# Patient Record
Sex: Male | Born: 1950 | Race: Black or African American | Hispanic: No | Marital: Married | Smoking: Never smoker
Health system: Southern US, Community
[De-identification: ages and names within clinical notes are randomized; demographics above are authoritative.]

## PROBLEM LIST (undated history)

## (undated) DIAGNOSIS — L738 Other specified follicular disorders: Secondary | ICD-10-CM

## (undated) DIAGNOSIS — D649 Anemia, unspecified: Secondary | ICD-10-CM

## (undated) DIAGNOSIS — R945 Abnormal results of liver function studies: Secondary | ICD-10-CM

## (undated) DIAGNOSIS — J309 Allergic rhinitis, unspecified: Secondary | ICD-10-CM

## (undated) DIAGNOSIS — D869 Sarcoidosis, unspecified: Secondary | ICD-10-CM

## (undated) DIAGNOSIS — I10 Essential (primary) hypertension: Secondary | ICD-10-CM

## (undated) DIAGNOSIS — N401 Enlarged prostate with lower urinary tract symptoms: Secondary | ICD-10-CM

## (undated) DIAGNOSIS — E010 Iodine-deficiency related diffuse (endemic) goiter: Secondary | ICD-10-CM

## (undated) DIAGNOSIS — M5010 Cervical disc disorder with radiculopathy, unspecified cervical region: Secondary | ICD-10-CM

## (undated) DIAGNOSIS — M1990 Unspecified osteoarthritis, unspecified site: Secondary | ICD-10-CM

## (undated) DIAGNOSIS — N400 Enlarged prostate without lower urinary tract symptoms: Secondary | ICD-10-CM

## (undated) DIAGNOSIS — N529 Male erectile dysfunction, unspecified: Secondary | ICD-10-CM

## (undated) DIAGNOSIS — R351 Nocturia: Secondary | ICD-10-CM

## (undated) DIAGNOSIS — N138 Other obstructive and reflux uropathy: Secondary | ICD-10-CM

## (undated) DIAGNOSIS — M5430 Sciatica, unspecified side: Secondary | ICD-10-CM

## (undated) DIAGNOSIS — J189 Pneumonia, unspecified organism: Secondary | ICD-10-CM

## (undated) HISTORY — DX: Anemia, unspecified: D64.9

## (undated) HISTORY — DX: Pneumonia, unspecified organism: J18.9

## (undated) HISTORY — DX: Other specified follicular disorders: L73.8

## (undated) HISTORY — DX: Allergic rhinitis, unspecified: J30.9

## (undated) HISTORY — DX: Sarcoidosis, unspecified: D86.9

## (undated) HISTORY — DX: Cervical disc disorder with radiculopathy, unspecified cervical region: M50.10

## (undated) HISTORY — DX: Benign prostatic hyperplasia with lower urinary tract symptoms: N40.1

## (undated) HISTORY — PX: FOOT SURGERY: SHX648

## (undated) HISTORY — DX: Sciatica, unspecified side: M54.30

## (undated) HISTORY — DX: Unspecified osteoarthritis, unspecified site: M19.90

## (undated) HISTORY — DX: Abnormal results of liver function studies: R94.5

## (undated) HISTORY — DX: Benign prostatic hyperplasia without lower urinary tract symptoms: N40.0

## (undated) HISTORY — DX: Nocturia: R35.1

## (undated) HISTORY — DX: Male erectile dysfunction, unspecified: N52.9

## (undated) HISTORY — DX: Other obstructive and reflux uropathy: N13.8

## (undated) HISTORY — DX: Essential (primary) hypertension: I10

## (undated) HISTORY — DX: Iodine-deficiency related diffuse (endemic) goiter: E01.0

## (undated) HISTORY — PX: KNEE ARTHROSCOPY: SHX127

---

## 1998-10-10 ENCOUNTER — Encounter: Payer: Self-pay | Admitting: Pulmonary Disease

## 1998-10-10 ENCOUNTER — Ambulatory Visit (HOSPITAL_COMMUNITY): Admission: RE | Admit: 1998-10-10 | Discharge: 1998-10-10 | Payer: Self-pay | Admitting: Pulmonary Disease

## 1999-05-05 ENCOUNTER — Encounter: Payer: Self-pay | Admitting: *Deleted

## 1999-05-05 ENCOUNTER — Emergency Department (HOSPITAL_COMMUNITY): Admission: EM | Admit: 1999-05-05 | Discharge: 1999-05-05 | Payer: Self-pay | Admitting: *Deleted

## 1999-08-13 ENCOUNTER — Encounter: Payer: Self-pay | Admitting: Internal Medicine

## 2000-05-19 ENCOUNTER — Ambulatory Visit: Admission: RE | Admit: 2000-05-19 | Discharge: 2000-05-19 | Payer: Self-pay | Admitting: Family Medicine

## 2000-12-31 ENCOUNTER — Emergency Department (HOSPITAL_COMMUNITY): Admission: EM | Admit: 2000-12-31 | Discharge: 2000-12-31 | Payer: Self-pay | Admitting: Emergency Medicine

## 2000-12-31 ENCOUNTER — Encounter: Payer: Self-pay | Admitting: Emergency Medicine

## 2001-01-01 ENCOUNTER — Encounter: Payer: Self-pay | Admitting: Internal Medicine

## 2001-01-05 ENCOUNTER — Encounter: Payer: Self-pay | Admitting: Internal Medicine

## 2001-01-07 ENCOUNTER — Encounter: Payer: Self-pay | Admitting: Urology

## 2001-01-11 ENCOUNTER — Ambulatory Visit (HOSPITAL_COMMUNITY): Admission: RE | Admit: 2001-01-11 | Discharge: 2001-01-11 | Payer: Self-pay | Admitting: Urology

## 2001-01-21 ENCOUNTER — Encounter: Payer: Self-pay | Admitting: Pulmonary Disease

## 2001-01-21 ENCOUNTER — Ambulatory Visit (HOSPITAL_COMMUNITY): Admission: RE | Admit: 2001-01-21 | Discharge: 2001-01-21 | Payer: Self-pay | Admitting: Pulmonary Disease

## 2001-02-01 ENCOUNTER — Encounter (INDEPENDENT_AMBULATORY_CARE_PROVIDER_SITE_OTHER): Payer: Self-pay | Admitting: Specialist

## 2001-02-01 ENCOUNTER — Ambulatory Visit (HOSPITAL_BASED_OUTPATIENT_CLINIC_OR_DEPARTMENT_OTHER): Admission: RE | Admit: 2001-02-01 | Discharge: 2001-02-01 | Payer: Self-pay | Admitting: Surgery

## 2001-04-05 ENCOUNTER — Ambulatory Visit: Admission: RE | Admit: 2001-04-05 | Discharge: 2001-04-05 | Payer: Self-pay | Admitting: Pulmonary Disease

## 2002-12-27 ENCOUNTER — Ambulatory Visit (HOSPITAL_BASED_OUTPATIENT_CLINIC_OR_DEPARTMENT_OTHER): Admission: RE | Admit: 2002-12-27 | Discharge: 2002-12-27 | Payer: Self-pay | Admitting: Orthopedic Surgery

## 2003-01-20 ENCOUNTER — Encounter: Payer: Self-pay | Admitting: Internal Medicine

## 2003-02-22 ENCOUNTER — Ambulatory Visit (HOSPITAL_COMMUNITY): Admission: RE | Admit: 2003-02-22 | Discharge: 2003-02-22 | Payer: Self-pay | Admitting: *Deleted

## 2003-02-22 ENCOUNTER — Encounter: Payer: Self-pay | Admitting: Internal Medicine

## 2003-12-28 ENCOUNTER — Ambulatory Visit (HOSPITAL_COMMUNITY): Admission: RE | Admit: 2003-12-28 | Discharge: 2003-12-28 | Payer: Self-pay | Admitting: Orthopedic Surgery

## 2006-10-01 ENCOUNTER — Emergency Department (HOSPITAL_COMMUNITY): Admission: EM | Admit: 2006-10-01 | Discharge: 2006-10-01 | Payer: Self-pay | Admitting: Emergency Medicine

## 2006-11-11 ENCOUNTER — Ambulatory Visit (HOSPITAL_BASED_OUTPATIENT_CLINIC_OR_DEPARTMENT_OTHER): Admission: RE | Admit: 2006-11-11 | Discharge: 2006-11-11 | Payer: Self-pay | Admitting: Orthopedic Surgery

## 2007-01-01 ENCOUNTER — Encounter: Payer: Self-pay | Admitting: Internal Medicine

## 2007-01-05 ENCOUNTER — Encounter: Admission: RE | Admit: 2007-01-05 | Discharge: 2007-01-05 | Payer: Self-pay | Admitting: Family Medicine

## 2007-01-12 ENCOUNTER — Encounter: Admission: RE | Admit: 2007-01-12 | Discharge: 2007-01-12 | Payer: Self-pay | Admitting: Family Medicine

## 2008-01-17 ENCOUNTER — Encounter: Payer: Self-pay | Admitting: Internal Medicine

## 2008-07-19 ENCOUNTER — Encounter: Payer: Self-pay | Admitting: Internal Medicine

## 2008-08-02 ENCOUNTER — Encounter: Admission: RE | Admit: 2008-08-02 | Discharge: 2008-08-02 | Payer: Self-pay | Admitting: Family Medicine

## 2008-08-31 ENCOUNTER — Encounter: Payer: Self-pay | Admitting: Internal Medicine

## 2010-01-02 ENCOUNTER — Ambulatory Visit: Payer: Self-pay | Admitting: Internal Medicine

## 2010-01-02 DIAGNOSIS — D869 Sarcoidosis, unspecified: Secondary | ICD-10-CM

## 2010-01-02 DIAGNOSIS — M1990 Unspecified osteoarthritis, unspecified site: Secondary | ICD-10-CM

## 2010-01-02 DIAGNOSIS — I10 Essential (primary) hypertension: Secondary | ICD-10-CM | POA: Insufficient documentation

## 2010-01-02 DIAGNOSIS — N529 Male erectile dysfunction, unspecified: Secondary | ICD-10-CM

## 2010-01-02 HISTORY — DX: Male erectile dysfunction, unspecified: N52.9

## 2010-01-03 ENCOUNTER — Ambulatory Visit: Payer: Self-pay | Admitting: Internal Medicine

## 2010-01-09 LAB — CONVERTED CEMR LAB
Basophils Absolute: 0.1 10*3/uL (ref 0.0–0.1)
Basophils Relative: 0.8 % (ref 0.0–3.0)
CO2: 30 meq/L (ref 19–32)
Calcium: 9.3 mg/dL (ref 8.4–10.5)
Chloride: 108 meq/L (ref 96–112)
Eosinophils Absolute: 0.3 10*3/uL (ref 0.0–0.7)
Glucose, Bld: 119 mg/dL — ABNORMAL HIGH (ref 70–99)
HCT: 43.2 % (ref 39.0–52.0)
HDL: 63.2 mg/dL (ref >39.00)
Hemoglobin: 14 g/dL (ref 13.0–17.0)
Lymphs Abs: 1.6 10*3/uL (ref 0.7–4.0)
MCHC: 32.3 g/dL (ref 30.0–36.0)
MCV: 87 fL (ref 78.0–100.0)
Monocytes Absolute: 0.6 10*3/uL (ref 0.1–1.0)
Neutro Abs: 3.9 10*3/uL (ref 1.4–7.7)
PSA: 0.29 ng/mL (ref 0.10–4.00)
RBC: 4.97 M/uL (ref 4.22–5.81)
RDW: 12.3 % (ref 11.5–14.6)
Sodium: 143 meq/L (ref 135–145)
Total CHOL/HDL Ratio: 3

## 2010-01-17 ENCOUNTER — Telehealth (INDEPENDENT_AMBULATORY_CARE_PROVIDER_SITE_OTHER): Payer: Self-pay | Admitting: *Deleted

## 2010-01-23 ENCOUNTER — Ambulatory Visit: Payer: Self-pay | Admitting: Internal Medicine

## 2010-01-25 LAB — CONVERTED CEMR LAB
BUN: 27 mg/dL — ABNORMAL HIGH (ref 6–23)
Calcium: 8.9 mg/dL (ref 8.4–10.5)
GFR calc non Af Amer: 66.92 mL/min (ref >60)
Glucose, Bld: 90 mg/dL (ref 70–99)
Sodium: 141 meq/L (ref 135–145)

## 2010-01-31 ENCOUNTER — Ambulatory Visit: Payer: Self-pay | Admitting: Internal Medicine

## 2010-02-19 ENCOUNTER — Ambulatory Visit: Payer: Self-pay | Admitting: Internal Medicine

## 2010-02-25 LAB — CONVERTED CEMR LAB
Calcium: 8.9 mg/dL (ref 8.4–10.5)
Creatinine, Ser: 1.3 mg/dL (ref 0.4–1.5)
Sodium: 141 meq/L (ref 135–145)

## 2010-03-05 ENCOUNTER — Telehealth (INDEPENDENT_AMBULATORY_CARE_PROVIDER_SITE_OTHER): Payer: Self-pay | Admitting: *Deleted

## 2010-03-05 DIAGNOSIS — J309 Allergic rhinitis, unspecified: Secondary | ICD-10-CM

## 2010-03-05 HISTORY — DX: Allergic rhinitis, unspecified: J30.9

## 2010-03-07 ENCOUNTER — Ambulatory Visit: Payer: Self-pay | Admitting: Internal Medicine

## 2010-03-07 LAB — CONVERTED CEMR LAB
Bilirubin Urine: NEGATIVE
Glucose, Urine, Semiquant: NEGATIVE
Ketones, urine, test strip: NEGATIVE
Specific Gravity, Urine: 1.02
pH: 5

## 2010-03-08 LAB — CONVERTED CEMR LAB
Chlamydia, Swab/Urine, PCR: NEGATIVE
GC Probe Amp, Urine: NEGATIVE

## 2010-03-14 ENCOUNTER — Encounter (INDEPENDENT_AMBULATORY_CARE_PROVIDER_SITE_OTHER): Payer: Self-pay | Admitting: *Deleted

## 2010-08-30 ENCOUNTER — Telehealth (INDEPENDENT_AMBULATORY_CARE_PROVIDER_SITE_OTHER): Payer: Self-pay | Admitting: *Deleted

## 2010-08-30 ENCOUNTER — Encounter (INDEPENDENT_AMBULATORY_CARE_PROVIDER_SITE_OTHER): Payer: Self-pay | Admitting: *Deleted

## 2010-09-06 ENCOUNTER — Ambulatory Visit: Payer: Self-pay | Admitting: Internal Medicine

## 2010-09-06 DIAGNOSIS — R209 Unspecified disturbances of skin sensation: Secondary | ICD-10-CM

## 2010-09-23 ENCOUNTER — Encounter: Payer: Self-pay | Admitting: Internal Medicine

## 2010-10-02 ENCOUNTER — Telehealth: Payer: Self-pay | Admitting: Internal Medicine

## 2010-10-03 ENCOUNTER — Ambulatory Visit: Payer: Self-pay | Admitting: Internal Medicine

## 2010-10-13 ENCOUNTER — Encounter
Admission: RE | Admit: 2010-10-13 | Discharge: 2010-10-13 | Payer: Self-pay | Source: Home / Self Care | Attending: Physical Medicine and Rehabilitation | Admitting: Physical Medicine and Rehabilitation

## 2010-11-24 LAB — CONVERTED CEMR LAB
AST: 36 units/L
BUN: 18 mg/dL
BUN: 27 mg/dL
CO2, serum: 26 mmol/L
CO2, serum: 30 mmol/L
Calcium, Ion: 9.4 mg/dL
Chloride, Serum: 104 mmol/L
Creatinine, Ser: 1.3 mg/dL
Creatinine, Ser: 1.6 mg/dL
Glucose, 2 hr postprandial: 97 mg/dL
HDL: 64 mg/dL
Potassium, serum: 4 mmol/L
Sodium, serum: 139 mmol/L
Total Protein: 7 g/dL
Triglyceride fasting, serum: 123 mg/dL

## 2010-11-26 NOTE — Letter (Signed)
Summary: [facility] @ [facility] [place]   Imported By: Lanelle Bal 01/14/2010 13:11:35  _____________________________________________________________________  External Attachment:    Type:   Image     Comment:   External Document

## 2010-11-26 NOTE — Miscellaneous (Signed)
Summary: colonoscopy  2004 normal, next 10 years   Clinical Lists Changes  Observations: Added new observation of COLONRECACT: Repeat colonoscopy in 10 years.   (02/22/2003 13:40) Added new observation of COLONOSCOPY: Results: Normal.  (02/22/2003 13:40)      Colonoscopy  Procedure date:  02/22/2003  Findings:      Results: Normal.   Colonoscopy  Procedure date:  02/22/2003  Comments:      Repeat colonoscopy in 10 years.     Colonoscopy  Procedure date:  02/22/2003  Findings:      Results: Normal.   Colonoscopy  Procedure date:  02/22/2003  Comments:      Repeat colonoscopy in 10 years.

## 2010-11-26 NOTE — Letter (Signed)
[facility]   Imported By: Lanelle Bal 03/21/2010 11:03:18  _____________________________________________________________________  External Attachment:    Type:   Image     Comment:   External Document

## 2010-11-26 NOTE — Letter (Signed)
Summary: [facility]  [facility]   Imported By: Lanelle Bal 01/14/2010 13:13:12  _____________________________________________________________________  External Attachment:    Type:   Image     Comment:   External Document

## 2010-11-26 NOTE — Progress Notes (Signed)
Summary: loratadine  Phone Note Call from Patient Call back at [phone]   Caller: Hulan Fray Summary of Call: --pt has allergies forgot to mention at new pt office visit 01/02/10.  Rx not on med list   and request for Loratadine has [facility].     Initial call taken by: Kandice Hams,  Mar 05, 2010 3:01 PM  New Problems: ALLERGIC RHINITIS CAUSE UNSPECIFIED (ICD-477.9)   New Problems: ALLERGIC RHINITIS CAUSE UNSPECIFIED (ICD-477.9) New/Updated Medications: LORATADINE 10 MG TABS (LORATADINE) 1 by mouth once daily

## 2010-11-26 NOTE — Procedures (Signed)
Summary: Colonoscopy----normal, 10 years   Colonoscopy/[facility]   Imported By: Lanelle Bal 01/14/2010 13:14:11  _____________________________________________________________________  External Attachment:    Type:   Image     Comment:   External Document

## 2010-11-26 NOTE — Assessment & Plan Note (Signed)
Summary: BP CHECK/CDJ  Nurse Visit   Vital Signs:  Patient profile:   60 year old male Height:      71.5 inches Weight:      250 pounds BMI:     34.51 Pulse rate:   70 / minute BP sitting:   130 / 70  Vitals Entered By: Shary Decamp (February 19, 2010 2:37 PM)  Impression & Recommendations:  Problem # 1:  HYPERTENSION (ICD-401.9) BP today stable, BMP sent His updated medication list for this problem includes:    Amlodipine Besy-benazepril Hcl 5-20 Mg Caps (Amlodipine besy-benazepril hcl) .Marland Kitchen... 1 by mouth once daily  Orders: Specimen Handling (16109) TLB-BMP (Basic Metabolic Panel-BMET) (80048-METABOL)  BP today: 130/70 Prior BP: 118/80 (01/31/2010)  Labs Reviewed: K+: 4.2 (01/23/2010) Creat: : 1.4 (01/23/2010)   Chol: 197 (01/03/2010)   HDL: 63.20 (01/03/2010)   LDL: 110 (01/03/2010)   TG: 117.0 (01/03/2010)  Complete Medication List: 1)  Cialis 10 Mg Tabs (Tadalafil) .Marland Kitchen.. 1 or 2 tabs   every other day as needed 2)  Amlodipine Besy-benazepril Hcl 5-20 Mg Caps (Amlodipine besy-benazepril hcl) .Marland Kitchen.. 1 by mouth once daily  CC: bp check   Allergies: No Known Drug Allergies  Orders Added: 1)  Specimen Handling [99000] 2)  TLB-BMP (Basic Metabolic Panel-BMET) [80048-METABOL] 3)  Est. Patient Level I [60454]

## 2010-11-26 NOTE — Progress Notes (Signed)
  Phone Note Call from Patient   Caller: Spouse Summary of Call: left msg on VM  , pt having signs of fatigued, extremely tired.  --Left msg for pt wife have pt give office a call Pt called back wanted to know a good vitamin to take recommend Centrum Silver complete, also if still having signs of fatigued wll need office visit .Kandice Hams  January 17, 2010 3:27 PM  Initial call taken by: Kandice Hams,  January 17, 2010 3:27 PM

## 2010-11-26 NOTE — Assessment & Plan Note (Signed)
Summary: nur bp check/alr  Nurse Visit   Vital Signs:  Patient profile:   60 year old male Weight:      250 pounds Pulse rate:   58 / minute BP sitting:   150 / 90  (left arm)  Vitals Entered By: Doristine Devoid (January 23, 2010 3:46 PM)  History of Present Illness: here for BP check,  he started Maxide few weeks ago, good medication compliance, he is however feeling quite fatigued and tired  and medication  is  making him "urinate a lot"  he try Cialis, needs refills   Review of Systems       denies neck pain, headache, chest pains   Impression & Recommendations:  Problem # 1:  HYPERTENSION (ICD-401.9) intolerant to maxzide due to fatigue (pt not having hypotension) change to lotrel nurse visit (no charge) ------ next week OV w/ me 3 weeks  The following medications were removed from the medication list:    Triamterene-hctz 37.5-25 Mg Caps (Triamterene-hctz) ..... One by mouth in the morning His updated medication list for this problem includes:    Amlodipine Besy-benazepril Hcl 5-20 Mg Caps (Amlodipine besy-benazepril hcl) .Marland Kitchen... 1 by mouth once daily  Orders: Venipuncture (29562) TLB-BMP (Basic Metabolic Panel-BMET) (80048-METABOL)  BP today: 150/90 Prior BP: 160/100 (01/02/2010)  Labs Reviewed: K+: 4.4 (01/03/2010) Creat: : 1.2 (01/03/2010)   Chol: 197 (01/03/2010)   HDL: 63.20 (01/03/2010)   LDL: 110 (01/03/2010)   TG: 117.0 (01/03/2010)  Problem # 2:  ERECTILE DYSFUNCTION, ORGANIC (ICD-607.84) needs  another Rx  His updated medication list for this problem includes:    Cialis 10 Mg Tabs (Tadalafil) .Marland Kitchen... 1 or 2 tabs   every other day as needed  Complete Medication List: 1)  Cialis 10 Mg Tabs (Tadalafil) .Marland Kitchen.. 1 or 2 tabs   every other day as needed 2)  Amlodipine Besy-benazepril Hcl 5-20 Mg Caps (Amlodipine besy-benazepril hcl) .Marland Kitchen.. 1 by mouth once daily   Physical Exam  General:  alert, well-developed, and well-nourished.   Lungs:  normal respiratory  effort, no intercostal retractions, no accessory muscle use, and slightly decreased  breath sounds.   Heart:  normal rate, regular rhythm, no murmur, and no gallop.   Extremities:  no edema  CC: bp check - bp med causing drowsiness and questions about cialis    Allergies: No Known Drug Allergies  Orders Added: 1)  Venipuncture [36415] 2)  TLB-BMP (Basic Metabolic Panel-BMET) [80048-METABOL] 3)  Est. Patient Level III [13086]  Patient Instructions: 1)  change to the new BP med 2)  call if side effects or if you feel is too strong  3)  see my nurse next week for BP check  4)  Please schedule a follow-up appointment in 3  weeks.   Prescriptions: CIALIS 10 MG TABS (TADALAFIL) 1 or 2 tabs   every other day as needed  #10 x 3   Entered and Authorized by:   Elita Quick E. Tj Kitchings MD   Signed by:   Nolon Rod. Francely Craw MD on 01/23/2010   Method used:   Print then Give to Patient   RxID:   [id number] CIALIS 10 MG TABS (TADALAFIL) 1 or 2 tabs   every other day as needed  #3 x 0   Entered and Authorized by:   Nolon Rod. Seraphim Affinito MD   Signed by:   Nolon Rod. Shataria Crist MD on 01/23/2010   Method used:   Print then Give to Patient   RxID:   [id number] AMLODIPINE  BESY-BENAZEPRIL HCL 5-20 MG CAPS (AMLODIPINE BESY-BENAZEPRIL HCL) 1 by mouth once daily  #30 x 3   Entered and Authorized by:   Elita Quick E. Samanthamarie Ezzell MD   Signed by:   Nolon Rod. Mehr Depaoli MD on 01/23/2010   Method used:   Print then Give to Patient   RxID:   [id number]

## 2010-11-26 NOTE — Assessment & Plan Note (Signed)
Summary: BP CHECK---NCS  Nurse Visit   Vital Signs:  Patient profile:   60 year old male Height:      71.5 inches Weight:      257 pounds Temp:     97.6 degrees F oral Pulse rate:   76 / minute BP sitting:   122 / 86  (left arm) BP standing:   140 / 90  Vitals Entered By: Jeremy Johann CMA (October 04, 2010 9:14 AM)  Impression & Recommendations:  Problem # 1:  HYPERTENSION (ICD-401.9)  seems  better------------------------> ask  patient to check ambulatory BPs and let us know  Needed labs, apparently not done -------------------------> Needs to come back for the following tests at his convenience BP check and BMP---dx  hypertension 4)  B12 and folic acid--- dx paresthesia 5)  HIV-VSRL --dx V01.6 His updated medication list for this problem includes:    Amlodipine Besylate 5 Mg Tabs (Amlodipine besylate) ..... One tablet daily    Losartan Potassium 100 Mg Tabs (Losartan potassium) ..... One daily  BP today: 122/86 Prior BP: 162/82 (09/06/2010)  Labs Reviewed: K+: 4.5 (02/19/2010) Creat: : 1.3 (02/19/2010)   Chol: 197 (01/03/2010)   HDL: 63.20 (01/03/2010)   LDL: 110 (01/03/2010)   TG: 117.0 (01/03/2010)  Orders: No Charge Patient Arrived (NCPA0) (NCPA0)   Pt is aware, he will call back and make an appt for his BP and labs. Army Fossa CMA  October 04, 2010 2:07 PM  Problem # 2:  PARESTHESIA (ICD-782.0)  nerve conduction study suggestive of C7 radiculopathy which is consistent with the patient's symptoms Plan: Orthopedic referral, likely needs MR   Orders: Orthopedic Referral (Ortho)  Complete Medication List: 1)  Cialis 10 Mg Tabs (Tadalafil) .Marland Kitchen.. 1 or 2 tabs   every other day as needed 2)  Loratadine 10 Mg Tabs (Loratadine) .Marland Kitchen.. 1 by mouth once daily 3)  Amlodipine Besylate 5 Mg Tabs (Amlodipine besylate) .... One tablet daily 4)  Losartan Potassium 100 Mg Tabs (Losartan potassium) .... One daily  CC: BP check   Current Medications (verified): 1)   Cialis 10 Mg Tabs (Tadalafil) .Marland Kitchen.. 1 or 2 Tabs   Every Other Day As Needed 2)  Loratadine 10 Mg Tabs (Loratadine) .Marland Kitchen.. 1 By Mouth Once Daily 3)  Amlodipine Besylate 5 Mg Tabs (Amlodipine Besylate) .... One Tablet Daily 4)  Losartan Potassium 100 Mg Tabs (Losartan Potassium) .... One Daily  Allergies (verified): No Known Drug Allergies  Orders Added: 1)  Orthopedic Referral [Ortho] 2)  No Charge Patient Arrived (NCPA0) [NCPA0]

## 2010-11-26 NOTE — Assessment & Plan Note (Signed)
Summary: BP check//lch  Nurse Visit   Vital Signs:  Patient profile:   60 year old male Height:      71.5 inches Weight:      250 pounds BMI:     34.51 Pulse rate:   82 / minute BP sitting:   118 / 80  Vitals Entered By: Shary Decamp (January 31, 2010 11:22 AM) CC: bp check Comments Patient instructed to folllow up with Dr. Drue Novel in 3 weeks Shary Decamp  January 31, 2010 11:22 AM    Impression & Recommendations:  Problem # 1:  HYPERTENSION (ICD-401.9) better, bmp in 3 weeks  His updated medication list for this problem includes:    Amlodipine Besy-benazepril Hcl 5-20 Mg Caps (Amlodipine besy-benazepril hcl) .Marland Kitchen... 1 by mouth once daily  Orders: No Charge Patient Arrived (NCPA0) (NCPA0)  BP today: 118/80 Prior BP: 150/90 (01/23/2010)  Labs Reviewed: K+: 4.2 (01/23/2010) Creat: : 1.4 (01/23/2010)   Chol: 197 (01/03/2010)   HDL: 63.20 (01/03/2010)   LDL: 110 (01/03/2010)   TG: 117.0 (01/03/2010)  Complete Medication List: 1)  Cialis 10 Mg Tabs (Tadalafil) .Marland Kitchen.. 1 or 2 tabs   every other day as needed 2)  Amlodipine Besy-benazepril Hcl 5-20 Mg Caps (Amlodipine besy-benazepril hcl) .Marland Kitchen.. 1 by mouth once daily   Allergies: No Known Drug Allergies  Orders Added: 1)  No Charge Patient Arrived (NCPA0) [NCPA0]

## 2010-11-26 NOTE — Letter (Signed)
[facility]   Imported By: Lanelle Bal 03/21/2010 11:04:36  _____________________________________________________________________  External Attachment:    Type:   Image     Comment:   External Document

## 2010-11-26 NOTE — Assessment & Plan Note (Signed)
Summary: bp is high and labs//ph   Vital Signs:  Patient profile:   60 year old male Weight:      243.13 pounds Pulse rate:   82 / minute Pulse rhythm:   regular BP sitting:   162 / 82  (left arm) Cuff size:   regular  Vitals Entered By: Army Fossa CMA (September 06, 2010 11:09 AM) CC: BP has been elevated- not fasting Comments declines flu shot [facility] [address]    History of Present Illness: here with 2 issues He checked his blood pressure yesterday and it was 162/107. Has not checked his BP other times  4-5 month ago develop a tingling/numbness in the right arm. Symptoms start  at the tricipital area and goes down to the second and third finger. This type of feeling was on- off and now is more steady . no neccesarily worse w/ neck motion  Review of systems Denies chest pain No headache, slurred speech, motor deficits admits to some discomfort in the base of the right side of the neck. No actual pain no lower extremity edema No cough diet is ad  libidum  Current Medications (verified): 1)  Cialis 10 Mg Tabs (Tadalafil) .Marland Kitchen.. 1 or 2 Tabs   Every Other Day As Needed 2)  Amlodipine Besy-Benazepril Hcl 5-20 Mg Caps (Amlodipine Besy-Benazepril Hcl) .Marland Kitchen.. 1 By Mouth Once Daily 3)  Loratadine 10 Mg Tabs (Loratadine) .Marland Kitchen.. 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 knee scope rt x 2, left x 1  Social History: Reviewed history from 01/02/2010 and no changes required. Married 2 children tobacco--no ETOH--no Works w/ the [employer] [place] diet--not healthy most of the time  exercise-- active , no regular exercise   Physical Exam  General:  alert and well-developed.   Neck:  neck is full range of motion, some muscle spasm of the right  trapezoid Lungs:  normal respiratory effort, no intercostal retractions, no accessory muscle use, and slightly decreased  breath sounds.   Heart:  normal rate, regular rhythm, no murmur, and no gallop.   Extremities:  no edema Neurologic:  alert & oriented X3, strength normal in all extremities, sensation intact to pinprick, gait normal, and DTRs symmetrical and normal.     Impression & Recommendations:  Problem # 1:  HYPERTENSION (ICD-401.9) needs better control change from Amlodipine Besy-benazepril Hcl 5-20 Mg Caps  to amlodipine and losartan see instructions  His updated medication list for this problem includes:   Amlodipine Besy-benazepril Hcl 5-20 Mg Caps  (Amlodipine besy-benazepril hcl) .Marland Kitchen... 1 by mouth once daily  BP today: 162/82 Prior BP: 140/92 (03/07/2010)  Labs Reviewed: K+: 4.5 (02/19/2010) Creat: : 1.3 (02/19/2010)   Chol: 197 (01/03/2010)   HDL: 63.20 (01/03/2010)   LDL: 110 (01/03/2010)   TG: 117.0 (01/03/2010)  Problem # 2:  PARESTHESIA (ICD-782.0) Assessment: New  upper extremity paresthesia with some neck discomfort. Suspect radiculopathy. recent RPR and HIV negative Plan: Labs and nerve conduction study see Instructions  Orders: Neurology Referral (Neuro)  Problem # 3:  SEXUALLY TRANSMITTED DISEASE, EXPOSURE TO (ICD-V01.6) needs (and requests)  labs to be repeated  Complete Medication List: 1)  Cialis 10 Mg Tabs (Tadalafil) .Marland Kitchen.. 1 or 2 tabs   every other day as needed 2)  Loratadine 10 Mg Tabs (Loratadine) .Marland KitchenMarland KitchenMarland Kitchen  1 by mouth once daily 3)  Amlodipine Besylate 5 Mg Tabs (Amlodipine besylate) .... One tablet daily 4)  Losartan Potassium 100 Mg Tabs (Losartan potassium) .... One daily  Patient Instructions: 1)  take  blood pressure medication as prescribed 2)  came  back in 3 weeks for a nurse visit -- 3)  BP check and BMP---dx  hypertension 4)  B12 and folic acid--- dx paresthesia 5)  HIV-VSRL --dx V01.6 6)  Please schedule a follow-up appointment  in 4 months .  Prescriptions: LOSARTAN POTASSIUM 100 MG TABS (LOSARTAN POTASSIUM) One daily  #30 x 3   Entered and Authorized by:   Elita Quick E. Tymika Grilli MD   Signed by:   Nolon Rod. Mattalynn Crandle MD on 09/06/2010   Method used:   [facility]  [address] [id number]       [address]       [place], [place]  [address]       Ph: [phone] or [phone]       Fax: [phone]   RxID:   [id number] AMLODIPINE BESYLATE 5 MG TABS (AMLODIPINE BESYLATE) one tablet daily  #30 x 3   Entered and Authorized by:   Nolon Rod. Tura Roller MD   Signed by:   Nolon Rod. Jaliel Deavers MD on 09/06/2010   Method used:   [facility]        [facility]  [address] [id number]       [address]       [place], [place]  [address]       Ph: [phone] or [phone]       Fax: [phone]   RxID:   [id number]    Orders Added: 1)  Est. Patient Level IV [83151] 2)  Neurology Referral [Neuro]

## 2010-11-26 NOTE — Letter (Signed)
Summary: [facility] @ [facility] [place]   Imported By: Lanelle Bal 01/14/2010 13:10:49  _____________________________________________________________________  External Attachment:    Type:   Image     Comment:   External Document

## 2010-11-26 NOTE — Assessment & Plan Note (Signed)
Summary: ncpx- jr   Vital Signs:  Patient profile:   60 year old male Height:      71.5 inches Weight:      250 pounds BMI:     34.51 Pulse rate:   94 / minute BP sitting:   160 / 100  Vitals Entered By: Shary Decamp (January 02, 2010 1:17 PM) CC: new pt cpx Comments  - pt has been treated for urinary incontinence & HTN in the past -- pt does not know the name of the meds  - per pt colonoscopy UTD Shary Decamp  January 02, 2010 1:20 PM    History of Present Illness: NEW PT CPX   pt has been treated for urinary  problems in the past :urgency, frecuency symptoms going on x years , OTC prostate meds help some  HTN in the past, admits he has a hard time taking meds   Current Medications (verified): 1)  None  Allergies (verified): No Known Drug Allergies  Past History:  Past Medical History: Hypertension Osteoarthritis 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 CV:  Denies chest pain or discomfort and swelling of feet. Resp:  Denies cough, shortness of breath, sputum productive, and wheezing. GI:  Denies bloody stools, diarrhea, nausea, and vomiting. GU:  Denies dysuria and hematuria. Psych:  Denies anxiety and depression.  Physical Exam  General:  alert and well-developed.   Neck:  no masses, no thyromegaly, and normal carotid upstroke.   Lungs:  normal respiratory effort, no intercostal retractions, no accessory muscle use, and slightly decreased  breath sounds.   Heart:  normal rate, regular rhythm, no murmur, and no  gallop.   Abdomen:  soft, non-tender, no distention, no masses, no guarding, and no rigidity.   Rectal:  No external abnormalities noted. Normal sphincter tone. No rectal masses or tenderness. Hemoccult negative Prostate:  Prostate gland firm and smooth, no enlargement, nodularity, tenderness, mass, asymmetry or induration. Psych:  Oriented X3, memory intact for recent and remote, normally interactive, good eye contact, not anxious appearing, and not depressed appearing.     Impression & Recommendations:  Problem # 1:  ROUTINE GENERAL MEDICAL EXAM@HEALTH  CARE FACL (ICD-V70.0) Td today per pt:  colonoscopy approximately 4 years ago at the hospital, no polys labs EKG sinus rhythm, no acute get medical records counseled: diet-exercise  has some BPH symptoms, DRE normal, PSA pending.  Consider Flomax  Problem # 2:  HYPERTENSION (ICD-401.9) patient has a history of hypertension, BP is elevated today after labs,  rec.  to start Maxzide risks of uncontrolled hypertension discussed  BP today: 160/100  His updated medication list for this problem includes:    Triamterene-hctz 37.5-25 Mg Caps (Triamterene-hctz) ..... One by mouth in the morning  Orders: EKG w/ Interpretation (93000)  Problem # 3:  Hx of SARCOIDOSIS (ICD-135) currently asymptomatic, breath sounds are slightly decreased bilaterally, will get old records  Problem # 4:  ERECTILE DYSFUNCTION, ORGANIC (ICD-607.84) before he left, she  complained of ED in the past Viagra cause a runny nose but Cialis was better tolerated I don'ts  see a contraindication to try Cialis, samples and a 3 tablet prescription provided His updated medication list for this problem includes:    Cialis 10 Mg Tabs (Tadalafil) .Marland Kitchen... 1 or 2 tabs   every other day as needed  Medications Added to Medication List This Visit: 1)  Triamterene-hctz 37.5-25 Mg Caps (Triamterene-hctz) .... One by mouth in the morning 2)  Cialis 10 Mg Tabs (Tadalafil) .Marland Kitchen.. 1 or 2  tabs   every other day as needed  Other Orders: Tdap => 18yrs IM ([address]) Admin 1st Vaccine (82956)  Patient Instructions: 1)  came back fasting: 2)  FLP,  BMP, CBC, AST, ALT, TSH, PSA ---- dx  V70 3)  start Maxzide after the  blood test 4)  see my nurse in 3 weeks for a BP check and BMP DX hypertension 5)  Please schedule a follow-up appointment in 3 months .  Prescriptions: CIALIS 10 MG TABS (TADALAFIL) 1 or 2 tabs   every other day as needed  #3 x 0   Entered and Authorized by:   Nolon Rod. Paz MD   Signed by:   Nolon Rod. Paz MD on 01/02/2010   Method used:   Print then Give to Patient   RxID:   [id number] TRIAMTERENE-HCTZ 37.5-25 MG CAPS (TRIAMTERENE-HCTZ) one by mouth in the morning  #30 x 3   Entered and Authorized by:   Nolon Rod. Paz MD   Signed by:   Nolon Rod. Paz MD on 01/02/2010   Method used:   Print then Give to Patient   RxID:   [id number]    Immunizations Administered:  Tetanus Vaccine:    Vaccine Type: Tdap    Site: left deltoid    Mfr: GlaxoSmithKline    Dose: 0.5 ml    Route: IM    Given by: Shary Decamp    Exp. Date: 12/22/2011    Lot #: [id number]

## 2010-11-26 NOTE — Miscellaneous (Signed)
Summary: lab results from previous MD  Clinical Lists Changes  Observations: Added new observation of ALBUMIN: 3.9 g/dL (14/78/2956 21:30) Added new observation of CALCIUM ION.: 9.7 mg/dL (86/57/8469 62:95) Added new observation of BG 2H PP: 97 mg/dL (28/41/3244 01:02) Added new observation of CREATININE: 1.30 mg/dL (72/53/6644 03:47) Added new observation of BUN: 18 mg/dL (42/59/5638 75:64) Added new observation of CO2 TOTAL: 30 mmol/L (07/19/2008 13:45) Added new observation of CHLORIDE: 104 mmol/L (07/19/2008 13:45) Added new observation of POTASSIUM: 4.0 mmol/L (07/19/2008 13:45) Added new observation of SODIUM: 140 mmol/L (07/19/2008 13:45)      Chemistry Labs Test Date: 07/19/2008                      Value Units        H/L   Reference  Sodium:             140   mmol/L             (137-145) Potassium:          4.0   mmol/L             (3.6-5.0) Chloride:           104   mmol/L             (101-111) CO2:                30    mmol/L             (22-31) BUN:                18    mg/dL              (3-32) Creatinine:         1.30  mg/dL              (9.5-1.8) Glucose-2hrPP:      97    mg/dL              (84-166) Calcium (ionized):  9.7   mg/dL         H    (2-4) Albumin:            3.9   g/dL               (3-5)  Lab name:           [place] [facility]

## 2010-11-26 NOTE — Miscellaneous (Signed)
Summary: LABS FROM PREVIOUS MD  Clinical Lists Changes  Observations: Added new observation of SGPT (ALT): 50 units/L (01/17/2008 13:47) Added new observation of SGOT (AST): 36 units/L (01/17/2008 13:47) Added new observation of TRIGLYCERIDE: 123 mg/dL (62/95/2841 32:44) Added new observation of HDL: 64 mg/dL (10/29/7251 66:44) Added new observation of LDL: 119 mg/dL (03/47/4259 56:38) Added new observation of CHOLESTEROL: 208 mg/dL (75/64/3329 51:88) Added new observation of PROTEIN, TOT: 7.0 g/dL (41/66/0630 16:01) Added new observation of ALBUMIN: 3.8 g/dL (09/32/3557 32:20) Added new observation of BILI TOTAL: 1.2 mg/dL (25/42/7062 37:62) Added new observation of ALK PHOS: 65 units/L (01/17/2008 13:47) Added new observation of CALCIUM ION.: 9.4 mg/dL (83/15/1761 60:73) Added new observation of BG RANDOM: 105 mg/dL (71/03/2693 85:46) Added new observation of CREATININE: 1.60 mg/dL (27/12/5007 38:18) Added new observation of BUN: 27 mg/dL (29/93/7169 67:89) Added new observation of CO2 TOTAL: 26 mmol/L (01/17/2008 13:47) Added new observation of CHLORIDE: 105 mmol/L (01/17/2008 13:47) Added new observation of POTASSIUM: 4.0 mmol/L (01/17/2008 13:47) Added new observation of SODIUM: 139 mmol/L (01/17/2008 13:47)      Chemistry Labs Test Date: 01/17/2008                      Value Units        H/L   Reference  Sodium:             139   mmol/L             (137-145) Potassium:          4.0   mmol/L             (3.6-5.0) Chloride:           105   mmol/L             (101-111) CO2:                26    mmol/L             (22-31) BUN:                27    mg/dL         H    (3-81) Creatinine:         1.60  mg/dL         H    (0.1-7.5) Glucose-random:     105   mg/dL              (10-258) Calcium (ionized):  9.4   mg/dL         H    (2-4) Alkaline P'tase:    65    U/L                (10-120) T. Bili:            1.2   mg/dL              (5.2-7.7) Albumin:            3.8   g/dL                (3-5) Total Protein:      7.0   g/dL               (4-7)  Lab name:           [facility]    Lipid Panel Test Date: 01/17/2008  Value        Units        H/L   Reference  Cholesterol:          208          mg/dL              (409-811) LDL Cholesterol:      119          mg/dL              (91-478) HDL Cholesterol:      64           mg/dL              (29-56) Triglyceride:         123          mg/dL              (21-308) SGOT (AST)            36                              SGPT (ALT)            50                               Lab name:             [facility]

## 2010-11-26 NOTE — Progress Notes (Signed)
Summary: labs/letter sent  ---- Converted from flag ---- ---- 08/30/2010 9:10 AM, Nolon Rod. Paz MD wrote: yes please   ---- 08/28/2010 4:06 PM, Army Fossa CMA wrote: Pt is due for HIV, RPR- Phone # disconnected- do you want me to send him a letter stating he is due for labs? ------------------------------  Mailed pt a letter. Army Fossa CMA  August 30, 2010 9:33 AM

## 2010-11-26 NOTE — Letter (Signed)
Summary: Primary Care Appointment Letter  [facility]  [address] [place], [place] [address]   Phone: [phone]  Fax: [phone]    08/30/2010 MRN: [id number]  Shemuel Strack [address] [place], [place]  [address]  Dear Mr. Wysocki,   Your Primary Care Physician [place] E. Paz MD has indicated that:    _______it is time to schedule an appointment.    _______you missed your appointment on______ and need to call and          reschedule.    __X_____you need to have lab work done.    _______you need to schedule an appointment discuss lab or test results.    _______you need to call to reschedule your appointment that is                       scheduled on _________.     Please call our office as soon as possible. Our phone number is [phone]________. Please press option 1. Our office is open 8a-12noon and 1p-5p, Monday through Friday.     Thank you,    [facility]

## 2010-11-26 NOTE — Miscellaneous (Signed)
  Clinical Lists Changes  Problems: Added new problem of UROLITHIASIS, HX OF (ICD-V13.01) Observations: Added new observation of PAST MED HX: Hypertension Osteoarthritis h/o Sarcoidosis dx in the 90s to early 2000s (Dr Sung Amabile) Current Problems:  UROLITHIASIS, HX OF (ICD-V13.01) ? 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

## 2010-11-26 NOTE — Progress Notes (Signed)
Summary: Results/High BP  Phone Note Call from Patient   Summary of Call: Patient spouse left message on triage that they need neuro results and that his BP is still high. Please call the patient at [phone]   Patient was a no show for bp check on friday, left message on voicemail to call back to office.  Initial call taken by: Lucious Groves CMA,  October 02, 2010 3:41 PM  Follow-up for Phone Call        Patient notified. Follow-up by: Lucious Groves CMA,  October 03, 2010 9:14 AM

## 2010-11-26 NOTE — Letter (Signed)
Summary: Problem List/[facility]  Problem List/[facility]   Imported By: Lanelle Bal 01/14/2010 13:12:27  _____________________________________________________________________  External Attachment:    Type:   Image     Comment:   External Document

## 2010-11-26 NOTE — Assessment & Plan Note (Signed)
Summary: confidential/pt would not discuss/alr   Vital Signs:  Patient profile:   60 year old male Height:      71.5 inches Weight:      250 pounds BMI:     34.51 Pulse rate:   72 / minute BP sitting:   140 / 92  Vitals Entered By: Shary Decamp (Mar 07, 2010 10:29 AM) CC: ? exposure to STD (trich),  asymptomatic   History of Present Illness: sexually active 2 days ago w/o condom the lady called him later and told patient that she was just dx w/  trichomona  Current Medications (verified): 1)  Cialis 10 Mg Tabs (Tadalafil) .Marland Kitchen.. 1 or 2 Tabs   Every Other Day As Needed 2)  Amlodipine Besy-Benazepril Hcl 5-20 Mg Caps (Amlodipine Besy-Benazepril Hcl) .Marland Kitchen.. 1 By Mouth Once Daily 3)  Loratadine 10 Mg Tabs (Loratadine) .Marland Kitchen.. 1 By Mouth Once Daily  Allergies (verified): No Known Drug Allergies  Review of Systems       no penile d/c  no dysuria  no genital rash no hematuria   Physical Exam  General:  alert and well-developed.   Abdomen:  no inguinal hernia.   Genitalia:  uncircumcised, no hydrocele, no varicocele, no scrotal masses, no testicular masses or atrophy, no cutaneous lesions, and no urethral discharge.     Impression & Recommendations:  Problem # 1:  ? 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 avoid contact w/ his regular partner x 7 days  call if any symptoms surface   Complete Medication List: 1)  Cialis 10 Mg Tabs (Tadalafil) .Marland Kitchen.. 1 or 2 tabs   every other day as needed 2)  Amlodipine Besy-benazepril Hcl 5-20 Mg Caps (Amlodipine besy-benazepril hcl) .Marland Kitchen.. 1 by mouth once daily 3)  Loratadine 10 Mg Tabs (Loratadine) .Marland Kitchen.. 1 by mouth once daily 4)  Metronidazole 500 Mg Tabs (Metronidazole) .... 4 tabs together 1 time  Other Orders: Specimen Handling (42353) T-Chlamydia  Probe, urine [phone]) T-GC Probe, urine [phone]) UA Dipstick w/o  Micro (manual) (81002) Venipuncture (26712) T-HIV Antibody  (Reflex) ([id number]) T-RPR (Syphilis) ([id number]) Prescriptions: METRONIDAZOLE 500 MG TABS (METRONIDAZOLE) 4 tabs together 1 time  #4 x 0   Entered and Authorized by:   Elita Quick E. Roshun Klingensmith MD   Signed by:   Nolon Rod. Jelina Paulsen MD on 03/07/2010   Method used:   Print then Give to Patient   RxID:   [id number]   Laboratory Results   Urine Tests    Routine Urinalysis   Glucose: negative   (Normal Range: Negative) Bilirubin: negative   (Normal Range: Negative) Ketone: negative   (Normal Range: Negative) Spec. Gravity: 1.020   (Normal Range: 1.003-1.035) Blood: negative   (Normal Range: Negative) pH: 5.0   (Normal Range: 5.0-8.0) Protein: negative   (Normal Range: Negative) Urobilinogen: 0.2   (Normal Range: 0-1) Nitrite: negative   (Normal Range: Negative) Leukocyte Esterace: negative   (Normal Range: Negative)

## 2011-01-28 ENCOUNTER — Other Ambulatory Visit: Payer: Self-pay | Admitting: Internal Medicine

## 2011-03-11 ENCOUNTER — Other Ambulatory Visit: Payer: Self-pay | Admitting: Internal Medicine

## 2011-03-14 NOTE — Op Note (Signed)
   NAME:  Levi Jones, Levi Jones                         ACCOUNT NO.:  [id number]   MEDICAL RECORD NO.:  [id number]                   PATIENT TYPE:  AMB   LOCATION:  [facility]                                  FACILITY:  [facility]   PHYSICIAN:  Leonides Grills, M.D.                  DATE OF BIRTH:  [birth year]   DATE OF PROCEDURE:  12/27/2002  DATE OF DISCHARGE:                                 OPERATIVE REPORT   PREOPERATIVE DIAGNOSIS:  Retained hardware, right foot, deep.   POSTOPERATIVE DIAGNOSIS:  Retained hardware, right foot, deep.   OPERATION:  Hardware removal, deep,_____ foot.   ANESTHESIA:  General endotracheal.   SURGEON:  Leonides Grills, M.D.   ASSISTANT:  Jeananne Rama.   ESTIMATED BLOOD LOSS:  Minimal.   TOURNIQUET:  None.   COMPLICATIONS:  None.   DISPOSITION:  Stable to the postoperative recovery room.   INDICATIONS FOR PROCEDURE:  This is a 60 year old gentleman who has had a  previous proximal osteotomy and bunionectomy in the past.  His screw started  backing out and becoming prominent and quite bothersome in his shoe.  He has  consented to the above procedure.  All risks, which include infection, nerve  or vessel injury, swelling and possible drainage from the wound, were all  explained and questions encouraged and answered.   DESCRIPTION OF PROCEDURE:  The patient was brought to the operating room and  placed in the supine position.  After adequate general endotracheal  anesthesia was administered, as well as Ancef ______.  The ____ lower  extremity was then prepped and draped in a sterile manner over a proximally-  placed thigh tourniquet which was not used.  A longitudinal incision was  made over the head of the screw, and a nick and spread technique was then  made down to the screw with retraction on either side.  The screw was then  removed.  The screw was intact throughout.  No evidence of failure.  The  wound was copiously irrigated with copious saline.  The skin was  closed with  #4-0 nylon.  A sterile dressing was applied.  The patient went stable to the postoperative recovery room.                                                Leonides Grills, M.D.    PB/MEDQ  D:  12/27/2002  T:  12/27/2002  Job:  [id number]

## 2011-03-14 NOTE — Op Note (Signed)
[facility]  Patient:    JULIAN, MEDINA                      MRN: [id number] Proc. Date: 01/08/01 Adm. Date:  [id number] Disc. Date: [id number] Attending:  Laqueta Jean                           Operative Report  This is a second dictation.  Original dictation from 01/08/01.  PREOPERATIVE DIAGNOSIS:  Left ureteral stone.  POSTOPERATIVE DIAGNOSIS:  Left ureteral stone.  OPERATION:  Cystourethroscopy, left retrograde pyelogram, ureteroscopy and basket extraction of stone.  PREPARATION:  After appropriate preanesthesia, the patient was brought to the operating room, placed on the operating table in the dorsal supine position where general mask anesthesia was introduced.  He was then replaced in the dorsal lithotomy position where the pubis was prepped with Betadine solution and draped in the usual fashion.  DESCRIPTION OF PROCEDURE:  Cystourethroscopy was accomplished, and left retrograde pyelogram was performed, which yielded a left ureteral stone. Ureteroscopy was performed, and basket extraction of stone was identified of a 6 mm left ureterovesical junction stone.  The patient did not have a double-J catheter placed.  He was awakened after given a B&O suppository and IV Toradol and taken to the recovery room in good condition. DD:  01/28/01 TD:  01/29/01 Job: [id number]

## 2011-03-14 NOTE — Op Note (Signed)
Levi Jones, Levi Jones               ACCOUNT NO.:  [id number]   MEDICAL RECORD NO.:  [id number]          PATIENT TYPE:  AMB   LOCATION:  [facility]                         FACILITY:  [facility]   PHYSICIAN:  Ollen Gross, M.D.    DATE OF BIRTH:  [birth year]   DATE OF PROCEDURE:  11/11/2006  DATE OF DISCHARGE:                               OPERATIVE REPORT   PREOPERATIVE DIAGNOSIS:  Right knee medial meniscal tear and chondral  defect, medial.   POSTOPERATIVE DIAGNOSIS:  Right knee medial meniscal tear and chondral  defect, medial.   PROCEDURE:  Right knee arthroscopy with meniscal debridement and  chondroplasty.   SURGEON:  Alusio. No assistant.   ANESTHESIA:  General.   ESTIMATED BLOOD LOSS:  Minimal.   DRAINS:  None.   COMPLICATIONS:  None.   CONDITION:  Stable to recovery.   BRIEF CLINICAL NOTE:  [name] is a 60 year old male with significant  right knee pain and mechanical symptoms. 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 He has had exam and history consistent with a  medial meniscal tear confirmed by MRI. He presents for arthroscopy and  debridement.   PROCEDURE IN DETAIL:  After successful administration of general  anesthesia, tourniquet was placed high on the right thigh and right  lower extremity prepped and draped in the usual sterile fashion.  Standard superomedial and inferolateral incisions were made with inflow  cannula passed superomedial and camera passed inferolateral.  Arthroscopic visualization proceeded. Suprapatellar area shows no loose  bodies and very mild synovitis. Under surface of the patella shows some  grade 2 chondromalacia but no full thickness defects. The trochlea,  however, is grade 4 with exposed bone throughout most of the trochlea.  Medial and lateral gutters were visualized. There were no loose bodies.  Flexion and valgus force was applied to the knee and the medial  compartment centered. The medial tibial plateau looks fine. There is a  small area of chondral defect on the medial femoral condyle. There was  also a tear in the posterior horn of the medial meniscus. We used a  spinal needle to localize inferomedial portals. Small incision was made,  and dilator was placed. The meniscus was debrided back to a stable base  with baskets and 4.2-mm shaver and sealed off with the ArthroCare  device. The medial femoral condyle has about a 1 x 1-cm area where  cartilage is coming off of the bone. This was debrided back to a stable  bony base and stable cartilaginous edges. Some of this appeared chronic  in nature, but there was also acute area where the cartilage was  delaminating. The intercondylar notch was then visualized. ACL appears  normal. Lateral compartment centered, and it looked normal. Arthroscopic  equipment was then removed from the inferior portals which were closed  with interrupted 4-0 nylon.  Twenty mL of 0.25% of Marcaine with epinephrine were injected through  the inflow cannula, and that was removed and that portal closed with  nylon. Bulky sterile dressing was applied, and he was  awakened and  transported to recovery in stable condition.      Ollen Gross, M.D.  Electronically Signed     FA/MEDQ  D:  11/11/2006  T:  11/11/2006  Job:  [id number]

## 2011-03-14 NOTE — Op Note (Signed)
[facility]  Patient:    Levi Jones, Levi Jones                      MRN: [id number] Proc. Date: 02/01/01 Adm. Date:  [id number] Attending:  Shelly Rubenstein CC:         Oley Balm Sung Amabile, M.D. [facility] (with pathology report)   Operative Report  PREOPERATIVE DIAGNOSIS:  Lymphadenopathy, possible sarcoidosis.  POSTOPERATIVE DIAGNOSIS:  Lymphadenopathy, possible sarcoidosis.  PROCEDURES PERFORMED:  Excisional biopsy of right axillary lymph node.  SURGEON:  Abigail Miyamoto, M.D.  ANESTHESIA:  1% lidocaine and monitored anesthesia care.  ESTIMATED BLOOD LOSS:  Minimal.  INDICATIONS:  [name] is a 60 year old gentleman who has possible sarcoidosis. Because of lymphadenopathy, decision has been made to proceed with a lymph node biopsy to help determine the etiology of his lymphadenopathy.  The patient was found on examination to have enlarged lymph nodes in the cervical, left supraclavicular and bilateral axillary areas. He has chosen for me to remove lymph nodes in his axilla in order to achieve diagnosis.  DESCRIPTION OF PROCEDURE:  The patient was brought to the operating room and identified as [name]. He was placed supine on the operating room table and anesthesia was induced. His right axilla was then prepped and draped in the usual sterile fashion.  The skin of the axilla was then anesthetized with 1% lidocaine. A small incision was then created. The incision was carried down through the axillary fascia with the electrocautery. Several enlarged lymph nodes were then identified and dissected free with the electrocautery and completely removed from the field and sent to pathology for identification. Hemostasis appeared to be achieved in the wound. The wound was then irrigated with normal saline. It was further anesthetized with 1% lidocaine. It was then closed in layers with interrupted 3-0 Vicryl sutures and then a running 4-0  Monocryl suture. Steri-Strips, gauze and tape were then applied. The patient tolerated the procedure well. All sponge, needle, lap and instrument counts were correct at the end of the procedure. The patient was then taken in stable condition from the operating room to the recovery room. DD:  02/01/01 TD:  02/01/01 Job: [id number]

## 2011-03-14 NOTE — Op Note (Signed)
NAME:  Levi Jones, VOONG                         ACCOUNT NO.:  [id number]   MEDICAL RECORD NO.:  [id number]                   PATIENT TYPE:  AMB   LOCATION:  DAY                                  FACILITY:  [facility]   PHYSICIAN:  Marlowe Kays, M.D.               DATE OF BIRTH:  January 08, 1951   DATE OF PROCEDURE:  12/28/2003  DATE OF DISCHARGE:                                 OPERATIVE REPORT   PREOPERATIVE DIAGNOSES:  Torn medial meniscus, right knee.   POSTOPERATIVE DIAGNOSES:  1. Torn medial meniscus.  2. 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   OPERATION:  Right knee arthroscopy with 1) partial medial meniscectomy, 2)  debridement of the medial femoral condyle.   SURGEON:  Marlowe Kays, M.D.   ASSISTANT:  Nurse.   ANESTHESIA:  General.   PATHOLOGY AND JUSTIFICATION FOR PROCEDURE:  He has had pain in the inner  aspect of his right knee with an MRI demonstrating some mild degenerative  changes and a suspected inferior surface tear of the posterior medial  meniscus. See operative description below for additional details and  pathology.   DESCRIPTION OF PROCEDURE:  Satisfactory general anesthesia, pneumatic  tourniquet, thigh stabilizer, knee was esmarched out nonsterilely, prepped  with duraprep, draped in a sterile field. Superior medial saline inflow.  First through an anterior medial portal, the lateral compartment of the knee  joint was evaluated and had a little synovitis present but otherwise the  joint looked unremarkable.  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  I then reversed portals, ACL was intact.  This partial detachment of articular cartilage and fibular reactive fibrotic  synovium in the medial joint.  The synovium was resected with a 3.5 shaver  and I  debrided down the medial femoral condyle with a combination of small  baskets and a 3.5 shaver. The chondromalacia was not full thickness but was  extensive. In the posterior third of the medial meniscus, he basically had  an occult bucket handle tear and I removed what was essentially the bucket  handle fragment although I not slip down in the joint with a combination of  baskets and 3.5 shaver.  Final result was a small rim posteriorly beginning  just prior to the posterior curve which was stable.  I then looked up and  reaffirmed the patellar pathology.  The knee joint was then irrigated until  clear and all fluid possible removed. The two anterior portals were closed  with 4-0 nylon.  20 mL of 0.5% Marcaine with adrenaline and 4 mg of morphine  were then instilled through the inflap rasp  which was removed and this portal closed with 4-0 nylon as well. Betadine  Adaptic dry sterile  dressing were applied. The tourniquet was released. He  tolerated the procedure well and was taken to the recovery room in  satisfactory condition with no known complications.                                               Marlowe Kays, M.D.    JA/MEDQ  D:  12/28/2003  T:  12/28/2003  Job:  [id number]

## 2011-04-22 ENCOUNTER — Other Ambulatory Visit: Payer: Self-pay | Admitting: Internal Medicine

## 2011-04-24 ENCOUNTER — Encounter: Payer: Self-pay | Admitting: Internal Medicine

## 2011-04-24 NOTE — Telephone Encounter (Signed)
Not at work, home number doesn't ring like a phone call--so I sent a letter requesting that patient call and schedule an appt with Dr Drue Novel

## 2011-04-29 ENCOUNTER — Encounter: Payer: Self-pay | Admitting: Internal Medicine

## 2011-04-29 ENCOUNTER — Ambulatory Visit (INDEPENDENT_AMBULATORY_CARE_PROVIDER_SITE_OTHER): Payer: 59 | Admitting: Internal Medicine

## 2011-04-29 ENCOUNTER — Other Ambulatory Visit: Payer: Self-pay | Admitting: Internal Medicine

## 2011-04-29 VITALS — BP 136/84 | HR 83 | Wt 249.2 lb

## 2011-04-29 DIAGNOSIS — N529 Male erectile dysfunction, unspecified: Secondary | ICD-10-CM

## 2011-04-29 DIAGNOSIS — I10 Essential (primary) hypertension: Secondary | ICD-10-CM

## 2011-04-29 DIAGNOSIS — R209 Unspecified disturbances of skin sensation: Secondary | ICD-10-CM

## 2011-04-29 MED ORDER — LOSARTAN POTASSIUM 100 MG PO TABS: 100.0000 mg | ORAL_TABLET | Freq: Every day | ORAL | Status: DC

## 2011-04-29 MED ORDER — AMLODIPINE BESYLATE 5 MG PO TABS: 5.0000 mg | ORAL_TABLET | Freq: Every day | ORAL | Status: DC

## 2011-04-29 NOTE — Telephone Encounter (Signed)
Patient had followup appt on 7/3 and has CPX scheduled for 07/30/2011

## 2011-04-29 NOTE — Assessment & Plan Note (Signed)
cialis 5 mg samples provided, to take 2 tablets daily as needed

## 2011-04-29 NOTE — Assessment & Plan Note (Signed)
BP well-controlled, labs, refilled medications for 6 months. Physical, will return to the office in 3 months

## 2011-04-29 NOTE — Patient Instructions (Signed)
cialis 5 mg tabs: 2 tablets a day as needed

## 2011-04-29 NOTE — Progress Notes (Signed)
  Subjective:    Patient ID: MANPREET STREY, male    DOB: 07/07/1951, 60 y.o.   MRN: [id number]  HPI  Routine office visit Good medication compliance, ambulatory blood pressures are checked sporadically and they are usually within normal  Past Medical History  Diagnosis Date  . Hypertension   . Osteoarthritis   . Sarcoidosis     h/o, DX in the 90's to early 2000s (Dr.Simonds)    Past Surgical History  Procedure Date  . Knee arthroscopy     Right   . Knee arthroscopy     Left     Review of Systems Denies any cough or lower extremities edema. Mild fatigue. He was seen in November with upper extremity paresthesias, symptoms self resolved.     Objective:   Physical Exam  Constitutional: He is oriented to person, place, and time. He appears well-developed and well-nourished. No distress.  Cardiovascular: Normal rate, regular rhythm and normal heart sounds.   No murmur heard. Pulmonary/Chest: Effort normal and breath sounds normal. No respiratory distress. He has no wheezes. He has no rales.  Musculoskeletal: He exhibits no edema.  Neurological: He is alert and oriented to person, place, and time.          Assessment & Plan:

## 2011-04-29 NOTE — Assessment & Plan Note (Signed)
Symptoms self resolved

## 2011-04-30 LAB — CBC WITH DIFFERENTIAL/PLATELET
Basophils Absolute: 0.1 10*3/uL (ref 0.0–0.1)
Basophils Relative: 1 % (ref 0–1)
Eosinophils Absolute: 0.3 10*3/uL (ref 0.0–0.7)
Eosinophils Relative: 4 % (ref 0–5)
Lymphs Abs: 2.1 10*3/uL (ref 0.7–4.0)
MCH: 28.3 pg (ref 26.0–34.0)
MCHC: 32.6 g/dL (ref 30.0–36.0)
MCV: 86.6 fL (ref 78.0–100.0)
Platelets: 159 10*3/uL (ref 150–400)
RDW: 12.8 % (ref 11.5–15.5)

## 2011-04-30 LAB — BASIC METABOLIC PANEL
CO2: 25 mEq/L (ref 19–32)
Calcium: 8.8 mg/dL (ref 8.4–10.5)
Creat: 1.48 mg/dL — ABNORMAL HIGH (ref 0.50–1.35)

## 2011-05-30 ENCOUNTER — Other Ambulatory Visit: Payer: Self-pay | Admitting: Internal Medicine

## 2011-07-29 ENCOUNTER — Encounter: Payer: Self-pay | Admitting: Internal Medicine

## 2011-07-30 ENCOUNTER — Ambulatory Visit: Payer: 59 | Admitting: Internal Medicine

## 2011-07-30 ENCOUNTER — Encounter: Payer: Self-pay | Admitting: Internal Medicine

## 2011-07-30 ENCOUNTER — Ambulatory Visit (INDEPENDENT_AMBULATORY_CARE_PROVIDER_SITE_OTHER): Payer: 59 | Admitting: Internal Medicine

## 2011-07-30 DIAGNOSIS — Z0000 Encounter for general adult medical examination without abnormal findings: Secondary | ICD-10-CM

## 2011-07-30 DIAGNOSIS — I10 Essential (primary) hypertension: Secondary | ICD-10-CM

## 2011-07-30 LAB — PSA: PSA: 0.25 ng/mL (ref 0.10–4.00)

## 2011-07-30 LAB — COMPREHENSIVE METABOLIC PANEL
AST: 36 U/L (ref 0–37)
Albumin: 4 g/dL (ref 3.5–5.2)
Alkaline Phosphatase: 57 U/L (ref 39–117)
BUN: 17 mg/dL (ref 6–23)
Potassium: 4.6 mEq/L (ref 3.5–5.1)

## 2011-07-30 LAB — LIPID PANEL
HDL: 56.3 mg/dL (ref >39.00)
Total CHOL/HDL Ratio: 4
Triglycerides: 151 mg/dL — ABNORMAL HIGH (ref 0.0–149.0)

## 2011-07-30 MED ORDER — AMLODIPINE BESYLATE 10 MG PO TABS: 10.0000 mg | ORAL_TABLET | Freq: Every day | ORAL | Status: DC

## 2011-07-30 NOTE — Assessment & Plan Note (Signed)
BP slightly elevated today, patient reports that ambulatory blood pressures are always slightly high. Plan: Increase Norvasc from 5 to 10 mg, a new prescription was issued. Office visit in 3 months

## 2011-07-30 NOTE — Progress Notes (Signed)
  Subjective:    Patient ID: Levi Jones, male    DOB: 1950/12/15, 60 y.o.   MRN: [id number]  HPI Complete physical exam, feels well Good medication compliance with BP meds, ambulatory blood pressure usually "a little high" ,no apparent side effects.   Past Medical History  Diagnosis Date  . Hypertension   . Osteoarthritis   . Sarcoidosis     h/o, DX in the 90's to early 2000s (Dr.Simonds)   Past Surgical History  Procedure Date  . Knee arthroscopy     Right   . Knee arthroscopy     Left    Family History  Problem Relation Age of Onset  . Lung cancer Father     smoker  . Coronary artery disease Mother     stents at age 75  . Hypertension Mother   . Heart failure Mother   . Prostate cancer Neg Hx   . Colon cancer Neg Hx    Social History: Married 2 children tobacco--no ETOH--no Works w/ the [employer] - [place] exercise-- active , no regular exercise    Review of Systems No chest pain or shortness of breath No nausea, vomiting, diarrhea blood in the stools. No dysuria, gross hematuria or difficulty urinating No anxiety or depression     Objective:   Physical Exam  Constitutional: He is oriented to person, place, and time. He appears well-developed and well-nourished.  HENT:  Head: Normocephalic and atraumatic.  Neck: No thyromegaly present.  Cardiovascular: Normal rate, regular rhythm and normal heart sounds.   No murmur heard. Pulmonary/Chest: Effort normal and breath sounds normal. No respiratory distress. He has no wheezes. He has no rales.  Abdominal: Soft. Bowel sounds are normal. He exhibits no distension. There is no tenderness. There is no rebound and no guarding.  Genitourinary: Rectum normal and prostate normal.  Musculoskeletal: He exhibits no edema.  Neurological: He is alert and oriented to person, place, and time.  Psychiatric: He has a normal mood and affect. His behavior is normal. Judgment and thought content normal.          Assessment  & Plan:  Visit was performed when the computers were down, documentation entered after the visit

## 2011-07-30 NOTE — Assessment & Plan Note (Addendum)
Td 2011, will get a flu shot at work colonoscopy 01-2003, next 10 years  labs counseled: diet-exercise

## 2011-08-01 ENCOUNTER — Telehealth: Payer: Self-pay | Admitting: *Deleted

## 2011-08-01 NOTE — Telephone Encounter (Signed)
Message     Tell the patient we are increasing the amlodipine from 5 mg to 10 mg, I already sent a prescription. Needs to come back in 3 months   Patient informed.

## 2011-08-14 ENCOUNTER — Other Ambulatory Visit: Payer: Self-pay | Admitting: Internal Medicine

## 2011-08-14 NOTE — Telephone Encounter (Signed)
Done

## 2011-11-26 ENCOUNTER — Telehealth: Payer: Self-pay | Admitting: Internal Medicine

## 2011-11-26 NOTE — Telephone Encounter (Signed)
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 Patient stated that he was here on 07/2011 for a physical and we can use that information for the form. He needs it to be faxed before his license's  expiration date 12/08/11. Best number to reach him is at [phone].

## 2011-12-09 ENCOUNTER — Ambulatory Visit (INDEPENDENT_AMBULATORY_CARE_PROVIDER_SITE_OTHER): Payer: 59 | Admitting: Internal Medicine

## 2011-12-09 VITALS — BP 130/84 | HR 78 | Temp 98.2°F | Wt 245.0 lb

## 2011-12-09 DIAGNOSIS — I10 Essential (primary) hypertension: Secondary | ICD-10-CM

## 2011-12-09 DIAGNOSIS — Z0000 Encounter for general adult medical examination without abnormal findings: Secondary | ICD-10-CM

## 2011-12-09 LAB — POCT URINALYSIS DIPSTICK
Blood, UA: NEGATIVE
Protein, UA: NEGATIVE
Spec Grav, UA: 1.025
Urobilinogen, UA: 0.2
pH, UA: 6

## 2011-12-09 NOTE — Assessment & Plan Note (Signed)
Well-controlled, no change 

## 2011-12-09 NOTE — Progress Notes (Signed)
  Subjective:    Patient ID: Levi Jones, male    DOB: 11/03/1950, 61 y.o.   MRN: [id number]  HPI Routine office visit Needs a DOT form completed.  Past Medical History  Diagnosis Date  . Hypertension   . Osteoarthritis   . Sarcoidosis     h/o, DX in the 90's to early 2000s (Dr.Simonds)   Past Surgical History  Procedure Date  . Knee arthroscopy     Right   . Knee arthroscopy     Left      Review of Systems Since I last saw him, he feels well. Amlodipine was increased from 5 to 10 mg, good compliance. Ambulatory BPs checked very rarely. Blood pressure today within normal. He remains active, he is a Corporate treasurer. Reports no apparent side effects such as lower extremity edema.     Objective:   Physical Exam  Constitutional: He is oriented to person, place, and time. He appears well-developed and well-nourished.  HENT:  Head: Normocephalic and atraumatic.  Cardiovascular: Normal rate, regular rhythm and normal heart sounds.   No murmur heard. Pulmonary/Chest: Effort normal and breath sounds normal. No respiratory distress. He has no wheezes. He has no rales.  Musculoskeletal: He exhibits no edema.  Neurological: He is alert and oriented to person, place, and time.       Assessment & Plan:  DOT form, extensive paperwork completed. Vision and hearing normal.

## 2011-12-10 ENCOUNTER — Encounter: Payer: Self-pay | Admitting: Internal Medicine

## 2011-12-23 ENCOUNTER — Other Ambulatory Visit: Payer: Self-pay | Admitting: Internal Medicine

## 2011-12-23 NOTE — Telephone Encounter (Signed)
Refill done.  

## 2012-01-05 ENCOUNTER — Telehealth: Payer: Self-pay | Admitting: Internal Medicine

## 2012-01-05 DIAGNOSIS — M79673 Pain in unspecified foot: Secondary | ICD-10-CM

## 2012-01-05 NOTE — Telephone Encounter (Signed)
Patient would like Dr. Drue Novel to refer him to a surgeon to remove the pins in his foot from a previous surgery. Patient's wife states that the patient is complaining that the pins are hurting.

## 2012-01-05 NOTE — Telephone Encounter (Signed)
Arrange a ortho referral, needs to see the same doctor that did the syrgery Dx -- foot pain

## 2012-01-06 ENCOUNTER — Ambulatory Visit (INDEPENDENT_AMBULATORY_CARE_PROVIDER_SITE_OTHER): Payer: 59 | Admitting: Internal Medicine

## 2012-01-06 ENCOUNTER — Encounter: Payer: Self-pay | Admitting: Internal Medicine

## 2012-01-06 ENCOUNTER — Ambulatory Visit (HOSPITAL_BASED_OUTPATIENT_CLINIC_OR_DEPARTMENT_OTHER)
Admission: RE | Admit: 2012-01-06 | Discharge: 2012-01-06 | Disposition: A | Discharge status: Home / Self Care | Payer: 59 | Source: Ambulatory Visit | Attending: Internal Medicine | Admitting: Internal Medicine

## 2012-01-06 ENCOUNTER — Encounter: Payer: Self-pay | Admitting: *Deleted

## 2012-01-06 VITALS — BP 110/82 | HR 74 | Temp 98.5°F | Ht 72.0 in | Wt 243.0 lb

## 2012-01-06 DIAGNOSIS — M79673 Pain in unspecified foot: Secondary | ICD-10-CM

## 2012-01-06 DIAGNOSIS — M79609 Pain in unspecified limb: Secondary | ICD-10-CM

## 2012-01-06 DIAGNOSIS — B070 Plantar wart: Secondary | ICD-10-CM

## 2012-01-06 NOTE — Telephone Encounter (Signed)
Discuss with patient  

## 2012-01-06 NOTE — Progress Notes (Signed)
  Subjective:    Patient ID: Levi Jones, male    DOB: [birth year], 61 y.o.   MRN: [id number]  HPI Acute visit Mild pain at the base of L great toe , plantar aspect, sx started a week ago, went from mild to extreme pain, only present when stepping w/that foot. Had a buninectomy years ago, thinks pain may be related  a "screw" from the surgery   Past Medical History  Diagnosis Date  . Hypertension   . Osteoarthritis   . Sarcoidosis     h/o, DX in the 90's to early 2000s (Dr.Simonds)   Past Surgical History  Procedure Date  . Knee arthroscopy     Right   . Knee arthroscopy     Left       Review of Systems No redness or swelling at the L foot, no injury    Objective:   Physical Exam A, Ox3 R foot normal L foot normal, specifically no podagra type of changes. At the plantar aspect of the base of the great toe (metatrsal head) he has a palpable slt raised wart        Assessment & Plan:  Plantar wart: Suspect pain is mostly from a plantar wart, pt thinks it may be a screw. I applied cryotherapy  To the area , may need to be freezed more than once, pt aware At the same time, he already has an appointment w/ortho, encouraged to keep it . Addendum: Pt likes to wait and see if therapy work thus will do a XR and keep appointment if the pain is severe. Additionally request a letter stating his predicament as he needs to cancel (due to pain) a previously booked fly. Multiple questions answered regards this condition,today , I spent more than 25  min with the patient, >50% of the time counseling, and coordinating his care with a letter

## 2012-01-07 ENCOUNTER — Telehealth: Payer: Self-pay | Admitting: Internal Medicine

## 2012-01-07 NOTE — Telephone Encounter (Signed)
Please advise 

## 2012-01-07 NOTE — Telephone Encounter (Signed)
LMOVM for pt to return call 

## 2012-01-07 NOTE — Telephone Encounter (Signed)
Left msg with pt to let him know he can call to schedule an appt early next weeke.

## 2012-01-07 NOTE — Telephone Encounter (Signed)
Patient states he is suppose to go out of town today & would like his X-ray results  Please call when these are available Patient ph# [phone]

## 2012-01-07 NOTE — Telephone Encounter (Signed)
Patient states he is still concerned that his foot has a wart on it & would like to know if & when he can come back in to get it frozen again. He states it is not any better & he is still in severe pain. Since Paz mention to him yesterday that there is a possibility of it being a wart he wants [place] to move forward & treat it as such   Phone# til [phone] otherwise call at home Thanks

## 2012-01-07 NOTE — Telephone Encounter (Signed)
See results

## 2012-01-07 NOTE — Telephone Encounter (Signed)
No point in doing it again so soon, please schedule something for early next week

## 2012-01-09 DIAGNOSIS — Z0279 Encounter for issue of other medical certificate: Secondary | ICD-10-CM

## 2012-01-12 ENCOUNTER — Ambulatory Visit: Payer: 59 | Admitting: Internal Medicine

## 2012-01-13 ENCOUNTER — Ambulatory Visit (INDEPENDENT_AMBULATORY_CARE_PROVIDER_SITE_OTHER): Payer: 59 | Admitting: Internal Medicine

## 2012-01-13 ENCOUNTER — Encounter: Payer: Self-pay | Admitting: Internal Medicine

## 2012-01-13 VITALS — BP 144/86 | HR 66 | Temp 97.8°F | Wt 245.0 lb

## 2012-01-13 DIAGNOSIS — B070 Plantar wart: Secondary | ICD-10-CM

## 2012-01-13 NOTE — Progress Notes (Signed)
  Subjective:    Patient ID: Levi Jones, male    DOB: 09/11/1951, 61 y.o.   MRN: [id number]  HPI Her for cryotherapy, pain improved a little temporarily after last treatment   Review of Systems     Objective:   Physical Exam  Unchanged       Assessment & Plan:  Cryotherapy applied with a qtip to the L plantar wart. If no better in few days he will be calling for a derm referral

## 2012-01-15 ENCOUNTER — Telehealth: Payer: Self-pay | Admitting: Internal Medicine

## 2012-01-15 DIAGNOSIS — B070 Plantar wart: Secondary | ICD-10-CM

## 2012-01-15 NOTE — Telephone Encounter (Signed)
Refer to derm, Dx plantar  wart

## 2012-01-15 NOTE — Telephone Encounter (Signed)
Done

## 2012-01-15 NOTE — Telephone Encounter (Signed)
Please advise 

## 2012-01-15 NOTE — Telephone Encounter (Signed)
Patient called and stated his foot is no better and would like a referral to a specialist. Please call as soon as possible at [phone]

## 2012-01-15 NOTE — Telephone Encounter (Signed)
Addended by: Edwena Felty T on: 01/15/2012 01:46 PM   Modules accepted: Orders

## 2012-02-05 ENCOUNTER — Other Ambulatory Visit: Payer: Self-pay | Admitting: Internal Medicine

## 2012-02-06 NOTE — Telephone Encounter (Signed)
Refill done.  

## 2012-03-02 ENCOUNTER — Other Ambulatory Visit: Payer: Self-pay | Admitting: Internal Medicine

## 2012-03-02 NOTE — Telephone Encounter (Signed)
Refill done.  

## 2012-08-09 ENCOUNTER — Ambulatory Visit (INDEPENDENT_AMBULATORY_CARE_PROVIDER_SITE_OTHER): Payer: 59 | Admitting: Internal Medicine

## 2012-08-09 VITALS — BP 136/84 | HR 68 | Temp 98.0°F | Ht 71.0 in | Wt 242.0 lb

## 2012-08-09 DIAGNOSIS — M1990 Unspecified osteoarthritis, unspecified site: Secondary | ICD-10-CM

## 2012-08-09 DIAGNOSIS — N529 Male erectile dysfunction, unspecified: Secondary | ICD-10-CM

## 2012-08-09 DIAGNOSIS — I10 Essential (primary) hypertension: Secondary | ICD-10-CM

## 2012-08-09 DIAGNOSIS — Z0000 Encounter for general adult medical examination without abnormal findings: Secondary | ICD-10-CM

## 2012-08-09 LAB — COMPREHENSIVE METABOLIC PANEL
Albumin: 3.8 g/dL (ref 3.5–5.2)
BUN: 21 mg/dL (ref 6–23)
CO2: 27 mEq/L (ref 19–32)
Calcium: 9.3 mg/dL (ref 8.4–10.5)
Chloride: 107 mEq/L (ref 96–112)
Creatinine, Ser: 1.2 mg/dL (ref 0.4–1.5)
GFR: 78.5 mL/min (ref >60.00)
Potassium: 4.9 mEq/L (ref 3.5–5.1)

## 2012-08-09 LAB — CBC WITH DIFFERENTIAL/PLATELET
Basophils Absolute: 0 10*3/uL (ref 0.0–0.1)
Basophils Relative: 0.6 % (ref 0.0–3.0)
Eosinophils Absolute: 0.2 10*3/uL (ref 0.0–0.7)
Hemoglobin: 13.5 g/dL (ref 13.0–17.0)
Lymphocytes Relative: 31.5 % (ref 12.0–46.0)
MCHC: 31.7 g/dL (ref 30.0–36.0)
Monocytes Relative: 9.5 % (ref 3.0–12.0)
Neutro Abs: 3.5 10*3/uL (ref 1.4–7.7)
Neutrophils Relative %: 54.7 % (ref 43.0–77.0)
RBC: 4.84 Mil/uL (ref 4.22–5.81)
RDW: 12.8 % (ref 11.5–14.6)

## 2012-08-09 LAB — TSH: TSH: 0.99 u[IU]/mL (ref 0.35–5.50)

## 2012-08-09 LAB — LIPID PANEL
HDL: 51.6 mg/dL (ref >39.00)
Triglycerides: 99 mg/dL (ref 0.0–149.0)
VLDL: 19.8 mg/dL (ref 0.0–40.0)

## 2012-08-09 NOTE — Assessment & Plan Note (Signed)
Td  2011 Declined flu shot explained benefits zostavax discussed , explained benefits, will call if interested  Colonoscopy 2004, normal, next in 10 years, Dr.Santogade Labs (had a light breakfast) Continue w/ healthier lifestyle

## 2012-08-09 NOTE — Assessment & Plan Note (Signed)
Well-controlled, no change. Return to the office in one year

## 2012-08-09 NOTE — Assessment & Plan Note (Signed)
cialis cost is an issue

## 2012-08-09 NOTE — Progress Notes (Signed)
  Subjective:    Patient ID: Levi Jones, male    DOB: 06-19-1951, 61 y.o.   MRN: [id number]  HPI CPX  Past Medical History: Hypertension Osteoarthritis h/o Sarcoidosis dx in the 90s to early 2000s (Dr Sung Amabile)  Past Surgical History: Left foot pain remotely, at [facility], podiatry. knee scope rt x 2, left x 1  Family History: F - lung Ca +smoker CAD-- M stents at age 29 HTN-- M CHF --M DM - no prostate Ca - no colon ca--no  Social History: Married, 2 children tobacco--no ETOH--no Works w/ the [employer] - [employer]-- very healthy most of the time  exercise-- active , occasional walk Has lost ~ 8 pounds   Review of Systems No chest pain or shortness of breath No nausea, vomiting, diarrhea or blood in the stools. No dysuria, gross hematuria or difficulty urinating. Denies anxiety or depression. Occasionally has left foot pain. See assessment and plan.     Objective:   Physical Exam General -- alert, well-developed  Neck --no thyromegaly , normal carotid pulse Lungs -- normal respiratory effort, no intercostal retractions, no accessory muscle use, and normal breath sounds.   Heart-- normal rate, regular rhythm, no murmur, and no gallop.   Abdomen--soft, non-tender, no distention, no masses, no HSM, no guarding, and no rigidity.   Extremities-- no pretibial edema bilaterally Rectal-- No external abnormalities noted. Normal sphincter tone. No rectal masses or tenderness. no stool found Prostate:  Prostate gland firm and smooth, no enlargement, nodularity, tenderness, mass, asymmetry or induration. Neurologic-- alert & oriented X3 and strength normal in all extremities. Psych-- Cognition and judgment appear intact. Alert and cooperative with normal attention span and concentration.  not anxious appearing and not depressed appearing.       Assessment & Plan:

## 2012-08-09 NOTE — Assessment & Plan Note (Addendum)
Had surgery at left foot remotely, has on and off discomfort, recommend to see the orthopedic doctor if needed.

## 2012-08-09 NOTE — Patient Instructions (Addendum)
Continue your healthier lifestyle Think about a flu shot and a shingles shot Check the  blood pressure 2 or 3 times a week, be sure it is between 110/60 and 135/85. If it is consistently higher or lower, let me know Next visit in one year for another complete physical. --- If you continue with pain, please see orthopedic doctor that specializes in foot care such as Dr. Lajoyce Corners

## 2012-08-10 ENCOUNTER — Encounter: Payer: Self-pay | Admitting: Internal Medicine

## 2012-08-11 ENCOUNTER — Encounter: Payer: Self-pay | Admitting: *Deleted

## 2012-09-05 ENCOUNTER — Other Ambulatory Visit: Payer: Self-pay | Admitting: Internal Medicine

## 2012-09-06 NOTE — Telephone Encounter (Signed)
Refill done.  

## 2012-09-28 ENCOUNTER — Ambulatory Visit: Payer: 59 | Admitting: Internal Medicine

## 2012-12-15 ENCOUNTER — Encounter: Payer: Self-pay | Admitting: Internal Medicine

## 2012-12-15 ENCOUNTER — Ambulatory Visit (INDEPENDENT_AMBULATORY_CARE_PROVIDER_SITE_OTHER): Payer: 59 | Admitting: Internal Medicine

## 2012-12-15 VITALS — BP 118/76 | HR 65 | Temp 98.0°F | Wt 242.0 lb

## 2012-12-15 DIAGNOSIS — M1990 Unspecified osteoarthritis, unspecified site: Secondary | ICD-10-CM

## 2012-12-15 MED ORDER — NYSTATIN-TRIAMCINOLONE 100000-0.1 UNIT/GM-% EX OINT: TOPICAL_OINTMENT | Freq: Two times a day (BID) | CUTANEOUS | Status: DC

## 2012-12-15 MED ORDER — IBUPROFEN 800 MG PO TABS: 800.0000 mg | ORAL_TABLET | Freq: Every day | ORAL | Status: DC | PRN

## 2012-12-15 MED ORDER — MELOXICAM 7.5 MG PO TABS: 7.5000 mg | ORAL_TABLET | Freq: Every day | ORAL | Status: DC | PRN

## 2012-12-15 NOTE — Patient Instructions (Addendum)
Instead of Motrin take 1 or 2 tablets of meloxicam a day  as needed for pain. Always take it with food. Watch for stomach side effects (gastritis): nausea, stomach pain, change in the color of stools. Tylenol as needed is ok also

## 2012-12-15 NOTE — Assessment & Plan Note (Addendum)
Knee symptoms consistent with DJD, he probably has a right Baker's cyst. Needs to see ortho, likes to see [place] [facility]. Continue with ice. We had a long discussion about NSAIDs, I recommended meloxicam instead of motrin and gave him a prescription. Eventually the patient decided that he would prefer Motrin 800 mg qd prn. A prescription was issued. GI side effects discussed. knows not to take Motrin and meloxicam the same day.

## 2012-12-15 NOTE — Progress Notes (Signed)
  Subjective:    Patient ID: Levi Jones, male    DOB: 04-14-1951, 62 y.o.   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 The right is worse than the left, swelling worse at  the popliteal area. Also has a rash in the calf, + itch   Past Medical History  Diagnosis Date  . Hypertension   . Osteoarthritis   . Sarcoidosis     h/o, DX in the 90's to early 2000s (Dr.Simonds)    Past Surgical History  Procedure Laterality Date  . Knee arthroscopy      Right x2  . Knee arthroscopy      Left x1  . Foot surgery Left      remotely, at [facility], podiatry.     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 Mild swelling without obvious effusion. Right popliteal area slightly bulge, not tender or warm. Skin-- he has a 4x6 cm patch of hyperpigmentation, dry slightly scaly skin.  Psych-- Cognition and judgment appear intact. Alert and cooperative with normal attention span and concentration.  not anxious appearing and not depressed appearing.       Assessment & Plan:  Rash, right leg. Eczema versus dry skin versus fungal. Will treat with Mycolog.

## 2013-02-24 ENCOUNTER — Other Ambulatory Visit: Payer: Self-pay | Admitting: Internal Medicine

## 2013-02-24 NOTE — Telephone Encounter (Signed)
Refill done.  

## 2013-07-11 ENCOUNTER — Other Ambulatory Visit: Payer: Self-pay | Admitting: Internal Medicine

## 2013-07-11 NOTE — Telephone Encounter (Signed)
rx refilled per protocol. DJR  

## 2013-09-24 ENCOUNTER — Other Ambulatory Visit: Payer: Self-pay | Admitting: Internal Medicine

## 2013-09-26 NOTE — Telephone Encounter (Signed)
Losartan refilled per protocol. OV due 

## 2013-09-28 ENCOUNTER — Telehealth: Payer: Self-pay | Admitting: *Deleted

## 2013-09-28 NOTE — Telephone Encounter (Signed)
Received phone call from patients wife stating that patient has a headache, elevated BP, and just not feeling well. She stated his blood pressure at work was 146/108. Pt was advised to be seen in the ER b/c DR. Paz didn't have any openings today. She agreed. JG//CMA

## 2013-09-29 ENCOUNTER — Encounter: Payer: Self-pay | Admitting: Internal Medicine

## 2013-09-29 ENCOUNTER — Ambulatory Visit (INDEPENDENT_AMBULATORY_CARE_PROVIDER_SITE_OTHER): Payer: 59 | Admitting: Internal Medicine

## 2013-09-29 VITALS — BP 136/79 | HR 70 | Temp 98.5°F | Wt 249.4 lb

## 2013-09-29 DIAGNOSIS — I10 Essential (primary) hypertension: Secondary | ICD-10-CM

## 2013-09-29 NOTE — Progress Notes (Signed)
Pre visit review using our clinic review tool, if applicable. No additional management support is needed unless otherwise documented below in the visit note. 

## 2013-09-29 NOTE — Patient Instructions (Signed)
Minimal Blood Pressure Goal= AVERAGE < 140/90;  Ideal is an AVERAGE < 135/85. This AVERAGE should be calculated from @ least 5-7 BP readings taken @ different times of day on different days of week. You should not respond to isolated BP readings , but rather the AVERAGE for that week .Please bring your  blood pressure cuff to office visits to verify that it is reliable.It  can also be checked against the blood pressure device at the pharmacy. Finger or wrist cuffs are not dependable; an arm cuff is.   Avoid ingestion of  excess salt/sodium.Cook with pepper & other spices . Use the salt substitute "No Salt" OR the Mrs Dash products to season food @ the table. Avoid foods which taste salty or "vinegary" as their sodium content will be high. 

## 2013-09-29 NOTE — Progress Notes (Signed)
   Subjective:    Patient ID: Levi Jones, male    DOB: [birth year], 62 y.o.   MRN: [id number]  HPI   He experienced a left frontal "dull" headache for several hours yesterday. He was at work and had his blood pressure checked; it was 186/108. He took Tylenol with resolution of the headache.  He is on amlodipine and losartan for his blood pressure. He does not have a home blood pressure cuff area. He had been non compliant with anti hypertemsive medication; off 2-3 weeks.He has been on a natural diet supplement    Review of Systems   No lightheadedness or other adverse medication effect described when on the meds.     Significant  epistaxis, chest pain, palpitations, exertional dyspnea, claudication, paroxysmal nocturnal dyspnea, or persistent edema absent.     Objective:   Physical Exam  Appears healthy and well-nourished & in no acute distress   Fundi difficult to visualize but arteriolar narrowing suggested  No carotid bruits are present.No neck pain distention present at 10 - 15 degrees. Thyroid normal to palpation  Heart rhythm and rate are normal with no significant murmurs or gallops. S4 with sl slurring  Chest is clear with no increased work of breathing  There is no evidence of aortic aneurysm or renal artery bruits  Abdomen soft with no organomegaly or masses. No HJR  No clubbing, cyanosis or edema present.  Pedal pulses are intact   No ischemic skin changes are present .    Alert and oriented. Strength, tone, DTRs reflexes normal          Assessment & Plan:  #1 HTN See recommendations

## 2013-10-23 ENCOUNTER — Other Ambulatory Visit: Payer: Self-pay | Admitting: Internal Medicine

## 2013-10-24 NOTE — Telephone Encounter (Signed)
Losartan refilled per protocol. JG//CMA 

## 2013-11-28 ENCOUNTER — Other Ambulatory Visit: Payer: Self-pay | Admitting: Internal Medicine

## 2013-11-28 NOTE — Telephone Encounter (Signed)
Losartan refilled per protocol. JG//CMA 

## 2013-12-15 ENCOUNTER — Other Ambulatory Visit: Payer: Self-pay | Admitting: Internal Medicine

## 2013-12-15 DIAGNOSIS — I10 Essential (primary) hypertension: Secondary | ICD-10-CM

## 2013-12-16 NOTE — Telephone Encounter (Signed)
RF for #30 of norvasc sent to [facility], pt advised that appt is required for additional refills.

## 2014-02-03 ENCOUNTER — Other Ambulatory Visit: Payer: Self-pay | Admitting: Internal Medicine

## 2014-02-27 ENCOUNTER — Telehealth: Payer: Self-pay | Admitting: *Deleted

## 2014-02-27 NOTE — Telephone Encounter (Signed)
Spoke with patients wife who states that pt has been having pain in the right arm for 2 weeks and is beginning to have numbness in his fingers.

## 2014-02-28 ENCOUNTER — Ambulatory Visit (INDEPENDENT_AMBULATORY_CARE_PROVIDER_SITE_OTHER): Payer: 59 | Admitting: Internal Medicine

## 2014-02-28 ENCOUNTER — Encounter: Payer: Self-pay | Admitting: Internal Medicine

## 2014-02-28 VITALS — BP 126/75 | HR 72 | Temp 96.0°F | Wt 248.0 lb

## 2014-02-28 DIAGNOSIS — I10 Essential (primary) hypertension: Secondary | ICD-10-CM

## 2014-02-28 DIAGNOSIS — M5010 Cervical disc disorder with radiculopathy, unspecified cervical region: Secondary | ICD-10-CM

## 2014-02-28 DIAGNOSIS — M5412 Radiculopathy, cervical region: Secondary | ICD-10-CM

## 2014-02-28 HISTORY — DX: Cervical disc disorder with radiculopathy, unspecified cervical region: M50.10

## 2014-02-28 LAB — BASIC METABOLIC PANEL
BUN: 18 mg/dL (ref 6–23)
CALCIUM: 9.4 mg/dL (ref 8.4–10.5)
CO2: 28 mEq/L (ref 19–32)
Chloride: 107 mEq/L (ref 96–112)
Creatinine, Ser: 1.4 mg/dL (ref 0.4–1.5)
GFR: 67.11 mL/min (ref >60.00)
Glucose, Bld: 110 mg/dL — ABNORMAL HIGH (ref 70–99)
Potassium: 4.4 mEq/L (ref 3.5–5.1)
SODIUM: 141 meq/L (ref 135–145)

## 2014-02-28 MED ORDER — NYSTATIN-TRIAMCINOLONE 100000-0.1 UNIT/GM-% EX OINT: TOPICAL_OINTMENT | Freq: Two times a day (BID) | CUTANEOUS | Status: DC

## 2014-02-28 MED ORDER — LORATADINE 10 MG PO TABS: ORAL_TABLET | ORAL | Status: DC

## 2014-02-28 MED ORDER — CYCLOBENZAPRINE HCL 10 MG PO TABS: 10.0000 mg | ORAL_TABLET | Freq: Every evening | ORAL | Status: DC | PRN

## 2014-02-28 NOTE — Patient Instructions (Signed)
Get your blood work before you leave   Next visit is for a physical exam in 3-4 months, fasting  Motrin 800 mg  every 8 hours as needed for pain. Always take it with food because may cause gastritis and ulcers. If you notice nausea, stomach pain, change in the color of stools --->  Stop the medicine and let us know Or Tylenol  500 mg OTC 2 tabs a day every 8 hours as needed for pain  If you need motrin consistently let me know, will have to do something different

## 2014-02-28 NOTE — Progress Notes (Signed)
Subjective:    Patient ID: Levi Jones, male    DOB: [birth year], 63 y.o.   MRN: [id number]  DOS:  02/28/2014 Type of  visit: Acute visit, here with his wife 2 or 3 weeks history of ill-defined discomfort in the right shoulder with radiation to the right arm to the first and second finger. The discomfort at the shoulder area is steady, the radiation to the arm changes depending on posture. Has taken no meds for the pain     ROS No neck pain per se. No recent injury. No bladder or bowel incontinence, no gait disorder. Takes Motrin as needed with no apparent side effects.   Past Medical History  Diagnosis Date  . Hypertension   . Osteoarthritis   . Sarcoidosis     h/o, DX in the 90's to early 2000s (Dr.Simonds)    Past Surgical History  Procedure Laterality Date  . Knee arthroscopy      Right x2  . Knee arthroscopy      Left x1  . Foot surgery Left      remotely, at [facility], podiatry.    History   Social History  . Marital Status: Married    Spouse Name: N/A    Number of Children: 2  . Years of Education: N/A   Occupational History  . Sherrif    . [place] History Main Topics  . Smoking status: Never Smoker   . Smokeless tobacco: Never Used  . Alcohol Use: No  . Drug Use: No  . Sexual Activity: Not on file   Other Topics Concern  . Not on file   Social History Narrative            Medication List       This list is accurate as of: 02/28/14 10:02 PM.  Always use your most recent med list.               amLODipine 10 MG tablet  Commonly known as:  NORVASC  TAKE 1 TABLET (10 MG TOTAL) BY MOUTH DAILY. MUST MAKE APPT FOR ADDITIONAL REFILLS.     cyclobenzaprine 10 MG tablet  Commonly known as:  FLEXERIL  Take 1 tablet (10 mg total) by mouth at bedtime as needed for muscle spasms.     ibuprofen 800 MG tablet  Commonly known as:  ADVIL,MOTRIN  Take 1 tablet (800 mg total) by mouth daily as needed for pain.     loratadine 10 MG tablet  Commonly known as:  CLARITIN  TAKE 1 TABLET BY MOUTH EVERY DAY     losartan 100 MG tablet  Commonly known as:  COZAAR  TAKE 1 TABLET (100 MG TOTAL) BY MOUTH DAILY.     nystatin-triamcinolone ointment  Commonly known as:  MYCOLOG  Apply topically 2 (two) times daily.           Objective:   Physical Exam BP 126/75  Pulse 72  Temp(Src) 96 F (35.6 C)  Wt 248 lb (112.492 kg)  SpO2 96% General -- alert, well-developed, NAD.  Neck -not tender to palpation on the cervical spine, range of motion is normal.  Extremities-- no pretibial edema bilaterally ; shoulders symmetric, range of motion normal, slightly tender at the right a.c. Joint? Neurologic--  alert & oriented X3. Speech normal, gait normal, strength normal in all extremities.  DTRs symmetrically decreased. Psych-- Cognition and judgment appear intact. Cooperative with normal attention span and concentration. No anxious or depressed  appearing.        Assessment & Plan:  Needs a physical, see instructions.

## 2014-02-28 NOTE — Progress Notes (Signed)
Pre visit review using our clinic review tool, if applicable. No additional management support is needed unless otherwise documented below in the visit note. 

## 2014-02-28 NOTE — Assessment & Plan Note (Signed)
Radiculopathy, Suspect pain/discomfort is due to radiculopathyas the symptoms are located at a C7-C6 distribution. Declined to take prednisone We agreed on sending him to orthopedics., he will continue taking Motrin 800 mg as needed, GI precautions discussed. Also to take Tylenol and Flexeril at night as needed. If he is taking Motrin consistently he will let me know.

## 2014-02-28 NOTE — Assessment & Plan Note (Signed)
  Hypertension, Seems to be well-controlled, since he is here, will check a BMP.

## 2014-03-01 ENCOUNTER — Telehealth: Payer: Self-pay | Admitting: Internal Medicine

## 2014-03-01 NOTE — Telephone Encounter (Signed)
Relevant patient education mailed to patient.  

## 2014-03-14 ENCOUNTER — Other Ambulatory Visit: Payer: Self-pay | Admitting: Gastroenterology

## 2014-03-14 LAB — HM COLONOSCOPY: HM COLON: NORMAL

## 2014-03-16 ENCOUNTER — Other Ambulatory Visit: Payer: Self-pay | Admitting: Internal Medicine

## 2014-03-19 ENCOUNTER — Other Ambulatory Visit: Payer: Self-pay | Admitting: Internal Medicine

## 2014-03-27 ENCOUNTER — Encounter: Payer: Self-pay | Admitting: Internal Medicine

## 2014-04-20 ENCOUNTER — Other Ambulatory Visit: Payer: Self-pay | Admitting: Internal Medicine

## 2014-05-01 ENCOUNTER — Other Ambulatory Visit: Payer: Self-pay | Admitting: Internal Medicine

## 2014-05-01 DIAGNOSIS — J302 Other seasonal allergic rhinitis: Secondary | ICD-10-CM

## 2014-05-01 NOTE — Telephone Encounter (Signed)
[facility]

## 2014-06-10 ENCOUNTER — Other Ambulatory Visit: Payer: Self-pay | Admitting: Internal Medicine

## 2014-06-12 ENCOUNTER — Other Ambulatory Visit: Payer: Self-pay | Admitting: Internal Medicine

## 2014-08-10 ENCOUNTER — Encounter: Payer: Self-pay | Admitting: Internal Medicine

## 2014-08-10 ENCOUNTER — Ambulatory Visit (INDEPENDENT_AMBULATORY_CARE_PROVIDER_SITE_OTHER): Payer: 59 | Admitting: Internal Medicine

## 2014-08-10 VITALS — BP 133/86 | HR 61 | Temp 98.0°F | Ht 72.0 in | Wt 248.2 lb

## 2014-08-10 DIAGNOSIS — Z0000 Encounter for general adult medical examination without abnormal findings: Secondary | ICD-10-CM

## 2014-08-10 DIAGNOSIS — E010 Iodine-deficiency related diffuse (endemic) goiter: Secondary | ICD-10-CM

## 2014-08-10 LAB — LIPID PANEL
CHOLESTEROL: 189 mg/dL (ref 0–200)
HDL: 43.1 mg/dL (ref >39.00)
LDL Cholesterol: 122 mg/dL — ABNORMAL HIGH (ref 0–99)
NonHDL: 145.9
TRIGLYCERIDES: 120 mg/dL (ref 0.0–149.0)
Total CHOL/HDL Ratio: 4
VLDL: 24 mg/dL (ref 0.0–40.0)

## 2014-08-10 LAB — BASIC METABOLIC PANEL
BUN: 22 mg/dL (ref 6–23)
CO2: 24 mEq/L (ref 19–32)
Calcium: 9.3 mg/dL (ref 8.4–10.5)
Chloride: 106 mEq/L (ref 96–112)
Creatinine, Ser: 1.4 mg/dL (ref 0.4–1.5)
GFR: 67.57 mL/min (ref >60.00)
Glucose, Bld: 96 mg/dL (ref 70–99)
POTASSIUM: 4.1 meq/L (ref 3.5–5.1)
SODIUM: 138 meq/L (ref 135–145)

## 2014-08-10 LAB — CBC WITH DIFFERENTIAL/PLATELET
BASOS PCT: 0.6 % (ref 0.0–3.0)
Basophils Absolute: 0 10*3/uL (ref 0.0–0.1)
EOS ABS: 0.4 10*3/uL (ref 0.0–0.7)
EOS PCT: 5.4 % — AB (ref 0.0–5.0)
HCT: 42 % (ref 39.0–52.0)
HEMOGLOBIN: 13.4 g/dL (ref 13.0–17.0)
LYMPHS PCT: 27.4 % (ref 12.0–46.0)
Lymphs Abs: 1.8 10*3/uL (ref 0.7–4.0)
MCHC: 31.8 g/dL (ref 30.0–36.0)
MCV: 85.8 fl (ref 78.0–100.0)
MONO ABS: 0.6 10*3/uL (ref 0.1–1.0)
Monocytes Relative: 9.6 % (ref 3.0–12.0)
NEUTROS ABS: 3.8 10*3/uL (ref 1.4–7.7)
Neutrophils Relative %: 57 % (ref 43.0–77.0)
Platelets: 149 10*3/uL — ABNORMAL LOW (ref 150.0–400.0)
RBC: 4.9 Mil/uL (ref 4.22–5.81)
RDW: 12.7 % (ref 11.5–15.5)
WBC: 6.7 10*3/uL (ref 4.0–10.5)

## 2014-08-10 LAB — TSH: TSH: 1.56 u[IU]/mL (ref 0.35–4.50)

## 2014-08-10 LAB — ALT: ALT: 29 U/L (ref 0–53)

## 2014-08-10 LAB — PSA: PSA: 0.27 ng/mL (ref 0.10–4.00)

## 2014-08-10 LAB — AST: AST: 26 U/L (ref 0–37)

## 2014-08-10 NOTE — Assessment & Plan Note (Addendum)
Td  2011 H/o sarcoid-- rec pnm shot and prevnar, states  will think about it  Declined flu shot  zostavax discussed before  Colonoscopy 2004, normal  Dr.Santogade Scope 02-2014, + Polyp, high-grade dysplasia, next in one year, Dr. Michail Sermon Labs   ekg-- nsr, no acute, at baseline Discussed healthy lifestyle Enlarged thyroid? Check ultrasound As long as BP is okay, follow up in one year. See instructions

## 2014-08-10 NOTE — Patient Instructions (Signed)
Get your blood work before you leave   Will schedule a ultrasound of the neck  Check the  blood pressure  Monthly  Be sure your blood pressure is between  145/85 and 110/65.  if it is consistently higher or lower, let me know    Please come back to the office in 1 year for a physical exam. Come back fasting

## 2014-08-10 NOTE — Progress Notes (Signed)
Subjective:    Patient ID: Levi Jones, male    DOB: [birth year], 63 y.o.   MRN: [id number]  DOS:  08/10/2014 Type of visit - description : cpx Interval history:feels well, had neck pain, see previous ov, sx gone   ROS Diet-- regular Exercise-- no routine exercise  No  CP, SOB No palpitations  Denies  nausea, vomiting diarrhea, blood in the stools occ cough in am, dry; no sputum production (-) wheezing, chest congestion   No dysuria, gross hematuria, no difficulty urinating  No anxiety, depression     Past Medical History  Diagnosis Date  . Hypertension   . Osteoarthritis   . Sarcoidosis     h/o, DX in the 90's to early 2000s (Dr.Simonds)    Past Surgical History  Procedure Laterality Date  . Knee arthroscopy      Right x2  . Knee arthroscopy      Left x1  . Foot surgery Left      remotely, at [facility], podiatry.    History   Social History  . Marital Status: Married    Spouse Name: N/A    Number of Children: 2  . Years of Education: N/A   Occupational History  . Sherrif    . [place] History Main Topics  . Smoking status: Never Smoker   . Smokeless tobacco: Never Used  . Alcohol Use: No  . Drug Use: No  . Sexual Activity: Not on file   Other Topics Concern  . Not on file   Social History Narrative    Household-- pt and wife     Family History  Problem Relation Age of Onset  . Lung cancer Father     smoker  . Coronary artery disease Mother     stents at age 36  . Hypertension Mother   . Heart failure Mother   . Prostate cancer Neg Hx   . Colon cancer Neg Hx   . Stroke Neg Hx       Medication List       This list is accurate as of: 08/10/14  2:25 PM.  Always use your most recent med list.               amLODipine 10 MG tablet  Commonly known as:  NORVASC  Take 1 tablet by mouth daily.     loratadine 10 MG tablet  Commonly known as:  CLARITIN  TAKE 1 TABLET BY MOUTH EVERY DAY     losartan 100 MG tablet  Commonly known as:  COZAAR  TAKE 1 TABLET (100 MG TOTAL) BY MOUTH DAILY.     nystatin-triamcinolone ointment  Commonly known as:  MYCOLOG  Apply topically 2 (two) times daily.           Objective:   Physical Exam BP 133/86  Pulse 61  Temp(Src) 98 F (36.7 C) (Oral)  Ht 6' (1.829 m)  Wt 248 lb 4 oz (112.605 kg)  BMI 33.66 kg/m2  SpO2 97% General -- alert, well-developed, NAD.  Neck --symmetric  Thyromegaly? No pain or nodules; normal carotid pulse, no LAD HEENT-- Not pale.  Lungs -- normal respiratory effort, no intercostal retractions, no accessory muscle use, and normal breath sounds.  Heart-- normal rate, regular rhythm, no murmur.  Abdomen-- Not distended, good bowel sounds,soft, non-tender. No rebound or rigidity.  Rectal-- No external abnormalities noted. Normal sphincter tone. No rectal masses or tenderness. Stool brown   Prostate--Prostate gland  firm and smooth, no enlargement, nodularity, tenderness, mass, asymmetry or induration. Extremities-- no pretibial edema bilaterally  Neurologic--  alert & oriented X3. Speech normal, gait appropriate for age, strength symmetric and appropriate for age.  Psych-- Cognition and judgment appear intact. Cooperative with normal attention span and concentration. No anxious or depressed appearing.      Assessment & Plan:

## 2014-08-10 NOTE — Progress Notes (Signed)
Pre visit review using our clinic review tool, if applicable. No additional management support is needed unless otherwise documented below in the visit note. 

## 2014-08-11 ENCOUNTER — Ambulatory Visit (HOSPITAL_BASED_OUTPATIENT_CLINIC_OR_DEPARTMENT_OTHER): Payer: 59

## 2014-09-27 ENCOUNTER — Ambulatory Visit (INDEPENDENT_AMBULATORY_CARE_PROVIDER_SITE_OTHER): Payer: 59 | Admitting: Medical

## 2014-09-27 ENCOUNTER — Encounter: Payer: Self-pay | Admitting: Medical

## 2014-09-27 VITALS — BP 127/75 | HR 71 | Temp 98.7°F | Ht 71.0 in | Wt 245.0 lb

## 2014-09-27 DIAGNOSIS — M5430 Sciatica, unspecified side: Secondary | ICD-10-CM

## 2014-09-27 DIAGNOSIS — M5432 Sciatica, left side: Secondary | ICD-10-CM

## 2014-09-27 DIAGNOSIS — S39011A Strain of muscle, fascia and tendon of abdomen, initial encounter: Secondary | ICD-10-CM

## 2014-09-27 DIAGNOSIS — S76219A Strain of adductor muscle, fascia and tendon of unspecified thigh, initial encounter: Secondary | ICD-10-CM | POA: Insufficient documentation

## 2014-09-27 HISTORY — DX: Sciatica, unspecified side: M54.30

## 2014-09-27 MED ORDER — DICLOFENAC SODIUM 75 MG PO TBEC: 75.0000 mg | DELAYED_RELEASE_TABLET | Freq: Two times a day (BID) | ORAL | Status: DC

## 2014-09-27 MED ORDER — CYCLOBENZAPRINE HCL 5 MG PO TABS: 5.0000 mg | ORAL_TABLET | Freq: Every day | ORAL | Status: DC

## 2014-09-27 NOTE — Patient Instructions (Addendum)
You appear to have sciatica recently and groin strain.  I am prescribing diclofenac nsaid and muscle relaxant.   Please rest and no officiating for at least 10 days.  Follow up in 10 day or as needed.  If your signs or symptoms change let us know.  Sciatica Sciatica is pain, weakness, numbness, or tingling along the path of the sciatic nerve. The nerve starts in the lower back and runs down the back of each leg. The nerve controls the muscles in the lower leg and in the back of the knee, while also providing sensation to the back of the thigh, lower leg, and the sole of your foot. Sciatica is a symptom of another medical condition. For instance, nerve damage or certain conditions, such as a herniated disk or bone spur on the spine, pinch or put pressure on the sciatic nerve. This causes the pain, weakness, or other sensations normally associated with sciatica. Generally, sciatica only affects one side of the body. CAUSES   Herniated or slipped disc.  Degenerative disk disease.  A pain disorder involving the narrow muscle in the buttocks (piriformis syndrome).  Pelvic injury or fracture.  Pregnancy.  Tumor (rare). SYMPTOMS  Symptoms can vary from mild to very severe. The symptoms usually travel from the low back to the buttocks and down the back of the leg. Symptoms can include:  Mild tingling or dull aches in the lower back, leg, or hip.  Numbness in the back of the calf or sole of the foot.  Burning sensations in the lower back, leg, or hip.  Sharp pains in the lower back, leg, or hip.  Leg weakness.  Severe back pain inhibiting movement. These symptoms may get worse with coughing, sneezing, laughing, or prolonged sitting or standing. Also, being overweight may worsen symptoms. DIAGNOSIS  Your caregiver will perform a physical exam to look for common symptoms of sciatica. He or she may ask you to do certain movements or activities that would trigger sciatic nerve pain. Other  tests may be performed to find the cause of the sciatica. These may include:  Blood tests.  X-rays.  Imaging tests, such as an MRI or CT scan. TREATMENT  Treatment is directed at the cause of the sciatic pain. Sometimes, treatment is not necessary and the pain and discomfort goes away on its own. If treatment is needed, your caregiver may suggest:  Over-the-counter medicines to relieve pain.  Prescription medicines, such as anti-inflammatory medicine, muscle relaxants, or narcotics.  Applying heat or ice to the painful area.  Steroid injections to lessen pain, irritation, and inflammation around the nerve.  Reducing activity during periods of pain.  Exercising and stretching to strengthen your abdomen and improve flexibility of your spine. Your caregiver may suggest losing weight if the extra weight makes the back pain worse.  Physical therapy.  Surgery to eliminate what is pressing or pinching the nerve, such as a bone spur or part of a herniated disk. HOME CARE INSTRUCTIONS   Only take over-the-counter or prescription medicines for pain or discomfort as directed by your caregiver.  Apply ice to the affected area for 20 minutes, 3-4 times a day for the first 48-72 hours. Then try heat in the same way.  Exercise, stretch, or perform your usual activities if these do not aggravate your pain.  Attend physical therapy sessions as directed by your caregiver.  Keep all follow-up appointments as directed by your caregiver.  Do not wear high heels or shoes that do not  provide proper support.  Check your mattress to see if it is too soft. A firm mattress may lessen your pain and discomfort. SEEK IMMEDIATE MEDICAL CARE IF:   You lose control of your bowel or bladder (incontinence).  You have increasing weakness in the lower back, pelvis, buttocks, or legs.  You have redness or swelling of your back.  You have a burning sensation when you urinate.  You have pain that gets  worse when you lie down or awakens you at night.  Your pain is worse than you have experienced in the past.  Your pain is lasting longer than 4 weeks.  You are suddenly losing weight without reason. MAKE SURE YOU:  Understand these instructions.  Will watch your condition.  Will get help right away if you are not doing well or get worse. Document Released: 10/07/2001 Document Revised: 04/13/2012 Document Reviewed: 02/22/2012 [facility] Patient Information 2015 [place], [place]. This information is not intended to replace advice given to you by your health care provider. Make sure you discuss any questions you have with your health care provider.  Groin Strain A groin strain (also called a groin pull) is an injury to the muscles or tendon on the upper inner part of the thigh. These muscles are called the adductor muscles or groin muscles. They are responsible for moving the leg across the body. A muscle strain occurs when a muscle is overstretched and some muscle fibers are torn. A groin strain can range from mild to severe depending on how many muscle fibers are affected and whether the muscle fibers are partially or completely torn.  Groin strains usually occur during exercise or participation in sports. The injury often happens when a sudden, violent force is placed on a muscle, stretching the muscle too far. A strain is more likely to occur when your muscles are not warmed up or if you are not properly conditioned. Depending on the severity of the groin strain, recovery time may vary from a few weeks to several weeks. Severe injuries often require 4-6 weeks for recovery. In these cases, complete healing can take 4-5 months.  CAUSES   Stretching the groin muscles too far or too suddenly, often during side-to-side motion with an abrupt change in direction.  Putting repeated stress on the groin muscles over a long period of time.  Performing vigorous activity without properly stretching the  groin muscles beforehand. SYMPTOMS   Pain and tenderness in the groin area. This begins as sharp pain and persists as a dull ache.  Popping or snapping feeling when the injury occurs (for severe strains).  Swelling or bruising.  Muscle spasms.  Weakness in the leg.  Stiffness in the groin area with decreased ability to move the affected muscles. DIAGNOSIS  Your caregiver will perform a physical exam to diagnose a groin strain. You will be asked about your symptoms and how the injury occurred. X-rays are sometimes needed to rule out a broken bone or cartilage problems. Your caregiver may order a CT scan or MRI if a complete muscle tear is suspected. TREATMENT  A groin strain will often heal on its own. Your caregiver may prescribe medicines to help manage pain and swelling (anti-inflammatory medicine). You may be told to use crutches for the first few days to minimize your pain. HOME CARE INSTRUCTIONS   Rest. Do not use the strained muscle if it causes pain.  Put ice on the injured area.  Put ice in a plastic bag.  Place a towel  between your skin and the bag.  Leave the ice on for 15-20 minutes, every 2-3 hours. Do this for the first 2 days after the injury.  Only take over-the-counter or prescription medicines as directed by your caregiver.  Wrap the injured area with an elastic bandage as directed by your caregiver.  Keep the injured leg raised (elevated).  Walk, stretch, and perform range-of-motion exercises to improve blood flow to the injured area. Only perform these activities if you can do so without any pain. To prevent muscle strains:  Warm up before exercise.  Develop proper conditioning and strength in the groin muscles. SEEK IMMEDIATE MEDICAL CARE IF:   You have increased pain or swelling in the affected area.   Your symptoms are not improving or are getting worse. MAKE SURE YOU:   Understand these instructions.  Will watch your condition.  Will get  help right away if you are not doing well or get worse. Document Released: 06/10/2004 Document Revised: 09/29/2012 Document Reviewed: 06/16/2012 [facility] Patient Information 2015 [place], [place]. This information is not intended to replace advice given to you by your health care provider. Make sure you discuss any questions you have with your health care provider.

## 2014-09-27 NOTE — Progress Notes (Signed)
Pre visit review using our clinic review tool, if applicable. No additional management support is needed unless otherwise documented below in the visit note. 

## 2014-09-27 NOTE — Assessment & Plan Note (Signed)
diclofenac nsaid and muscle relaxant. Note if pain were to persist would consider referral to surgeon to evaluate for possible very high inguinal hernia that is early and just developing but none on exam today. Also if symptoms change or worsen would expand work up.

## 2014-09-27 NOTE — Assessment & Plan Note (Signed)
I prescribed diclofenac nsaid and muscle relaxant.   Advised no  officiating for at least 10 days.  If pain persist then consider lumbar xray and physical therapy.

## 2014-09-27 NOTE — Progress Notes (Signed)
Subjective:    Patient ID: Levi Jones, male    DOB: [birth year], 63 y.o.   MRN: [id number]  HPI    Pt in with some mild lt si area pain and some left lower quadrant region pain. Pt states around 24th of November felt the pain officiating. 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  No diarrhea. No mid lumbar pain. No reported abnormal urinary symptoms since onset of these pain areas. 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  Pt states pain in his groin is mostly when he gets up from sitting position.  Past Medical History  Diagnosis Date  . Hypertension   . Osteoarthritis   . Sarcoidosis     h/o, DX in the 90's to early 2000s (Dr.Simonds)    History   Social History  . Marital Status: Married    Spouse Name: N/A    Number of Children: 2  . Years of Education: N/A   Occupational History  . Sherrif    . [place] History Main Topics  . Smoking status: Never Smoker   . Smokeless tobacco: Never Used  . Alcohol Use: No  . Drug Use: No  . Sexual Activity: Not on file   Other Topics Concern  . Not on file   Social History Narrative    Household-- pt and wife    Past Surgical History  Procedure Laterality Date  . Knee arthroscopy      Right x2  . Knee arthroscopy      Left x1  . Foot surgery Left      remotely, at [facility], podiatry.    Family History  Problem Relation Age of Onset  . Lung cancer Father     smoker  . Coronary artery disease Mother     stents at age 21  . Hypertension Mother   . Heart failure Mother   . Prostate cancer Neg Hx   . Colon cancer Neg Hx   . Stroke Neg Hx     No Known Allergies  Current Outpatient Prescriptions on File Prior to Visit  Medication Sig Dispense Refill  . amLODipine (NORVASC) 10 MG tablet Take 1 tablet by mouth daily. 30 tablet 11  . loratadine (CLARITIN) 10  MG tablet TAKE 1 TABLET BY MOUTH EVERY DAY 30 tablet 5  . losartan (COZAAR) 100 MG tablet TAKE 1 TABLET (100 MG TOTAL) BY MOUTH DAILY. 30 tablet 5  . nystatin-triamcinolone ointment (MYCOLOG) Apply topically 2 (two) times daily. 30 g 1   No current facility-administered medications on file prior to visit.    BP 127/75 mmHg  Pulse 71  Temp(Src) 98.7 F (37.1 C) (Oral)  Ht 5\' 11"  (1.803 m)  Wt 245 lb (111.131 kg)  BMI 34.19 kg/m2  SpO2 96%    Review of Systems  Constitutional: Negative for fever, chills and fatigue.  Respiratory: Negative for cough, chest tightness, shortness of breath and wheezing.   Cardiovascular: Negative for chest pain and palpitations.  Gastrointestinal: Negative for nausea, abdominal pain, diarrhea and rectal pain.       Lt groin region pain.  Musculoskeletal: Negative for back pain.       Lt si area pain.  Neurological: Negative for dizziness, tremors, seizures, syncope, facial asymmetry, weakness, light-headedness, numbness and headaches.  Hematological: Negative for adenopathy. Does not bruise/bleed easily.  Psychiatric/Behavioral:  Negative for suicidal ideas, behavioral problems, self-injury and dysphoric mood. The patient is not nervous/anxious.        Objective:   Physical Exam    General Appearance- Not in acute distress.    Chest and Lung Exam Auscultation: Breath sounds:-Normal. Clear even and unlabored. Adventitious sounds:- No Adventitious sounds.  Cardiovascular Auscultation:Rythm - Regular, rate and rythm. Heart Sounds -Normal heart sounds.  Abdomen Inspection:-Inspection Normal.  Palpation/Perucssion: Palpation and Percussion of the abdomen reveal- Non Tender, No Rebound tenderness, No rigidity(Guarding) and No Palpable abdominal masses.  Liver:-Normal.  Spleen:- Normal.  Changing positions supine to sitting points to left groin area.  Back No Mid lumbar spine tenderness to palpation. Pain on straight leg lift. Pain direct  over lt SI area on palpation and when he changes position supine to sitting.  Lower ext neurologic  L5-S1 sensation intact bilaterally. Normal patellar reflexes bilaterally. No foot drop bilaterally. Lt si pain directly.  Gentiial exam- normal testicles were not tender.No hernias in inguinal canal.  He points to groin region as source of pain.         Assessment & Plan:

## 2014-11-09 ENCOUNTER — Other Ambulatory Visit: Payer: Self-pay | Admitting: Medical

## 2014-12-08 ENCOUNTER — Other Ambulatory Visit: Payer: Self-pay | Admitting: Medical

## 2015-02-14 ENCOUNTER — Other Ambulatory Visit: Payer: Self-pay | Admitting: Internal Medicine

## 2015-02-14 NOTE — Telephone Encounter (Signed)
Okay ibuprofen 800 mg one by mouth daily as needed for pain #90 no refills. Please be sure patient knows GI precautions: Take the medication only with food in the stomach, report any GI symptoms.

## 2015-02-14 NOTE — Telephone Encounter (Signed)
Pt requesting refill on Ibuprofen 800 mg, not currently on med list. Please advise.

## 2015-03-06 ENCOUNTER — Other Ambulatory Visit: Payer: Self-pay

## 2015-03-06 ENCOUNTER — Other Ambulatory Visit: Payer: Self-pay | Admitting: Internal Medicine

## 2015-03-28 ENCOUNTER — Encounter: Payer: Self-pay | Admitting: Internal Medicine

## 2015-03-28 ENCOUNTER — Ambulatory Visit (INDEPENDENT_AMBULATORY_CARE_PROVIDER_SITE_OTHER): Payer: 59 | Admitting: Internal Medicine

## 2015-03-28 VITALS — BP 116/74 | HR 71 | Temp 98.1°F | Ht 71.0 in | Wt 253.4 lb

## 2015-03-28 DIAGNOSIS — E049 Nontoxic goiter, unspecified: Secondary | ICD-10-CM | POA: Diagnosis not present

## 2015-03-28 DIAGNOSIS — M5010 Cervical disc disorder with radiculopathy, unspecified cervical region: Secondary | ICD-10-CM | POA: Diagnosis not present

## 2015-03-28 DIAGNOSIS — E010 Iodine-deficiency related diffuse (endemic) goiter: Secondary | ICD-10-CM

## 2015-03-28 MED ORDER — AMLODIPINE BESYLATE 10 MG PO TABS: 10.0000 mg | ORAL_TABLET | Freq: Every day | ORAL | Status: DC

## 2015-03-28 NOTE — Progress Notes (Signed)
Subjective:    Patient ID: Levi Jones, male    DOB: January 01, 1951, 64 y.o.   MRN: [id number]  DOS:  03/28/2015 Type of visit - description : acute, here w/ wife Interval history: Approximately 6 weeks ago he went to the orthopedic doctor with pain around the right shoulder and numbness at the right arm. He got neck x-ray and prescribed prednisone which help with his symptoms. He is here because he initially felt better with prednisone but now the pain is increasing, extending to the trapezoid area, the numbness was sharp and now is dull; symptoms increase with neck motion. He already contacted the orthopedic office, they advise him to be seen and they are planning a MRI in 10 days, he likes to get a sooner MRI   Review of Systems Denies any injury or fall No motor deficits No lower extremities paresthesias or any gait disorder.   Past Medical History  Diagnosis Date  . Hypertension   . Osteoarthritis   . Sarcoidosis     h/o, DX in the 90's to early 2000s (Dr.Simonds)    Past Surgical History  Procedure Laterality Date  . Knee arthroscopy      Right x2  . Knee arthroscopy      Left x1  . Foot surgery Left      remotely, at [facility], podiatry.    History   Social History  . Marital Status: Married    Spouse Name: N/A  . Number of Children: 2  . Years of Education: N/A   Occupational History  . Sherrif    . [place] History Main Topics  . Smoking status: Never Smoker   . Smokeless tobacco: Never Used  . Alcohol Use: No  . Drug Use: No  . Sexual Activity: Not on file   Other Topics Concern  . Not on file   Social History Narrative    Household-- pt and wife        Medication List       This list is accurate as of: 03/28/15  6:34 PM.  Always use your most recent med list.               amLODipine 10 MG tablet  Commonly known as:  NORVASC  Take 1 tablet (10 mg total) by mouth daily.     cyclobenzaprine 5 MG  tablet  Commonly known as:  FLEXERIL  Take 1 tablet (5 mg total) by mouth at bedtime.     ibuprofen 800 MG tablet  Commonly known as:  ADVIL,MOTRIN  Take 1 tablet (800 mg total) by mouth daily as needed for pain. Take with food.     loratadine 10 MG tablet  Commonly known as:  CLARITIN  TAKE 1 TABLET BY MOUTH EVERY DAY     losartan 100 MG tablet  Commonly known as:  COZAAR  Take 1 tablet (100 mg total) by mouth daily.           Objective:   Physical Exam  Musculoskeletal:       Arms:  BP 116/74 mmHg  Pulse 71  Temp(Src) 98.1 F (36.7 C) (Oral)  Ht 5\' 11"  (1.803 m)  Wt 253 lb 6 oz (114.93 kg)  BMI 35.35 kg/m2  SpO2 98%  General:   Well developed, well nourished . NAD.  HEENT:  Normocephalic . Face symmetric, atraumatic Mild thyromegaly noted again Msk:  Neck is full range of motion, no TTP at the  cervical spine however it is a slightly tender at the paraspinal area. See graphic Neurologic:  alert & oriented X3.  Speech normal, gait appropriate for age and unassisted DTRs symmetric, motor symmetric Shoulder range of motion normal bilaterally. Psych--  Cognition and judgment appear intact.  Cooperative with normal attention span and concentration.  Behavior appropriate. No anxious or depressed appearing.       Assessment & Plan:

## 2015-03-28 NOTE — Assessment & Plan Note (Addendum)
Patient continued to complain about pain at the right trapezoid and shoulder area and right upper extremity paresthesias, he has already been seen by orthopedic surgery, he was hoping I can get a sooner MRI for him. Advised patient,  continue working with orthopedic surgery, MRI within the next 10 days is completely appropriate, I favor  Tylenol for pain, okay motrin from time to time.

## 2015-03-28 NOTE — Patient Instructions (Signed)
For pain  Tylenol  500 mg OTC 2 tabs a day every 8 hours as needed for pain  Also ok IBUPROFEN (Advil or Motrin) 200 mg 2 tablets every 6 hours as needed for pain.  Always take it with food because may cause gastritis and ulcers.  If you notice nausea, stomach pain, change in the color of stools --->  Stop the medicine and let us know

## 2015-03-28 NOTE — Assessment & Plan Note (Signed)
Mild thyromegaly noted on exam few months ago, thyroid ultrasound was nor pursued by the patient. Plan: Schedule a thyroid ultrasound

## 2015-03-28 NOTE — Progress Notes (Signed)
Pre visit review using our clinic review tool, if applicable. No additional management support is needed unless otherwise documented below in the visit note. 

## 2015-04-09 LAB — HM COLONOSCOPY

## 2015-05-18 ENCOUNTER — Other Ambulatory Visit: Payer: Self-pay | Admitting: Internal Medicine

## 2015-08-16 ENCOUNTER — Encounter: Payer: 59 | Admitting: Internal Medicine

## 2015-09-19 ENCOUNTER — Telehealth: Payer: Self-pay | Admitting: Behavioral Health

## 2015-09-19 NOTE — Telephone Encounter (Signed)
Unable to reach patient at time of Pre-Visit Call.  Left message for patient to return call when available.    

## 2015-09-24 ENCOUNTER — Encounter: Payer: Self-pay | Admitting: Internal Medicine

## 2015-09-24 ENCOUNTER — Ambulatory Visit (INDEPENDENT_AMBULATORY_CARE_PROVIDER_SITE_OTHER): Payer: 59 | Admitting: Internal Medicine

## 2015-09-24 VITALS — BP 125/70 | HR 65 | Temp 98.0°F | Wt 241.0 lb

## 2015-09-24 DIAGNOSIS — Z0000 Encounter for general adult medical examination without abnormal findings: Secondary | ICD-10-CM

## 2015-09-24 DIAGNOSIS — Z1159 Encounter for screening for other viral diseases: Secondary | ICD-10-CM

## 2015-09-24 DIAGNOSIS — Z09 Encounter for follow-up examination after completed treatment for conditions other than malignant neoplasm: Secondary | ICD-10-CM

## 2015-09-24 LAB — COMPREHENSIVE METABOLIC PANEL
ALK PHOS: 44 U/L (ref 39–117)
ALT: 25 U/L (ref 0–53)
AST: 21 U/L (ref 0–37)
Albumin: 3.9 g/dL (ref 3.5–5.2)
BILIRUBIN TOTAL: 1 mg/dL (ref 0.2–1.2)
BUN: 22 mg/dL (ref 6–23)
CO2: 27 mEq/L (ref 19–32)
Calcium: 9.2 mg/dL (ref 8.4–10.5)
Chloride: 106 mEq/L (ref 96–112)
Creatinine, Ser: 1.34 mg/dL (ref 0.40–1.50)
GFR: 69.07 mL/min (ref >60.00)
Glucose, Bld: 94 mg/dL (ref 70–99)
Potassium: 4.3 mEq/L (ref 3.5–5.1)
Sodium: 140 mEq/L (ref 135–145)
TOTAL PROTEIN: 7.2 g/dL (ref 6.0–8.3)

## 2015-09-24 LAB — LIPID PANEL
Cholesterol: 182 mg/dL (ref 0–200)
HDL: 52.3 mg/dL (ref >39.00)
LDL Cholesterol: 112 mg/dL — ABNORMAL HIGH (ref 0–99)
NONHDL: 129.27
Total CHOL/HDL Ratio: 3
Triglycerides: 87 mg/dL (ref 0.0–149.0)
VLDL: 17.4 mg/dL (ref 0.0–40.0)

## 2015-09-24 LAB — TSH: TSH: 1.47 u[IU]/mL (ref 0.35–4.50)

## 2015-09-24 MED ORDER — HYDROCORTISONE 2.5 % EX CREA: TOPICAL_CREAM | Freq: Two times a day (BID) | CUTANEOUS | Status: DC

## 2015-09-24 NOTE — Assessment & Plan Note (Addendum)
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 Michail Sermon DRE 2015 (-), normal PSA -- reassess in 2017 Labs  FLP CMP HepC  TSH Discussed healthy lifestyle

## 2015-09-24 NOTE — Assessment & Plan Note (Signed)
HTN- seems well controlled Thyromegaly? -- has declined a [place] Eczema-- rec moisturizers and rx hydrocortisone  RTC 1 year

## 2015-09-24 NOTE — Progress Notes (Signed)
Pre visit review using our clinic review tool, if applicable. No additional management support is needed unless otherwise documented below in the visit note. 

## 2015-09-24 NOTE — Patient Instructions (Signed)
Get your blood work before you leave   Use a moisturizer daily and the cream as needed   Check the  blood pressure 2 or 3 times a month   Be sure your blood pressure is between 110/65 and  145/85.  if it is consistently higher or lower, let me know   Next visit  for a  Physical exam in 1 year     Please schedule an appointment at the front desk

## 2015-09-24 NOTE — Progress Notes (Signed)
Subjective:    Patient ID: Levi Jones, male    DOB: Feb 19, 1951, 64 y.o.   MRN: [id number]  DOS:  09/24/2015 Type of visit - description : cPX Interval history:  no major concerns   Review of Systems  Constitutional: No fever. No chills. No unexplained wt changes. No unusual sweats  HEENT: No dental problems, no ear discharge, no facial swelling, no voice changes. No eye discharge, no eye  redness , no  intolerance to light   Respiratory: No wheezing , no  difficulty breathing. No cough , no mucus production  Cardiovascular: No CP, no leg swelling , no  Palpitations  GI: no nausea, no vomiting, no diarrhea , no  abdominal pain.  No blood in the stools. No dysphagia, no odynophagia    Endocrine: No polyphagia, no polyuria , no polydipsia  GU: No dysuria, gross hematuria, difficulty urinating. Some nocturia  Musculoskeletal: No joint swellings or unusual aches or pains  Skin: has a spot at the scalp, has eczema at the legs on and off. Request a prescription.  Allergic, immunologic: No environmental allergies , no  food allergies  Neurological: No dizziness no  syncope. No headaches. No diplopia, no slurred, no slurred speech, no motor deficits, no facial  Numbness  Hematological: No enlarged lymph nodes, no easy bruising , no unusual bleedings  Psychiatry: No suicidal ideas, no hallucinations, no beavior problems, no confusion.  No unusual/severe anxiety, no depression  Past Medical History  Diagnosis Date  . Hypertension   . Osteoarthritis   . Sarcoidosis ([place])     h/o, DX in the 90's to early 2000s (Dr.Simonds)    Past Surgical History  Procedure Laterality Date  . Knee arthroscopy      Right x2  . Knee arthroscopy      Left x1  . Foot surgery Left      remotely, at [facility], podiatry.    Social History   Social History  . Marital Status: Married    Spouse Name: N/A  . Number of Children: 2  . Years of Education: N/A   Occupational History    . Sherrif    . [place] History Main Topics  . Smoking status: Never Smoker   . Smokeless tobacco: Never Used  . Alcohol Use: No  . Drug Use: No  . Sexual Activity: Not on file   Other Topics Concern  . Not on file   Social History Narrative    Household-- pt and wife   2 adult children   Family History  Problem Relation Age of Onset  . Lung cancer Father     smoker  . Coronary artery disease Mother     stents at age 18  . Hypertension Mother   . Heart failure Mother   . Prostate cancer Neg Hx   . Colon cancer Neg Hx   . Stroke Neg Hx    '     Medication List       This list is accurate as of: 09/24/15  7:16 PM.  Always use your most recent med list.               amLODipine 10 MG tablet  Commonly known as:  NORVASC  Take 1 tablet (10 mg total) by mouth daily.     hydrocortisone 2.5 % cream  Apply topically 2 (two) times daily.     loratadine 10 MG tablet  Commonly known as:  CLARITIN  TAKE 1 TABLET BY MOUTH EVERY DAY     losartan 100 MG tablet  Commonly known as:  COZAAR  Take 1 tablet (100 mg total) by mouth daily.           Objective:   Physical Exam  Skin:      BP 125/70 mmHg  Pulse 65  Temp(Src) 98 F (36.7 C)  Wt 241 lb (109.317 kg)  SpO2 93% General:   Well developed, well nourished . NAD.  Neck:  Mild  Thyromegaly, no nodular or TTP  HEENT:  Normocephalic . Face symmetric, atraumatic Lungs:  CTA B Normal respiratory effort, no intercostal retractions, no accessory muscle use. Heart: RRR,  no murmur.  No pretibial edema bilaterally  Abdomen:  Not distended, soft, non-tender. No rebound or rigidity. No mass,organomegaly Skin:  Neuro: alert & oriented X3.  Speech normal, gait appropriate for age and unassisted Strength symmetric and appropriate for age.  Psych: Cognition and judgment appear intact.  Cooperative with normal attention span and concentration.  Behavior appropriate. No  anxious or depressed appearing.    Assessment & Plan:   Assessment > HTN DJD Sarcoidosis, dx 1990s, Dr. Alva Garnet Thyromegaly? Eczema (L.E.)  PLAN: HTN- seems well controlled Thyromegaly? -- has declined a [place] Eczema-- rec moisturizers and rx hydrocortisone  RTC 1 year

## 2015-09-25 LAB — HEPATITIS C ANTIBODY: HCV Ab: NEGATIVE

## 2015-09-26 ENCOUNTER — Telehealth: Payer: Self-pay | Admitting: Internal Medicine

## 2015-09-26 NOTE — Telephone Encounter (Signed)
Labs not reviewed by Dr. Larose Kells as of yet. Will either call or send letter to Pt once reviewed.

## 2015-09-26 NOTE — Telephone Encounter (Signed)
Pt called for lab results. Advised they are not reviewed and he will receive a call after they are reviewed.

## 2015-10-06 ENCOUNTER — Other Ambulatory Visit: Payer: Self-pay | Admitting: Internal Medicine

## 2015-10-26 ENCOUNTER — Telehealth: Payer: Self-pay | Admitting: Internal Medicine

## 2015-10-26 NOTE — Telephone Encounter (Signed)
LMOM informing Pt and his wife that Pt will need to be seen before anything can be prescribed, informed them since we are closed on Monday due to holiday weekend, I recommend him to go to urgent care or the ED and not to wait until Tuesday when we return to the office. Informed Pt and/or wife to call office if questions.

## 2015-10-26 NOTE — Telephone Encounter (Signed)
Caller name: [name]   Relationship to patient: Spouse   Can be reached: [phone] (spouse)        [phone] (Spouse)  Pharmacy: [id number] - [place], [address]  Reason for call: Pt's wife called in stating that her husband has been vomiting and has a dry cough. Pt would like to know if PCP could call something into the pharmacy for him. Advised that pt would need to be seen. Pt still insist on message being sent for confirmation from PCP    Please assist further.

## 2015-11-07 ENCOUNTER — Telehealth: Payer: Self-pay | Admitting: Internal Medicine

## 2015-11-07 MED ORDER — IBUPROFEN 800 MG PO TABS: 800.0000 mg | ORAL_TABLET | Freq: Every day | ORAL | Status: DC | PRN

## 2015-11-07 NOTE — Telephone Encounter (Signed)
Rx sent 

## 2015-11-07 NOTE — Telephone Encounter (Signed)
Ok 90, no RF 

## 2015-11-07 NOTE — Telephone Encounter (Signed)
Pt is requesting refill on Ibuprofen 800 mg. No longer on med list.  Last OV: 09/24/2015 Last Fill: 05/21/2015 #90 and 0RF  Please advise.

## 2015-11-07 NOTE — Telephone Encounter (Signed)
Pharmacy: [id number] - [place], [place] [address]  Reason for call: Pt called for refill on ibuprofen 800mg . He uses prn for his knee pain when he plays basketball. He is out.

## 2015-12-03 ENCOUNTER — Other Ambulatory Visit: Payer: Self-pay | Admitting: Internal Medicine

## 2015-12-14 ENCOUNTER — Encounter: Payer: Self-pay | Admitting: Internal Medicine

## 2015-12-14 ENCOUNTER — Ambulatory Visit (INDEPENDENT_AMBULATORY_CARE_PROVIDER_SITE_OTHER): Payer: 59 | Admitting: Internal Medicine

## 2015-12-14 ENCOUNTER — Ambulatory Visit (HOSPITAL_BASED_OUTPATIENT_CLINIC_OR_DEPARTMENT_OTHER)
Admission: RE | Admit: 2015-12-14 | Discharge: 2015-12-14 | Disposition: A | Discharge status: Home / Self Care | Payer: 59 | Source: Ambulatory Visit | Attending: Internal Medicine | Admitting: Internal Medicine

## 2015-12-14 VITALS — BP 130/76 | HR 73 | Temp 98.0°F | Ht 71.0 in | Wt 239.5 lb

## 2015-12-14 DIAGNOSIS — M47894 Other spondylosis, thoracic region: Secondary | ICD-10-CM | POA: Diagnosis not present

## 2015-12-14 DIAGNOSIS — M546 Pain in thoracic spine: Secondary | ICD-10-CM | POA: Insufficient documentation

## 2015-12-14 DIAGNOSIS — Z09 Encounter for follow-up examination after completed treatment for conditions other than malignant neoplasm: Secondary | ICD-10-CM

## 2015-12-14 MED ORDER — PREDNISONE 10 MG PO TABS: ORAL_TABLET | ORAL | Status: DC

## 2015-12-14 NOTE — Patient Instructions (Signed)
  STOP BY THE FIRST FLOOR:  get the XR      Take prednisone as prescribed for few days  For pain, take Tylenol and ibuprofen as needed. See below  Tylenol  500 mg OTC 2 tabs a day every 8 hours as needed for pain  IBUPROFEN (Advil or Motrin) 200 mg 2 tablets every 6 hours as needed for pain.  Always take it with food because may cause gastritis and ulcers.  If you notice nausea, stomach pain, change in the color of stools --->  Stop the medicine and let us know    Call if not gradually improving in the next 2 weeks

## 2015-12-14 NOTE — Progress Notes (Signed)
Pre visit review using our clinic review tool, if applicable. No additional management support is needed unless otherwise documented below in the visit note. 

## 2015-12-14 NOTE — Progress Notes (Signed)
Subjective:    Patient ID: Levi Jones, male    DOB: [birth year], 65 y.o.   MRN: [id number]  DOS:  12/14/2015 Type of visit - description : Acute visit Interval history: 10 days history of pain at the left mid back, no radiation, denies any injury or fall. Does not take any medication for it. The pain change according to his position, occasionally decreases when he belches .   Review of Systems No  fever chills Had a URI 10 days ago but he is better, he did some coughing at the time but not severe. Denies any rash, abdominal pain. No upper or lower extremity paresthesias.  Past Medical History  Diagnosis Date  . Hypertension   . Osteoarthritis   . Sarcoidosis ([place])     h/o, DX in the 90's to early 2000s (Dr.Simonds)    Past Surgical History  Procedure Laterality Date  . Knee arthroscopy      Right x2  . Knee arthroscopy      Left x1  . Foot surgery Left      remotely, at [facility], podiatry.    Social History   Social History  . Marital Status: Married    Spouse Name: N/A  . Number of Children: 2  . Years of Education: N/A   Occupational History  . Sherrif    . [place] History Main Topics  . Smoking status: Never Smoker   . Smokeless tobacco: Never Used  . Alcohol Use: No  . Drug Use: No  . Sexual Activity: Not on file   Other Topics Concern  . Not on file   Social History Narrative    Household-- pt and wife   2 adult children        Medication List       This list is accurate as of: 12/14/15 11:59 PM.  Always use your most recent med list.               amLODipine 10 MG tablet  Commonly known as:  NORVASC  Take 1 tablet (10 mg total) by mouth daily.     hydrocortisone 2.5 % cream  Apply topically 2 (two) times daily.     ibuprofen 800 MG tablet  Commonly known as:  ADVIL,MOTRIN  Take 1 tablet (800 mg total) by mouth daily as needed for moderate pain. Always take with food.     loratadine 10 MG  tablet  Commonly known as:  CLARITIN  TAKE 1 TABLET BY MOUTH EVERY DAY     losartan 100 MG tablet  Commonly known as:  COZAAR  Take 1 tablet (100 mg total) by mouth daily.     predniSONE 10 MG tablet  Commonly known as:  DELTASONE  4 tablets x 2 days, 3 tabs x 2 days, 2 tabs x 2 days, 1 tab x 2 days           Objective:   Physical Exam  Musculoskeletal:       Arms:  BP 130/76 mmHg  Pulse 73  Temp(Src) 98 F (36.7 C) (Oral)  Ht 5\' 11"  (1.803 m)  Wt 239 lb 8 oz (108.636 kg)  BMI 33.42 kg/m2  SpO2 98% General:   Well developed, well nourished . NAD.  HEENT:  Normocephalic . Face symmetric, atraumatic Lungs:  CTA B Normal respiratory effort, no intercostal retractions, no accessory muscle use. Heart: RRR,  no murmur.  No pretibial edema bilaterally  Skin: Not  pale. Not jaundice. No rash MSK: No TTP at the thoracic spine. Neurologic:  alert & oriented X3.  Speech normal, gait appropriate for age and unassisted DTRs symmetric Psych--  Cognition and judgment appear intact.  Cooperative with normal attention span and concentration.  Behavior appropriate. No anxious or depressed appearing.      Assessment & Plan:   Assessment > HTN DJD Sarcoidosis, dx 1990s, Dr. Alva Garnet Thyromegaly? Eczema (L.E.)  PLAN: Thoracic back pain: Normal  exam, no red flag symptoms, likely MSK related  Recommend x-ray, prednisone, Tylenol or Motrin. Call if no gradually improving

## 2015-12-16 NOTE — Assessment & Plan Note (Signed)
Thoracic back pain: Normal  exam, no red flag symptoms, likely MSK related  Recommend x-ray, prednisone, Tylenol or Motrin. Call if no gradually improving

## 2015-12-25 LAB — BASIC METABOLIC PANEL: Creatinine: 1.5 mg/dL — AB (ref <=1.3)

## 2015-12-25 LAB — PSA: PSA: 0.27

## 2016-01-30 ENCOUNTER — Other Ambulatory Visit: Payer: Self-pay | Admitting: Internal Medicine

## 2016-02-04 ENCOUNTER — Encounter: Payer: Self-pay | Admitting: Internal Medicine

## 2016-06-24 ENCOUNTER — Ambulatory Visit (INDEPENDENT_AMBULATORY_CARE_PROVIDER_SITE_OTHER): Payer: 59 | Admitting: Internal Medicine

## 2016-06-24 ENCOUNTER — Ambulatory Visit (HOSPITAL_BASED_OUTPATIENT_CLINIC_OR_DEPARTMENT_OTHER)
Admission: RE | Admit: 2016-06-24 | Discharge: 2016-06-24 | Disposition: A | Discharge status: Home / Self Care | Payer: 59 | Source: Ambulatory Visit | Attending: Internal Medicine | Admitting: Internal Medicine

## 2016-06-24 ENCOUNTER — Telehealth: Payer: Self-pay | Admitting: Internal Medicine

## 2016-06-24 ENCOUNTER — Encounter: Payer: Self-pay | Admitting: Internal Medicine

## 2016-06-24 VITALS — BP 118/68 | HR 101 | Temp 98.5°F | Resp 14 | Ht 71.0 in | Wt 250.0 lb

## 2016-06-24 DIAGNOSIS — R509 Fever, unspecified: Secondary | ICD-10-CM | POA: Diagnosis not present

## 2016-06-24 DIAGNOSIS — D869 Sarcoidosis, unspecified: Secondary | ICD-10-CM | POA: Insufficient documentation

## 2016-06-24 LAB — COMPREHENSIVE METABOLIC PANEL
ALBUMIN: 3.8 g/dL (ref 3.5–5.2)
ALT: 34 U/L (ref 0–53)
AST: 33 U/L (ref 0–37)
Alkaline Phosphatase: 52 U/L (ref 39–117)
BUN: 19 mg/dL (ref 6–23)
CALCIUM: 8.6 mg/dL (ref 8.4–10.5)
CHLORIDE: 103 meq/L (ref 96–112)
CO2: 28 mEq/L (ref 19–32)
Creatinine, Ser: 1.41 mg/dL (ref 0.40–1.50)
GFR: 64.98 mL/min (ref >60.00)
Glucose, Bld: 164 mg/dL — ABNORMAL HIGH (ref 70–99)
POTASSIUM: 3.8 meq/L (ref 3.5–5.1)
Sodium: 135 mEq/L (ref 135–145)
Total Bilirubin: 1.7 mg/dL — ABNORMAL HIGH (ref 0.2–1.2)
Total Protein: 7.4 g/dL (ref 6.0–8.3)

## 2016-06-24 LAB — POCT URINALYSIS DIPSTICK
Blood, UA: NEGATIVE
Glucose, UA: NEGATIVE
Ketones, UA: NEGATIVE
Leukocytes, UA: NEGATIVE
Nitrite, UA: NEGATIVE
PROTEIN UA: NEGATIVE
SPEC GRAV UA: 1.025
UROBILINOGEN UA: 4
pH, UA: 6

## 2016-06-24 LAB — URINALYSIS, ROUTINE W REFLEX MICROSCOPIC
Bilirubin Urine: NEGATIVE
HGB URINE DIPSTICK: NEGATIVE
Ketones, ur: NEGATIVE
Leukocytes, UA: NEGATIVE
NITRITE: NEGATIVE
RBC / HPF: NONE SEEN (ref 0–?)
SPECIFIC GRAVITY, URINE: 1.02 (ref 1.000–1.030)
URINE GLUCOSE: NEGATIVE
Urobilinogen, UA: >=8 — AB (ref 0.0–1.0)
WBC, UA: NONE SEEN (ref 0–?)
pH: 6 (ref 5.0–8.0)

## 2016-06-24 LAB — CBC WITH DIFFERENTIAL/PLATELET
BASOS PCT: 0.3 % (ref 0.0–3.0)
Basophils Absolute: 0 10*3/uL (ref 0.0–0.1)
EOS PCT: 1.1 % (ref 0.0–5.0)
Eosinophils Absolute: 0.1 10*3/uL (ref 0.0–0.7)
HEMATOCRIT: 39.1 % (ref 39.0–52.0)
HEMOGLOBIN: 13 g/dL (ref 13.0–17.0)
LYMPHS PCT: 12 % (ref 12.0–46.0)
Lymphs Abs: 1.3 10*3/uL (ref 0.7–4.0)
MCHC: 33.2 g/dL (ref 30.0–36.0)
MCV: 84.3 fl (ref 78.0–100.0)
Monocytes Absolute: 1.1 10*3/uL — ABNORMAL HIGH (ref 0.1–1.0)
Monocytes Relative: 9.7 % (ref 3.0–12.0)
Neutro Abs: 8.5 10*3/uL — ABNORMAL HIGH (ref 1.4–7.7)
Neutrophils Relative %: 76.9 % (ref 43.0–77.0)
Platelets: 154 10*3/uL (ref 150.0–400.0)
RBC: 4.64 Mil/uL (ref 4.22–5.81)
RDW: 13.2 % (ref 11.5–15.5)
WBC: 11.1 10*3/uL — AB (ref 4.0–10.5)

## 2016-06-24 NOTE — Telephone Encounter (Signed)
Pt has an appt today (06/24/16) with Dr. Larose Kells at 11 am.

## 2016-06-24 NOTE — Progress Notes (Signed)
Subjective:    Patient ID: Levi Jones, male    DOB: Mar 24, 1951, 65 y.o.   MRN: [id number]  DOS:  06/24/2016 Type of visit - description : acute, Here with his wife and daughter  Interval history: Symptoms started late on 06/21/2016: Feeling chilly, cold on and off since then. Temperature has been checked at least twice, highest temperature has been 102.5. He was able to go to work yesterday but this morning the wife decided to bring him to the office. Denies any sick contacts, no recent tick bites. Has been taking Tylenol or ibuprofen as needed, last dose last night.   Review of Systems Denies sinus pain, congestion or nasal discharge No nausea, vomiting, diarrhea No cough No dysuria, gross hematuria, difficulty urinating. No rash or headache.   Past Medical History:  Diagnosis Date  . Benign localized hyperplasia of prostate with urinary obstruction   . Hypertension   . Nocturia   . Osteoarthritis   . Sarcoidosis ([place])    h/o, DX in the 90's to early 2000s (Dr.Simonds)    Past Surgical History:  Procedure Laterality Date  . FOOT SURGERY Left     remotely, at [facility], podiatry.  Marland Kitchen KNEE ARTHROSCOPY     Right x2  . KNEE ARTHROSCOPY     Left x1    Social History   Social History  . Marital status: Married    Spouse name: N/A  . Number of children: 2  . Years of education: N/A   Occupational History  . Sherrif    . [place] History Main Topics  . Smoking status: Never Smoker  . Smokeless tobacco: Never Used  . Alcohol use No  . Drug use: No  . Sexual activity: Not on file   Other Topics Concern  . Not on file   Social History Narrative    Household-- pt and wife   2 adult children        Medication List       Accurate as of 06/24/16 11:59 PM. Always use your most recent med list.          amLODipine 10 MG tablet Commonly known as:  NORVASC Take 1 tablet (10 mg total) by mouth daily.     hydrocortisone 2.5 % cream Apply topically 2 (two) times daily.   ibuprofen 800 MG tablet Commonly known as:  ADVIL,MOTRIN Take 1 tablet (800 mg total) by mouth daily as needed for moderate pain. Always take with food.   loratadine 10 MG tablet Commonly known as:  CLARITIN Take 1 tablet (10 mg total) by mouth daily as needed for allergies.   losartan 100 MG tablet Commonly known as:  COZAAR Take 1 tablet (100 mg total) by mouth daily.   tamsulosin 0.4 MG Caps capsule Commonly known as:  FLOMAX Take 1 capsule (0.4 mg total) by mouth at bedtime.          Objective:   Physical Exam BP 118/68 (BP Location: Left Arm, Patient Position: Sitting, Cuff Size: Normal)   Pulse (!) 101   Temp 98.5 F (36.9 C) (Oral)   Resp 14   Ht 5\' 11"  (1.803 m)   Wt 250 lb (113.4 kg)   SpO2 96%   BMI 34.87 kg/m  General:   Well developed, well nourished , nontoxic, no distress, comfortable appearing.Marland Kitchen  HEENT:  Normocephalic . Face symmetric, atraumatic. Throat symmetric, TMs normal, nose not congested, sinuses no TTP Neck: Full range of  motion Lungs:  CTA B Normal respiratory effort, no intercostal retractions, no accessory muscle use. Heart: RRR,  no murmur.  no pretibial edema bilaterally  Abdomen:  Not distended, soft, non-tender. No rebound or rigidity.  Skin: Not pale. Not jaundice Neurologic:  alert & oriented X3.  Speech normal, gait appropriate for age and unassisted Psych--  Cognition and judgment appear intact.  Cooperative with normal attention span and concentration.  Behavior appropriate. No anxious or depressed appearing.    Assessment & Plan:   Assessment > HTN DJD BPH last visit w/ urology 01-2016  Sarcoidosis, dx 1990s, Dr. Alva Garnet Thyromegaly? Eczema (L.E.)  PLAN: Febrile illness: ROS negative, physical exam benign. DDX viral fever, pneumonia, UTI, others. Udip negative. Plan: Check a CBC, CMP, chest x-ray and observation. See instructions.

## 2016-06-24 NOTE — Progress Notes (Signed)
Pre visit review using our clinic review tool, if applicable. No additional management support is needed unless otherwise documented below in the visit note. 

## 2016-06-24 NOTE — Telephone Encounter (Signed)
Appt scheduled w/ Dr. Larose Kells today at [place].

## 2016-06-24 NOTE — Patient Instructions (Signed)
GO TO THE LAB : Get the blood work     STOP BY THE FIRST FLOOR:  get the XR     Rest Drink plenty of fluids Take Tylenol as needed for fever, also okay to take occasional ibuprofen.  Call if not better in the next 2 or 3 days  Call anytime if you have high fever, severe headache, rash, cough, stomach pain or if you are not back to normal in 3-4 days.

## 2016-06-24 NOTE — Telephone Encounter (Signed)
[facility] Day - Client TELEPHONE ADVICE RECORD [facility] Patient Name: Levi Jones DOB: 06/27/1951 Initial Comment Caller states husband has temp 102. - CBWN: Caller is calling back to find out what to do for husbands temp Nurse Assessment Nurse: Dimas Chyle, RN, Dellis Filbert Date/Time Eilene Ghazi Time): 06/24/2016 8:51:37 AM Confirm and document reason for call. If symptomatic, describe symptoms. You must click the next button to save text entered. ---Caller states husband has temp 102. Fever since Sunday. Went away and came back. Highest temp of 102.5. Has the patient traveled out of the country within the last 30 days? ---No Does the patient have any new or worsening symptoms? ---Yes Will a triage be completed? ---Yes Related visit to physician within the last 2 weeks? ---No Does the PT have any chronic conditions? (i.e. diabetes, asthma, etc.) ---No Is this a behavioral health or substance abuse call? ---No Guidelines Guideline Title Affirmed Question Affirmed Notes Fever [1] Fever > 101 F (38.3 C) AND [2] age > 34 Final Disposition User See Physician within 4 Hours (or PCP triage) Dimas Chyle, RN, Dellis Filbert Referrals REFERRED TO PCP OFFICE Disagree/Comply: Leta Baptist

## 2016-06-24 NOTE — Telephone Encounter (Signed)
°  Reason for call: Wife [name] called stating that patient has a temp of 102. Transferred to [facility]. Gave all of the patients information and was informed that the nurse was not available but would call the patient back shortly. Informed wife that she would be getting a call in a few moments.

## 2016-06-25 ENCOUNTER — Emergency Department (HOSPITAL_COMMUNITY): Payer: 59

## 2016-06-25 ENCOUNTER — Emergency Department (HOSPITAL_COMMUNITY)
Admission: EM | Admit: 2016-06-25 | Discharge: 2016-06-25 | Disposition: A | Discharge status: Home / Self Care | Payer: 59 | Attending: Emergency Medicine | Admitting: Emergency Medicine

## 2016-06-25 DIAGNOSIS — I10 Essential (primary) hypertension: Secondary | ICD-10-CM | POA: Diagnosis not present

## 2016-06-25 DIAGNOSIS — R509 Fever, unspecified: Secondary | ICD-10-CM | POA: Insufficient documentation

## 2016-06-25 DIAGNOSIS — Z79899 Other long term (current) drug therapy: Secondary | ICD-10-CM | POA: Diagnosis not present

## 2016-06-25 DIAGNOSIS — Z791 Long term (current) use of non-steroidal anti-inflammatories (NSAID): Secondary | ICD-10-CM | POA: Diagnosis not present

## 2016-06-25 LAB — CBC WITH DIFFERENTIAL/PLATELET
Basophils Absolute: 0 10*3/uL (ref 0.0–0.1)
Basophils Relative: 0 %
EOS ABS: 0 10*3/uL (ref 0.0–0.7)
EOS PCT: 0 %
HCT: 35.5 % — ABNORMAL LOW (ref 39.0–52.0)
HEMOGLOBIN: 11.8 g/dL — AB (ref 13.0–17.0)
LYMPHS ABS: 1.2 10*3/uL (ref 0.7–4.0)
Lymphocytes Relative: 9 %
MCH: 27.4 pg (ref 26.0–34.0)
MCHC: 33.2 g/dL (ref 30.0–36.0)
MCV: 82.4 fL (ref 78.0–100.0)
MONO ABS: 1.2 10*3/uL — AB (ref 0.1–1.0)
MONOS PCT: 10 %
Neutro Abs: 10.1 10*3/uL — ABNORMAL HIGH (ref 1.7–7.7)
Neutrophils Relative %: 81 %
PLATELETS: 167 10*3/uL (ref 150–400)
RBC: 4.31 MIL/uL (ref 4.22–5.81)
RDW: 12.7 % (ref 11.5–15.5)
WBC: 12.5 10*3/uL — ABNORMAL HIGH (ref 4.0–10.5)

## 2016-06-25 LAB — COMPREHENSIVE METABOLIC PANEL
ALBUMIN: 3.4 g/dL — AB (ref 3.5–5.0)
ALK PHOS: 64 U/L (ref 38–126)
ALT: 45 U/L (ref 17–63)
AST: 45 U/L — ABNORMAL HIGH (ref 15–41)
Anion gap: 8 (ref 5–15)
BILIRUBIN TOTAL: 1.9 mg/dL — AB (ref 0.3–1.2)
BUN: 22 mg/dL — AB (ref 6–20)
CALCIUM: 8.8 mg/dL — AB (ref 8.9–10.3)
CO2: 22 mmol/L (ref 22–32)
CREATININE: 1.47 mg/dL — AB (ref 0.61–1.24)
Chloride: 107 mmol/L (ref 101–111)
GFR calc Af Amer: 56 mL/min — ABNORMAL LOW (ref >60)
GFR calc non Af Amer: 49 mL/min — ABNORMAL LOW (ref >60)
GLUCOSE: 111 mg/dL — AB (ref 65–99)
Potassium: 3.6 mmol/L (ref 3.5–5.1)
SODIUM: 137 mmol/L (ref 135–145)
Total Protein: 6.9 g/dL (ref 6.5–8.1)

## 2016-06-25 LAB — I-STAT CG4 LACTIC ACID, ED: Lactic Acid, Venous: 1.37 mmol/L (ref 0.5–1.9)

## 2016-06-25 LAB — URINALYSIS, ROUTINE W REFLEX MICROSCOPIC
Glucose, UA: NEGATIVE mg/dL
HGB URINE DIPSTICK: NEGATIVE
Ketones, ur: 15 mg/dL — AB
Nitrite: POSITIVE — AB
PROTEIN: NEGATIVE mg/dL
SPECIFIC GRAVITY, URINE: 1.033 — AB (ref 1.005–1.030)
pH: 5.5 (ref 5.0–8.0)

## 2016-06-25 LAB — URINE MICROSCOPIC-ADD ON

## 2016-06-25 LAB — URINE CULTURE: Organism ID, Bacteria: NO GROWTH

## 2016-06-25 LAB — MONONUCLEOSIS SCREEN: Mono Screen: NEGATIVE

## 2016-06-25 MED ORDER — IBUPROFEN 800 MG PO TABS
800.0000 mg | ORAL_TABLET | Freq: Once | ORAL | Status: AC
  Administered 2016-06-25: 800 mg via ORAL
  Filled 2016-06-25: qty 1

## 2016-06-25 NOTE — ED Notes (Signed)
Pt. Made aware for the need of urine. 

## 2016-06-25 NOTE — ED Triage Notes (Signed)
Pt states that he has been feeling bad since Saturday and has had a fever. Took tylenol at 8pm tonight but fever is still 103.2. Had labs and chest xray done at PCP and WBC were elevated. Alert and oriented.

## 2016-06-25 NOTE — ED Provider Notes (Signed)
[facility] DEPT Provider Note   CSN: [id number] Arrival date & time: 06/25/16  2047     History   Chief Complaint Chief Complaint  Patient presents with  . Fever    HPI Levi Jones is a 65 y.o. male.  The history is provided by the patient.  Fever   This is a new problem. Episode onset: 4 days ago. The problem occurs constantly. The problem has not changed since onset.The maximum temperature noted was 103 to 104 F. The temperature was taken using an oral thermometer. Pertinent negatives include no chest pain, no sleepiness, no diarrhea, no vomiting, no congestion, no headaches, no sore throat, no muscle aches and no cough. He has tried acetaminophen for the symptoms. The treatment provided mild relief.    Past Medical History:  Diagnosis Date  . Benign localized hyperplasia of prostate with urinary obstruction   . Hypertension   . Nocturia   . Osteoarthritis   . Sarcoidosis ([place])    h/o, DX in the 90's to early 2000s (Dr.Simonds)    Patient Active Problem List   Diagnosis Date Noted  . PCP NOTES >>>>> 09/24/2015  . Thyromegaly 03/28/2015  . Sciatica 09/27/2014  . Groin strain 09/27/2014  . 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 Annual physical exam 07/30/2011  . ALLERGIC RHINITIS CAUSE UNSPECIFIED 03/05/2010  . SARCOIDOSIS 01/02/2010  . HYPERTENSION 01/02/2010  . 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 OSTEOARTHRITIS 01/02/2010    Past Surgical History:  Procedure Laterality Date  . FOOT SURGERY Left     remotely, at [facility], podiatry.  Marland Kitchen KNEE ARTHROSCOPY     Right x2  . KNEE ARTHROSCOPY     Left x1       Home Medications    Prior to Admission medications   Medication Sig Start Date End Date Taking? Authorizing Provider  amLODipine (NORVASC) 10 MG tablet Take 1 tablet (10 mg total) by mouth daily. 12/03/15  Yes Colon Branch, MD  ibuprofen (ADVIL,MOTRIN) 200 MG tablet Take 200 mg by mouth every 6 (six)  hours as needed for moderate pain.   Yes Historical Provider, MD  loratadine (CLARITIN) 10 MG tablet Take 1 tablet (10 mg total) by mouth daily as needed for allergies. 01/30/16  Yes Colon Branch, MD  losartan (COZAAR) 100 MG tablet Take 1 tablet (100 mg total) by mouth daily. 10/08/15  Yes Colon Branch, MD  hydrocortisone 2.5 % cream Apply topically 2 (two) times daily. Patient not taking: Reported on 06/25/2016 09/24/15   Colon Branch, MD  ibuprofen (ADVIL,MOTRIN) 800 MG tablet Take 1 tablet (800 mg total) by mouth daily as needed for moderate pain. Always take with food. Patient not taking: Reported on 06/25/2016 11/07/15   Colon Branch, MD    Family History Family History  Problem Relation Age of Onset  . Lung cancer Father     smoker  . Coronary artery disease Mother     stents at age 4  . Hypertension Mother   . Heart failure Mother   . Prostate cancer Neg Hx   . Colon cancer Neg Hx   . Stroke Neg Hx     Social History Social History  Substance Use Topics  . Smoking status: Never Smoker  . Smokeless tobacco: Never Used  . Alcohol use No     Allergies   Review of patient's allergies indicates no known allergies.   Review of Systems Review of Systems  Constitutional:  Positive for fever.  HENT: Negative for congestion and sore throat.   Respiratory: Negative for cough.   Cardiovascular: Negative for chest pain.  Gastrointestinal: Negative for diarrhea and vomiting.  Neurological: Negative for headaches.  All other systems reviewed and are negative.    Physical Exam Updated Vital Signs BP 113/68 (BP Location: Left Arm)   Pulse 73   Temp 98.1 F (36.7 C) (Oral)   Resp 18   Ht 5\' 11"  (1.803 m)   Wt 265 lb (120.2 kg)   SpO2 96%   BMI 36.96 kg/m   Physical Exam  Constitutional: He is oriented to person, place, and time. He appears well-developed and well-nourished. No distress.  HENT:  Head: Normocephalic and atraumatic.  Mouth/Throat: Oropharynx is clear and moist.   Eyes: Conjunctivae are normal.  Neck: Neck supple. No tracheal deviation present.  Cardiovascular: Normal rate, regular rhythm and normal heart sounds.   No murmur heard. Pulmonary/Chest: Effort normal and breath sounds normal. No respiratory distress.  Abdominal: Soft. He exhibits no distension. There is no tenderness. There is no guarding.  Neurological: He is alert and oriented to person, place, and time.  Skin: Skin is warm and dry.  Psychiatric: He has a normal mood and affect.     ED Treatments / Results  Labs (all labs ordered are listed, but only abnormal results are displayed) Labs Reviewed  COMPREHENSIVE METABOLIC PANEL - Abnormal; Notable for the following:       Result Value   Glucose, Bld 111 (*)    BUN 22 (*)    Creatinine, Ser 1.47 (*)    Calcium 8.8 (*)    Albumin 3.4 (*)    AST 45 (*)    Total Bilirubin 1.9 (*)    GFR calc non Af Amer 49 (*)    GFR calc Af Amer 56 (*)    All other components within normal limits  CBC WITH DIFFERENTIAL/PLATELET - Abnormal; Notable for the following:    WBC 12.5 (*)    Hemoglobin 11.8 (*)    HCT 35.5 (*)    Neutro Abs 10.1 (*)    Monocytes Absolute 1.2 (*)    All other components within normal limits  URINALYSIS, ROUTINE W REFLEX MICROSCOPIC (NOT AT [facility]) - Abnormal; Notable for the following:    Color, Urine ORANGE (*)    Specific Gravity, Urine 1.033 (*)    Bilirubin Urine MODERATE (*)    Ketones, ur 15 (*)    Nitrite POSITIVE (*)    Leukocytes, UA SMALL (*)    All other components within normal limits  URINE MICROSCOPIC-ADD ON - Abnormal; Notable for the following:    Squamous Epithelial / LPF 0-5 (*)    Bacteria, UA RARE (*)    All other components within normal limits  CULTURE, BLOOD (ROUTINE X 2)  CULTURE, BLOOD (ROUTINE X 2)  URINE CULTURE  MONONUCLEOSIS SCREEN  ANTINUCLEAR ANTIBODIES, IFA  SEDIMENTATION RATE  ANTISTREPTOLYSIN O TITER  HIV ANTIBODY (ROUTINE TESTING)  I-STAT CG4 LACTIC ACID, ED    EKG   EKG Interpretation None       Radiology Dg Chest 2 View  Result Date: 06/24/2016 CLINICAL DATA:  Four days of fever without other symptoms. History of sarcoidosis, nonsmoker. EXAM: CHEST  2 VIEW COMPARISON:  Chest x-ray dated January 05, 2007 FINDINGS: The lungs are adequately inflated. The interstitial markings are mildly increased bilaterally. The heart and pulmonary vascularity are normal. The mediastinum is normal in width. There is calcification in  the wall of the aortic arch. The bony thorax is unremarkable. IMPRESSION: Mild increased interstitial density bilaterally likely reflecting known sarcoidosis. There is no alveolar pneumonia nor pulmonary parenchymal mass. Electronically Signed   By: David  Martinique M.D.   On: 06/24/2016 12:13    Procedures Procedures (including critical care time)  Medications Ordered in ED Medications  ibuprofen (ADVIL,MOTRIN) tablet 800 mg (800 mg Oral Given 06/25/16 2140)     Initial Impression / Assessment and Plan / ED Course  I have reviewed the triage vital signs and the nursing notes.  Pertinent labs & imaging results that were available during my care of the patient were reviewed by me and considered in my medical decision making (see chart for details).  Clinical Course   65 y.o. male presents with fever of unknown origin. No other symptoms except chills and elevated temperature. No tick bite exposure, no sick contacts, no recent travel. Screening labs performed only remarkable for mild hyperbilirubinemia and neutrophil predominant leukocytosis. Nitrite positive on UA likely noncontributory d/t discoloration from bili and mild dehydration without leukocytes in urine.  Suspect viral illness. Workup started for outpatient follow up to evaluate for autoimmune, infectious, or other causes of fever. No indication for admission currently, Pt hemodynamically stable and well appearing. Plan to follow up with PCP as needed and return precautions discussed for  worsening or new concerning symptoms.   Final Clinical Impressions(s) / ED Diagnoses   Final diagnoses:  Febrile illness    New Prescriptions New Prescriptions   No medications on file     Leo Grosser, MD 06/26/16 [phone]

## 2016-06-25 NOTE — Assessment & Plan Note (Signed)
Febrile illness: ROS negative, physical exam benign. DDX viral fever, pneumonia, UTI, others. Udip negative. Plan: Check a CBC, CMP, chest x-ray and observation. See instructions.

## 2016-06-26 ENCOUNTER — Telehealth: Payer: Self-pay | Admitting: Internal Medicine

## 2016-06-26 LAB — HIV ANTIBODY (ROUTINE TESTING W REFLEX): HIV Screen 4th Generation wRfx: NONREACTIVE

## 2016-06-26 LAB — SEDIMENTATION RATE: Sed Rate: 82 mm/hr — ABNORMAL HIGH (ref 0–16)

## 2016-06-26 NOTE — Telephone Encounter (Signed)
Relation to [id number] Call back [phone] Pharmacy:  Reason for call:  Patient wanted to inform PCP patient was seen in the ED and the ED MD advised to inform PCP. Patient would like to speak with a nurse. Patient has a ED follow up 06/27/16 with PCP. Please advise

## 2016-06-26 NOTE — Telephone Encounter (Signed)
Called pt, stated he did not need anything and had no concerns. Confirmed appt for tomorrow.

## 2016-06-27 ENCOUNTER — Ambulatory Visit (INDEPENDENT_AMBULATORY_CARE_PROVIDER_SITE_OTHER): Payer: 59 | Admitting: Internal Medicine

## 2016-06-27 ENCOUNTER — Ambulatory Visit (HOSPITAL_BASED_OUTPATIENT_CLINIC_OR_DEPARTMENT_OTHER)
Admission: RE | Admit: 2016-06-27 | Discharge: 2016-06-27 | Disposition: A | Discharge status: Home / Self Care | Payer: 59 | Source: Ambulatory Visit | Attending: Internal Medicine | Admitting: Internal Medicine

## 2016-06-27 ENCOUNTER — Encounter: Payer: Self-pay | Admitting: Internal Medicine

## 2016-06-27 VITALS — BP 122/66 | HR 83 | Temp 98.4°F | Resp 14 | Ht 71.0 in | Wt 250.0 lb

## 2016-06-27 DIAGNOSIS — R509 Fever, unspecified: Secondary | ICD-10-CM | POA: Insufficient documentation

## 2016-06-27 LAB — POCT INFLUENZA A/B
INFLUENZA A, POC: NEGATIVE
INFLUENZA B, POC: NEGATIVE

## 2016-06-27 LAB — ANTISTREPTOLYSIN O TITER: ASO: 37 [IU]/mL (ref 0.0–200.0)

## 2016-06-27 LAB — PSA: PSA: 0.23 ng/mL (ref 0.10–4.00)

## 2016-06-27 LAB — URINE CULTURE: CULTURE: NO GROWTH

## 2016-06-27 LAB — MONONUCLEOSIS SCREEN: Mono Screen: NEGATIVE

## 2016-06-27 LAB — POCT RAPID STREP A (OFFICE): Rapid Strep A Screen: NEGATIVE

## 2016-06-27 LAB — ANTINUCLEAR ANTIBODIES, IFA: ANA Ab, IFA: NEGATIVE

## 2016-06-27 NOTE — Progress Notes (Signed)
Pre visit review using our clinic review tool, if applicable. No additional management support is needed unless otherwise documented below in the visit note. 

## 2016-06-27 NOTE — Patient Instructions (Signed)
GO TO THE LAB : Get the blood work     GO TO THE FRONT DESK Schedule your next appointment for a  checkup in 5-6 days   Continue taking ibuprofen, Tylenol as needed  ER if: Severe fever, chills. Or if you have other symptoms such as headache, rash, nausea, diarrhea, blood in the stools, cough, joint or muscle aches. Feeling worse.

## 2016-06-27 NOTE — Progress Notes (Addendum)
Subjective:    Patient ID: Levi Jones, male    DOB: 1951/03/01, 65 y.o.   MRN: [id number]  DOS:  06/27/2016 Type of visit - description : ED follow-up Interval history: Was seen here 06/24/2016 with fever, workup was negative, went to the ER 06-25-16 with persistent fever and feeling poorly. At the ER, workup was essentially negative. Since then, continue with fever, up to 103.4, decrease with Tylenol or ibuprofen, only to resurface few hours later. When febrile, he feels "like crap", asked to describe how he feels and  he   simply feels really tired and w/ no appetite. When the fever is down he feels "decent".   Review of Systems  + Chills Denies nausea, vomiting, diarrhea. Some constipation No rash. No myalgias or arthralgias No cough, runny nose or sore throat No dysuria, gross hematuria. No headaches No history of tick bites, no h/o blood exposure   Past Medical History:  Diagnosis Date  . Benign localized hyperplasia of prostate with urinary obstruction   . Hypertension   . Nocturia   . Osteoarthritis   . 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 FOOT SURGERY Left     remotely, at [facility], podiatry.  Marland Kitchen KNEE ARTHROSCOPY     Right x2  . KNEE ARTHROSCOPY     Left x1    Social History   Social History  . Marital status: Married    Spouse name: N/A  . Number of children: 2  . Years of education: N/A   Occupational History  . Sherrif    . [place] History Main Topics  . Smoking status: Never Smoker  . Smokeless tobacco: Never Used  . Alcohol use No  . Drug use: No  . Sexual activity: Not on file   Other Topics Concern  . Not on file   Social History Narrative    Household-- pt and wife   2 adult children        Medication List       Accurate as of 06/27/16 11:59 PM. Always use your most recent med list.            amLODipine 10 MG tablet Commonly known as:  NORVASC Take 1 tablet (10 mg total) by mouth daily.   hydrocortisone 2.5 % cream Apply topically 2 (two) times daily.   ibuprofen 200 MG tablet Commonly known as:  ADVIL,MOTRIN Take 200 mg by mouth every 6 (six) hours as needed for moderate pain.   ibuprofen 800 MG tablet Commonly known as:  ADVIL,MOTRIN Take 1 tablet (800 mg total) by mouth daily as needed for moderate pain. Always take with food.   loratadine 10 MG tablet Commonly known as:  CLARITIN Take 1 tablet (10 mg total) by mouth daily as needed for allergies.   losartan 100 MG tablet Commonly known as:  COZAAR Take 1 tablet (100 mg total) by mouth daily.          Objective:   Physical Exam BP 122/66 (BP Location: Right Arm, Patient Position: Supine, Cuff Size: Normal)   Pulse 83   Temp 98.4 F (36.9 C) (Oral)   Resp 14   Ht 5\' 11"  (1.803 m)   Wt 250 lb (113.4 kg)   SpO2 94%   BMI 34.87 kg/m   General:   Well developed, well nourished started but nontoxic.Marland Kitchen  Neck: No  thyromegaly  LAD: No LAD side the neck, axillary area or groins. HEENT:  Normocephalic . Face symmetric, atraumatic. Nose not congested, so symmetric, no red Lungs:  CTA B Normal respiratory effort, no intercostal retractions, no accessory muscle use. Heart: RRR,  no murmur.  No pretibial edema bilaterally  Abdomen:  Not distended, soft, non-tender. No rebound or rigidity.   DRE: No stools found, prostate is difficult to reach but small, nontender. MSK: No synovitis on the wrist hands or elbows Skin: Exposed areas without rash. Not pale. Not jaundice Neurologic:  alert & oriented X3.  Speech normal, gait appropriate for age and unassisted Strength symmetric and appropriate for age.  Psych: Cognition and judgment appear intact.  Cooperative with normal attention span and concentration.  Behavior appropriate. No anxious or depressed appearing.    Assessment & Plan:   Assessment  > HTN DJD BPH last visit w/ urology 01-2016  Sarcoidosis, dx 1990s, Dr. Alva Garnet Thyromegaly? Eczema ([facility]  PLAN: Febrile illness, w/u at this office 3 days ago AND at the ER so far has includes negative urine culture, chest x-ray, CMP. Sedimentation rate was 82. BCX x 2  so far neg Today: Exam is benign including a normal prostate , ROS (-). Rapid strep test negative, flu test negative. Throat culture pending I spoke with ID to see what will be a reasonable next step. After that conversation my recommendation is: CMV, EBV, mono, PSA. Observation, ER if symptoms, see AVS Follow-up next week At some point if he continued to be febrile in the next 10 days may consider scans, r/o acute HIV, etc Addendum-- repeat CXR  Today, I spent more than 45  min with the patient: >50% of the time counseling regards rational behind further w/u, indications for ER visit, patient was concerned about the cost of workup and multiple questions asked about that, also reviewing the chart and labs ordered by other providers  -coordinating his care

## 2016-06-29 LAB — CULTURE, GROUP A STREP: ORGANISM ID, BACTERIA: NORMAL

## 2016-06-29 NOTE — Assessment & Plan Note (Signed)
Febrile illness, w/u at this office 3 days ago AND at the ER so far has includes negative urine culture, chest x-ray, CMP. Sedimentation rate was 82. BCX x 2  so far neg Today: Exam is benign including a normal prostate , ROS (-). Rapid strep test negative, flu test negative. Throat culture pending I spoke with ID to see what will be a reasonable next step. After that conversation my recommendation is: CMV, EBV, mono, PSA. Observation, ER if symptoms, see AVS Follow-up next week At some point if he continued to be febrile in the next 10 days may consider scans, r/o acute HIV, etc Addendum-- repeat CXR

## 2016-06-30 LAB — CULTURE, BLOOD (ROUTINE X 2)
CULTURE: NO GROWTH
Culture: NO GROWTH

## 2016-07-01 ENCOUNTER — Inpatient Hospital Stay (HOSPITAL_BASED_OUTPATIENT_CLINIC_OR_DEPARTMENT_OTHER)
Admission: EM | Admit: 2016-07-01 | Discharge: 2016-07-03 | DRG: 195 | Disposition: A | Discharge status: Home / Self Care | Payer: 59 | Attending: Internal Medicine | Admitting: Internal Medicine

## 2016-07-01 ENCOUNTER — Encounter (HOSPITAL_BASED_OUTPATIENT_CLINIC_OR_DEPARTMENT_OTHER): Payer: Self-pay

## 2016-07-01 ENCOUNTER — Ambulatory Visit: Payer: 59 | Admitting: Physician Assistant

## 2016-07-01 ENCOUNTER — Ambulatory Visit (INDEPENDENT_AMBULATORY_CARE_PROVIDER_SITE_OTHER): Payer: 59 | Admitting: Internal Medicine

## 2016-07-01 ENCOUNTER — Encounter: Payer: Self-pay | Admitting: Internal Medicine

## 2016-07-01 ENCOUNTER — Emergency Department (HOSPITAL_BASED_OUTPATIENT_CLINIC_OR_DEPARTMENT_OTHER): Payer: 59

## 2016-07-01 VITALS — BP 122/75 | HR 96 | Temp 100.4°F | Resp 18 | Ht 71.0 in | Wt 245.0 lb

## 2016-07-01 DIAGNOSIS — D509 Iron deficiency anemia, unspecified: Secondary | ICD-10-CM | POA: Diagnosis present

## 2016-07-01 DIAGNOSIS — R7989 Other specified abnormal findings of blood chemistry: Secondary | ICD-10-CM | POA: Diagnosis not present

## 2016-07-01 DIAGNOSIS — R945 Abnormal results of liver function studies: Secondary | ICD-10-CM

## 2016-07-01 DIAGNOSIS — Z86010 Personal history of colonic polyps: Secondary | ICD-10-CM

## 2016-07-01 DIAGNOSIS — J189 Pneumonia, unspecified organism: Secondary | ICD-10-CM

## 2016-07-01 DIAGNOSIS — R509 Fever, unspecified: Secondary | ICD-10-CM

## 2016-07-01 DIAGNOSIS — I10 Essential (primary) hypertension: Secondary | ICD-10-CM | POA: Diagnosis not present

## 2016-07-01 DIAGNOSIS — D869 Sarcoidosis, unspecified: Secondary | ICD-10-CM | POA: Diagnosis present

## 2016-07-01 DIAGNOSIS — Z8249 Family history of ischemic heart disease and other diseases of the circulatory system: Secondary | ICD-10-CM | POA: Diagnosis not present

## 2016-07-01 DIAGNOSIS — M1990 Unspecified osteoarthritis, unspecified site: Secondary | ICD-10-CM | POA: Diagnosis present

## 2016-07-01 DIAGNOSIS — Z79899 Other long term (current) drug therapy: Secondary | ICD-10-CM | POA: Diagnosis not present

## 2016-07-01 DIAGNOSIS — I129 Hypertensive chronic kidney disease with stage 1 through stage 4 chronic kidney disease, or unspecified chronic kidney disease: Secondary | ICD-10-CM | POA: Diagnosis present

## 2016-07-01 DIAGNOSIS — N182 Chronic kidney disease, stage 2 (mild): Secondary | ICD-10-CM | POA: Diagnosis present

## 2016-07-01 DIAGNOSIS — D649 Anemia, unspecified: Secondary | ICD-10-CM | POA: Diagnosis present

## 2016-07-01 DIAGNOSIS — J181 Lobar pneumonia, unspecified organism: Principal | ICD-10-CM | POA: Diagnosis present

## 2016-07-01 DIAGNOSIS — R740 Nonspecific elevation of levels of transaminase and lactic acid dehydrogenase [LDH]: Secondary | ICD-10-CM | POA: Diagnosis present

## 2016-07-01 DIAGNOSIS — N400 Enlarged prostate without lower urinary tract symptoms: Secondary | ICD-10-CM | POA: Diagnosis present

## 2016-07-01 DIAGNOSIS — B349 Viral infection, unspecified: Secondary | ICD-10-CM | POA: Diagnosis present

## 2016-07-01 HISTORY — DX: Pneumonia, unspecified organism: J18.9

## 2016-07-01 HISTORY — DX: Other specified abnormal findings of blood chemistry: R79.89

## 2016-07-01 HISTORY — DX: Abnormal results of liver function studies: R94.5

## 2016-07-01 LAB — COMPREHENSIVE METABOLIC PANEL
ALT: 79 U/L — ABNORMAL HIGH (ref 17–63)
AST: 76 U/L — ABNORMAL HIGH (ref 15–41)
Albumin: 2.8 g/dL — ABNORMAL LOW (ref 3.5–5.0)
Alkaline Phosphatase: 125 U/L (ref 38–126)
Anion gap: 9 (ref 5–15)
BUN: 14 mg/dL (ref 6–20)
CO2: 25 mmol/L (ref 22–32)
Calcium: 8.4 mg/dL — ABNORMAL LOW (ref 8.9–10.3)
Chloride: 103 mmol/L (ref 101–111)
Creatinine, Ser: 1.3 mg/dL — ABNORMAL HIGH (ref 0.61–1.24)
GFR calc Af Amer: >60 mL/min (ref >60)
GFR calc non Af Amer: 56 mL/min — ABNORMAL LOW (ref >60)
Glucose, Bld: 128 mg/dL — ABNORMAL HIGH (ref 65–99)
Potassium: 3.7 mmol/L (ref 3.5–5.1)
Sodium: 137 mmol/L (ref 135–145)
Total Bilirubin: 1.8 mg/dL — ABNORMAL HIGH (ref 0.3–1.2)
Total Protein: 7.6 g/dL (ref 6.5–8.1)

## 2016-07-01 LAB — CBC WITH DIFFERENTIAL/PLATELET
Band Neutrophils: 2 %
Basophils Absolute: 0 10*3/uL (ref 0.0–0.1)
Basophils Relative: 0 %
Eosinophils Absolute: 0.2 10*3/uL (ref 0.0–0.7)
Eosinophils Relative: 1 %
HCT: 33.3 % — ABNORMAL LOW (ref 39.0–52.0)
Hemoglobin: 11 g/dL — ABNORMAL LOW (ref 13.0–17.0)
Lymphocytes Relative: 10 %
Lymphs Abs: 1.6 10*3/uL (ref 0.7–4.0)
MCH: 27.6 pg (ref 26.0–34.0)
MCHC: 33 g/dL (ref 30.0–36.0)
MCV: 83.7 fL (ref 78.0–100.0)
Monocytes Absolute: 1.6 10*3/uL — ABNORMAL HIGH (ref 0.1–1.0)
Monocytes Relative: 10 %
Neutro Abs: 12.1 10*3/uL — ABNORMAL HIGH (ref 1.7–7.7)
Neutrophils Relative %: 77 %
Platelets: 281 10*3/uL (ref 150–400)
RBC: 3.98 MIL/uL — ABNORMAL LOW (ref 4.22–5.81)
RDW: 13.1 % (ref 11.5–15.5)
WBC: 15.5 10*3/uL — ABNORMAL HIGH (ref 4.0–10.5)

## 2016-07-01 LAB — URINALYSIS, ROUTINE W REFLEX MICROSCOPIC
Bilirubin Urine: NEGATIVE
Glucose, UA: NEGATIVE mg/dL
Hgb urine dipstick: NEGATIVE
Ketones, ur: NEGATIVE mg/dL
Leukocytes, UA: NEGATIVE
Nitrite: NEGATIVE
Protein, ur: NEGATIVE mg/dL
Specific Gravity, Urine: 1.009 (ref 1.005–1.030)
pH: 7 (ref 5.0–8.0)

## 2016-07-01 LAB — EPSTEIN-BARR VIRUS VCA ANTIBODY PANEL
EBV NA IGG: 99.1 U/mL — AB
EBV VCA IgG: 714 U/mL — ABNORMAL HIGH

## 2016-07-01 LAB — I-STAT CG4 LACTIC ACID, ED
Lactic Acid, Venous: 1.48 mmol/L (ref 0.5–1.9)
Lactic Acid, Venous: 1.33 mmol/L (ref 0.5–1.9)

## 2016-07-01 LAB — CMV IGM: CMV IgM: <30 AU/mL (ref <30.00)

## 2016-07-01 MED ORDER — LORATADINE 10 MG PO TABS: 10.0000 mg | ORAL_TABLET | Freq: Every day | ORAL | Status: DC | PRN

## 2016-07-01 MED ORDER — AZITHROMYCIN 500 MG IV SOLR
INTRAVENOUS | Status: AC
Start: 2016-07-01 — End: 2016-07-01
  Administered 2016-07-01: 18:00:00
  Filled 2016-07-01: qty 500

## 2016-07-01 MED ORDER — SODIUM CHLORIDE 0.9 % IV BOLUS (SEPSIS): 1000.0000 mL | Freq: Once | INTRAVENOUS | Status: DC

## 2016-07-01 MED ORDER — ACETAMINOPHEN 325 MG PO TABS
650.0000 mg | ORAL_TABLET | Freq: Four times a day (QID) | ORAL | Status: DC | PRN
  Administered 2016-07-01 – 2016-07-03 (×3): 650 mg via ORAL
  Filled 2016-07-01 (×3): qty 2

## 2016-07-01 MED ORDER — SODIUM CHLORIDE 0.9 % IV BOLUS (SEPSIS)
1000.0000 mL | Freq: Once | INTRAVENOUS | Status: AC
  Administered 2016-07-01: 1000 mL via INTRAVENOUS

## 2016-07-01 MED ORDER — DEXTROSE 5 % IV SOLN
500.0000 mg | Freq: Once | INTRAVENOUS | Status: AC
  Administered 2016-07-01: 500 mg via INTRAVENOUS

## 2016-07-01 MED ORDER — IPRATROPIUM BROMIDE 0.02 % IN SOLN: 0.5000 mg | Freq: Four times a day (QID) | RESPIRATORY_TRACT | Status: DC | PRN

## 2016-07-01 MED ORDER — DEXTROSE 5 % IV SOLN
500.0000 mg | INTRAVENOUS | Status: DC
  Administered 2016-07-02: 500 mg via INTRAVENOUS
  Filled 2016-07-01 (×2): qty 500

## 2016-07-01 MED ORDER — IBUPROFEN 200 MG PO TABS
200.0000 mg | ORAL_TABLET | Freq: Four times a day (QID) | ORAL | Status: DC | PRN
  Filled 2016-07-01: qty 1

## 2016-07-01 MED ORDER — DEXTROSE 5 % IV SOLN
1.0000 g | Freq: Once | INTRAVENOUS | Status: AC
  Administered 2016-07-01: 1 g via INTRAVENOUS
  Filled 2016-07-01: qty 10

## 2016-07-01 MED ORDER — ALBUTEROL SULFATE (2.5 MG/3ML) 0.083% IN NEBU: 2.5000 mg | INHALATION_SOLUTION | Freq: Four times a day (QID) | RESPIRATORY_TRACT | Status: DC | PRN

## 2016-07-01 MED ORDER — DEXTROSE 5 % IV SOLN
1.0000 g | INTRAVENOUS | Status: DC
  Administered 2016-07-02: 1 g via INTRAVENOUS
  Filled 2016-07-01 (×2): qty 10

## 2016-07-01 MED ORDER — LOSARTAN POTASSIUM 50 MG PO TABS
100.0000 mg | ORAL_TABLET | Freq: Every day | ORAL | Status: DC
  Administered 2016-07-02 – 2016-07-03 (×2): 100 mg via ORAL
  Filled 2016-07-01 (×2): qty 2

## 2016-07-01 MED ORDER — PANTOPRAZOLE SODIUM 40 MG PO TBEC
40.0000 mg | DELAYED_RELEASE_TABLET | Freq: Every day | ORAL | Status: DC
  Administered 2016-07-02 – 2016-07-03 (×2): 40 mg via ORAL
  Filled 2016-07-01 (×2): qty 1

## 2016-07-01 MED ORDER — SODIUM CHLORIDE 0.9 % IV SOLN
1000.0000 mL | INTRAVENOUS | Status: DC
  Administered 2016-07-01: 1000 mL via INTRAVENOUS

## 2016-07-01 MED ORDER — SODIUM CHLORIDE 0.9% FLUSH
3.0000 mL | Freq: Two times a day (BID) | INTRAVENOUS | Status: DC
  Administered 2016-07-01 – 2016-07-02 (×2): 3 mL via INTRAVENOUS

## 2016-07-01 MED ORDER — ACETAMINOPHEN 325 MG PO TABS
650.0000 mg | ORAL_TABLET | Freq: Once | ORAL | Status: AC
  Administered 2016-07-01: 650 mg via ORAL
  Filled 2016-07-01: qty 2

## 2016-07-01 MED ORDER — KETOROLAC TROMETHAMINE 30 MG/ML IJ SOLN
30.0000 mg | Freq: Once | INTRAMUSCULAR | Status: AC
  Administered 2016-07-01: 30 mg via INTRAVENOUS
  Filled 2016-07-01: qty 1

## 2016-07-01 MED ORDER — AMLODIPINE BESYLATE 10 MG PO TABS
10.0000 mg | ORAL_TABLET | Freq: Every day | ORAL | Status: DC
  Administered 2016-07-02 – 2016-07-03 (×2): 10 mg via ORAL
  Filled 2016-07-01 (×2): qty 1

## 2016-07-01 MED ORDER — ENOXAPARIN SODIUM 40 MG/0.4ML ~~LOC~~ SOLN: 40.0000 mg | Freq: Every day | SUBCUTANEOUS | Status: DC

## 2016-07-01 MED ORDER — SODIUM CHLORIDE 0.9 % IV SOLN
1000.0000 mL | INTRAVENOUS | Status: AC
  Administered 2016-07-02: 1000 mL via INTRAVENOUS

## 2016-07-01 MED ORDER — SODIUM CHLORIDE 0.9 % IV BOLUS (SEPSIS): 500.0000 mL | Freq: Once | INTRAVENOUS | Status: DC

## 2016-07-01 NOTE — ED Notes (Signed)
Code sepsis cancelled per Dr. Kathrynn Humble.

## 2016-07-01 NOTE — Progress Notes (Signed)
Pre visit review using our clinic review tool, if applicable. No additional management support is needed unless otherwise documented below in the visit note. 

## 2016-07-01 NOTE — ED Notes (Signed)
MD at bedside discussing test results and plan of care.

## 2016-07-01 NOTE — ED Notes (Signed)
Patient transported to X-ray 

## 2016-07-01 NOTE — ED Triage Notes (Addendum)
C/o fever x 12 days-n/v x 2-3 days, dry cough-NAD-presents to triage in w/c-does not use cane or walker-was sent from PCP office

## 2016-07-01 NOTE — Progress Notes (Signed)
Subjective:    Patient ID: Levi Jones, male    DOB: [birth year], 65 y.o.   MRN: [id number]  DOS:  07/01/2016 Type of visit - description :  Follow-up Interval history:  Patient come here today because he continue with fever, temperature has been as high as 104. The fever again is responsive to Tylenol or ibuprofen. Today, he reports some nausea and diarrhea in the last 2 days, diarrhea is described as loose stools.  Review of Systems Wife reports poor appetite. No blood in the stools or abdominal pain No rash No cough No headache but he does feel slightly off balance  Past Medical History:  Diagnosis Date  . Benign localized hyperplasia of prostate with urinary obstruction   . Hypertension   . Nocturia   . Osteoarthritis   . Sarcoidosis ([place])    h/o, DX in the 90's to early 2000s (Dr.Simonds)    Past Surgical History:  Procedure Laterality Date  . FOOT SURGERY Left     remotely, at [facility] [address] podiatry.  Marland Kitchen KNEE ARTHROSCOPY     Right x2  . KNEE ARTHROSCOPY     Left x1    Social History   Social History  . Marital status: Married    Spouse name: N/A  . Number of children: 2  . Years of education: N/A   Occupational History  . Sherrif    . [place] History Main Topics  . Smoking status: Never Smoker  . Smokeless tobacco: Never Used  . Alcohol use No  . Drug use: No  . Sexual activity: Not on file   Other Topics Concern  . Not on file   Social History Narrative    Household-- pt and wife   2 adult children        Medication List       Accurate as of 07/01/16  6:24 PM. Always use your most recent med list.          amLODipine 10 MG tablet Commonly known as:  NORVASC Take 1 tablet (10 mg total) by mouth daily.   hydrocortisone 2.5 % cream Apply topically 2 (two) times daily.   ibuprofen 200 MG tablet Commonly known as:  ADVIL,MOTRIN Take 200 mg by mouth every 6 (six) hours as needed for moderate pain.     ibuprofen 800 MG tablet Commonly known as:  ADVIL,MOTRIN Take 1 tablet (800 mg total) by mouth daily as needed for moderate pain. Always take with food.   loratadine 10 MG tablet Commonly known as:  CLARITIN Take 1 tablet (10 mg total) by mouth daily as needed for allergies.   losartan 100 MG tablet Commonly known as:  COZAAR Take 1 tablet (100 mg total) by mouth daily.          Objective:   Physical Exam BP 122/75 (BP Location: Left Arm, Patient Position: Supine, Cuff Size: Normal)   Pulse 96   Temp (!) 100.4 F (38 C) (Oral)   Resp 18   Ht 5\' 11"  (1.803 m)   Wt 245 lb (111.1 kg)   SpO2 95%   BMI 34.17 kg/m  General:   Well developed, well nourished , not toxic appearing but definitely weak, needs to have assistance by his family to walk  HEENT:  Normocephalic . Face symmetric, atraumatic Neck: Supple Lungs:  Mild amount of crackles, left base. Normal respiratory effort, no intercostal retractions, no accessory muscle use. Heart: RRR,  no murmur.  no  pretibial edema bilaterally  Abdomen:  Not distended, soft, non-tender. No rebound or rigidity. Increased breath sounds. Skin: Not pale. Not jaundice Neurologic:  alert & oriented X3.  Speech normal  Psych--  Cognition and judgment appear intact.  Cooperative with normal attention span and concentration.  Behavior appropriate. No anxious or depressed appearing.    Assessment & Plan:   Assessment > HTN DJD BPH last visit w/ urology 01-2016  Sarcoidosis, dx 1990s, Dr. Alva Garnet Thyromegaly? Eczema ([place]  PLAN: Febrile illness  Since the last time I saw him 4 days ago, a repeated chest x-ray showed no pneumonia, throat culture was negative, PSA normal, mono screen negative, EBV IGM negative. At this point, he looks clinically worse, has developed nausea and diarrhea, he has crackles at the left base. He probably is developing pneumonia despite 2 negative chest x-rays. Given the above, I think he needs  further evaluation, will send the patient to the ER, most likely will need to be admitted (If workup negative at the ER will need further inpatient eval for FUO, if workup shows pneumonia most likely will need IV antibiotics). I discussed the case with the physician assistant at the ER, the patient and his  family , all in  agreement.

## 2016-07-01 NOTE — Assessment & Plan Note (Signed)
Febrile illness  Since the last time I saw him 4 days ago, a repeated chest x-ray showed no pneumonia, throat culture was negative, PSA normal, mono screen negative, EBV IGM negative. At this point, he looks clinically worse, has developed nausea and diarrhea, he has crackles at the left base. He probably is developing pneumonia despite 2 negative chest x-rays. Given the above, I think he needs further evaluation, will send the patient to the ER, most likely will need to be admitted (If workup negative at the ER will need further inpatient eval for FUO, if workup shows pneumonia most likely will need IV antibiotics). I discussed the case with the physician assistant at the ER, the patient and his  family , all in  agreement.

## 2016-07-01 NOTE — H&P (Signed)
History and Physical    Levi Jones [id number] DOB: 02/05/1951 DOA: 07/01/2016  PCP: Kathlene November, MD   Patient coming from: [facility] ER, referred from clinic by PCP Kathlene November, MD  Chief Complaint: Fever x2 weeks.  HPI: Levi Jones is a 65 y.o. male with medical history significant of BPH, hypertension, osteoarthritis, sarcoidosis who was referred to the emergency department by his PCP for further evaluation of fever x2 weeks.   He has been seen and worked up by Dr. Larose Kells for FUO with CBC, CMP, blood cultures, viral testing, imaging which did not reveal any source of infection. The patient has had fever at home as high as 104F. For the past 2 days, the patient has had persistent dry cough, fatigue, mild dyspnea, nausea with occasional dry heaving, emesis 1 on Sunday morning and 4 episodes of diarrhea.  ED Course: WBC count was 15.5 K, CMP showed mild elevation of bilirubin and transaminases. Chest radiograph showed LLL consolidation. The patient received Rocephin and Zithromax IV.  Review of Systems: As per HPI otherwise 10 point review of systems negative.    Past Medical History:  Diagnosis Date  . Benign localized hyperplasia of prostate with urinary obstruction   . Hypertension   . Nocturia   . Osteoarthritis   . Sarcoidosis ([place])    h/o, DX in the 90's to early 2000s (Dr.Simonds)    Past Surgical History:  Procedure Laterality Date  . FOOT SURGERY Left     remotely, at [facility], podiatry.  Marland Kitchen KNEE ARTHROSCOPY     Right x2  . KNEE ARTHROSCOPY     Left x1     reports that he has never smoked. He has never used smokeless tobacco. He reports that he does not drink alcohol or use drugs.  No Known Allergies  Family History  Problem Relation Age of Onset  . Lung cancer Father     smoker  . Coronary artery disease Mother     stents at age 89  . Hypertension Mother   . Heart failure Mother   . Prostate cancer Neg Hx   . Colon cancer Neg Hx   . Stroke Neg Hx        Prior to Admission medications   Medication Sig Start Date End Date Taking? Authorizing Provider  amLODipine (NORVASC) 10 MG tablet Take 1 tablet (10 mg total) by mouth daily. 12/03/15  Yes Colon Branch, MD  ibuprofen (ADVIL,MOTRIN) 200 MG tablet Take 200 mg by mouth every 6 (six) hours as needed for moderate pain.   Yes Historical Provider, MD  loratadine (CLARITIN) 10 MG tablet Take 1 tablet (10 mg total) by mouth daily as needed for allergies. 01/30/16  Yes Colon Branch, MD  losartan (COZAAR) 100 MG tablet Take 1 tablet (100 mg total) by mouth daily. 10/08/15  Yes Colon Branch, MD  hydrocortisone 2.5 % cream Apply topically 2 (two) times daily. Patient not taking: Reported on 07/01/2016 09/24/15   Colon Branch, MD  ibuprofen (ADVIL,MOTRIN) 800 MG tablet Take 1 tablet (800 mg total) by mouth daily as needed for moderate pain. Always take with food. Patient not taking: Reported on 07/01/2016 11/07/15   Colon Branch, MD    Physical Exam: Vitals:   07/01/16 2023 07/01/16 2030 07/01/16 2128 07/01/16 2234  BP:  150/86  139/80  Pulse:  81  95  Resp:  21  18  Temp: 100.8 F (38.2 C)   99.9 F (37.7 C)  TempSrc:    Oral  SpO2:  96%  94%  Weight:   110.2 kg (243 lb)   Height:   5\' 11"  (1.803 m)       Constitutional: NAD, calm, comfortable Vitals:   07/01/16 2023 07/01/16 2030 07/01/16 2128 07/01/16 2234  BP:  150/86  139/80  Pulse:  81  95  Resp:  21  18  Temp: 100.8 F (38.2 C)   99.9 F (37.7 C)  TempSrc:    Oral  SpO2:  96%  94%  Weight:   110.2 kg (243 lb)   Height:   5\' 11"  (1.803 m)    Eyes: PERRL, lids and conjunctivae normal ENMT: Mucous membranes are moist. Posterior pharynx shows mild erythema..  Neck: normal, supple, no masses, no thyromegaly Respiratory: LLL and LML fields rales. No wheezing or ronchi. Normal respiratory effort. No accessory muscle use.  Cardiovascular: Regular rate and rhythm, no murmurs / rubs / gallops. No extremity edema. 2+ pedal pulses. No carotid  bruits.  Abdomen: no tenderness, no masses palpated. No hepatosplenomegaly. Bowel sounds positive.  Musculoskeletal: no clubbing / cyanosis. No joint deformity upper and lower extremities. Good ROM, no contractures. Normal muscle tone.  Skin: no rashes, lesions, ulcers. No induration Neurologic: CN 2-12 grossly intact. Sensation intact, DTR normal. Strength 5/5 in all 4.  Psychiatric: Normal judgment and insight. Alert and oriented x 4. Normal mood.     Labs on Admission: I have personally reviewed following labs and imaging studies  CBC:  Recent Labs Lab 06/25/16 2121 07/01/16 1606  WBC 12.5* 15.5*  NEUTROABS 10.1* 12.1*  HGB 11.8* 11.0*  HCT 35.5* 33.3*  MCV 82.4 83.7  PLT 167 [id number]   Basic Metabolic Panel:  Recent Labs Lab 06/25/16 2120 07/01/16 1606  NA 137 137  K 3.6 3.7  CL 107 103  CO2 22 25  GLUCOSE 111* 128*  BUN 22* 14  CREATININE 1.47* 1.30*  CALCIUM 8.8* 8.4*   GFR: Estimated Creatinine Clearance: 72.5 mL/min (by C-G formula based on SCr of 1.3 mg/dL). Liver Function Tests:  Recent Labs Lab 06/25/16 2120 07/01/16 1606  AST 45* 76*  ALT 45 79*  ALKPHOS 64 125  BILITOT 1.9* 1.8*  PROT 6.9 7.6  ALBUMIN 3.4* 2.8*    Urine analysis:    Component Value Date/Time   COLORURINE AMBER (A) 07/01/2016 1620   APPEARANCEUR CLEAR 07/01/2016 1620   LABSPEC 1.009 07/01/2016 1620   PHURINE 7.0 07/01/2016 1620   GLUCOSEU NEGATIVE 07/01/2016 1620   GLUCOSEU NEGATIVE 06/24/2016 1152   HGBUR NEGATIVE 07/01/2016 1620   HGBUR negative 03/07/2010 1018   BILIRUBINUR NEGATIVE 07/01/2016 1620   BILIRUBINUR Small 06/24/2016 1144   KETONESUR NEGATIVE 07/01/2016 1620   PROTEINUR NEGATIVE 07/01/2016 1620   UROBILINOGEN >=8.0 (A) 06/24/2016 1152   NITRITE NEGATIVE 07/01/2016 1620   LEUKOCYTESUR NEGATIVE 07/01/2016 1620    Recent Results (from the past 240 hour(s))  Urine Culture     Status: None   Collection Time: 06/24/16 11:52 AM  Result Value Ref Range Status    Organism ID, Bacteria NO GROWTH  Final  Culture, blood (Routine x 2)     Status: None   Collection Time: 06/25/16  9:25 PM  Result Value Ref Range Status   Specimen Description BLOOD LEFT ANTECUBITAL  Final   Special Requests BOTTLES DRAWN AEROBIC AND ANAEROBIC 5CC  Final   Culture   Final    NO GROWTH 5 DAYS Performed at [facility]  Report Status 06/30/2016 FINAL  Final  Culture, blood (Routine x 2)     Status: None   Collection Time: 06/25/16  9:42 PM  Result Value Ref Range Status   Specimen Description RIGHT ANTECUBITAL  Final   Special Requests BOTTLES DRAWN AEROBIC AND ANAEROBIC 10CC  Final   Culture   Final    NO GROWTH 5 DAYS Performed at [facility]    Report Status 06/30/2016 FINAL  Final  Urine culture     Status: None   Collection Time: 06/25/16 10:46 PM  Result Value Ref Range Status   Specimen Description URINE, RANDOM  Final   Special Requests NONE  Final   Culture NO GROWTH Performed at [facility]   Final   Report Status 06/27/2016 FINAL  Final  Culture, Group A Strep     Status: None   Collection Time: 06/27/16 10:51 AM  Result Value Ref Range Status   Organism ID, Bacteria Normal Upper Respiratory Flora  Final   Organism ID, Bacteria No Beta Hemolytic Streptococci Isolated  Final     Radiological Exams on Admission: Dg Chest 2 View  Result Date: 07/01/2016 CLINICAL DATA:  65 year old male with fever and cough for 13 days. Subsequent encounter. EXAM: CHEST  2 VIEW COMPARISON:  06/27/2016, 06/24/2016 and 01/05/2007. FINDINGS: Left lower lobe consolidation most suggestive of pneumonia. Follow-up until clearance recommended. Remainder of findings without change. Aorta partially calcified. Heart size within normal limits. No acute osseous abnormality. IMPRESSION: Left lower lobe consolidation most suggestive of pneumonia. Follow-up until clearance recommended. Remainder of findings without change. Aortic atherosclerosis.  Electronically Signed   By: Genia Del M.D.   On: 07/01/2016 17:14    EKG: Independently reviewed. Vent. rate 80 BPM PR interval * ms QRS duration 129 ms QT/QTc 410/473 ms P-R-T axes 53 2 -54 Sinus rhythm Ventricular premature complex IVCD, consider atypical LBBB  Assessment/Plan Principal Problem:   CAP (community acquired pneumonia) Admit to telemetry a/inpatient. Continue supplemental oxygen. Bronchodilators as needed. Continue ceftriaxone 1 g every 24 hours Continue azithromycin 500 mg every 24 hours.  Active Problems:   Sarcoidosis ([place]) Stable at this time. Bronchodilators as needed.    Essential hypertension Continue amlodipine 10 mg by mouth daily. Continue losartan 100 mg by mouth daily. Monitor blood pressure periodically.    Osteoarthritis Ibuprofen or acetaminophen as needed.    Abnormal LFTs Mildly elevated when compared to baseline. Follow-up LFTs in the morning and expand workup if they worsen.    DVT prophylaxis: Lovenox SQ. Code Status: Full code. Family Communication:  Disposition Plan: Admit for IV antibiotic therapy for several days. Consults called:  Admission status: Inpatient/telemetry.   Reubin Milan MD [facility] Pager [phone].  If 7PM-7AM, please contact night-coverage [email or web address] Password [email or web address]  07/01/2016, 11:10 PM

## 2016-07-01 NOTE — ED Provider Notes (Signed)
[place] DEPT [facility] Provider Note   CSN: [id number] Arrival date & time: 07/01/16  1406     History   Chief Complaint Chief Complaint  Patient presents with  . Fever    HPI Levi Jones is a 65 y.o. male who presents with a 2 week history of fever and 2 day history of dry cough, shortness of breath, nausea, diarrhea. Patient has been worked up for his fever of unknown origin with labs, blood cultures, viral testing-all negative. Patient's fever has been as high as 104. I received a call by patient's primary care provider, Dr. Larose Kells, who recommended repeat chest x-ray, as the patient had new crackles in the left base, as well as dry cough and shortness of breath. Dr. Larose Kells also states the patient seems much sicker than before. Dr. Larose Kells is recommending admission to the hospital. Patient states he has had a decreased appetite. He has been taking Tylenol and ibuprofen for his symptoms with only temporary relief of fever. Patient denies any chest pain, abdominal pain, vomiting, urinary symptoms.  HPI  Past Medical History:  Diagnosis Date  . Benign localized hyperplasia of prostate with urinary obstruction   . Hypertension   . Nocturia   . Osteoarthritis   . Sarcoidosis ([place])    h/o, DX in the 90's to early 2000s (Dr.Simonds)    Patient Active Problem List   Diagnosis Date Noted  . PCP NOTES >>>>> 09/24/2015  . Thyromegaly 03/28/2015  . Sciatica 09/27/2014  . Groin strain 09/27/2014  . 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 Annual physical exam 07/30/2011  . ALLERGIC RHINITIS CAUSE UNSPECIFIED 03/05/2010  . SARCOIDOSIS 01/02/2010  . HYPERTENSION 01/02/2010  . 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 OSTEOARTHRITIS 01/02/2010    Past Surgical History:  Procedure Laterality Date  . FOOT SURGERY Left     remotely, at [facility], podiatry.  Marland Kitchen KNEE ARTHROSCOPY     Right x2  . KNEE ARTHROSCOPY     Left x1       Home  Medications    Prior to Admission medications   Medication Sig Start Date End Date Taking? Authorizing Provider  amLODipine (NORVASC) 10 MG tablet Take 1 tablet (10 mg total) by mouth daily. 12/03/15   Colon Branch, MD  hydrocortisone 2.5 % cream Apply topically 2 (two) times daily. 09/24/15   Colon Branch, MD  ibuprofen (ADVIL,MOTRIN) 200 MG tablet Take 200 mg by mouth every 6 (six) hours as needed for moderate pain.    Historical Provider, MD  ibuprofen (ADVIL,MOTRIN) 800 MG tablet Take 1 tablet (800 mg total) by mouth daily as needed for moderate pain. Always take with food. 11/07/15   Colon Branch, MD  loratadine (CLARITIN) 10 MG tablet Take 1 tablet (10 mg total) by mouth daily as needed for allergies. 01/30/16   Colon Branch, MD  losartan (COZAAR) 100 MG tablet Take 1 tablet (100 mg total) by mouth daily. 10/08/15   Colon Branch, MD    Family History Family History  Problem Relation Age of Onset  . Lung cancer Father     smoker  . Coronary artery disease Mother     stents at age 75  . Hypertension Mother   . Heart failure Mother   . Prostate cancer Neg Hx   . Colon cancer Neg Hx   . Stroke Neg Hx     Social History Social History  Substance Use Topics  . Smoking status:  Never Smoker  . Smokeless tobacco: Never Used  . Alcohol use No     Allergies   Review of patient's allergies indicates no known allergies.   Review of Systems Review of Systems  Constitutional: Positive for appetite change and fever. Negative for chills.  HENT: Negative for facial swelling and sore throat.   Respiratory: Positive for cough and shortness of breath.   Cardiovascular: Negative for chest pain.  Gastrointestinal: Positive for diarrhea and nausea. Negative for abdominal pain, blood in stool and vomiting.  Genitourinary: Negative for dysuria.  Musculoskeletal: Negative for back pain.  Skin: Negative for rash and wound.  Neurological: Negative for headaches.  Psychiatric/Behavioral: The patient is  not nervous/anxious.      Physical Exam Updated Vital Signs BP 117/87 (BP Location: Left Arm)   Pulse 76   Temp 99.5 F (37.5 C) (Oral)   Resp 18   Ht 5\' 11"  (1.803 m)   Wt 110.2 kg   SpO2 95%   BMI 33.89 kg/m   Physical Exam  Constitutional: He appears well-developed and well-nourished. No distress.  HENT:  Head: Normocephalic and atraumatic.  Mouth/Throat: Oropharynx is clear and moist. No oropharyngeal exudate.  Eyes: Conjunctivae are normal. Pupils are equal, round, and reactive to light. Right eye exhibits no discharge. Left eye exhibits no discharge. No scleral icterus.  Neck: Normal range of motion. Neck supple. No thyromegaly present.  Cardiovascular: Normal rate, regular rhythm, normal heart sounds and intact distal pulses.  Exam reveals no gallop and no friction rub.   No murmur heard. Pulmonary/Chest: Effort normal. No stridor. No respiratory distress. He has no wheezes. He has rales (L mid and base).  Abdominal: Soft. Bowel sounds are normal. He exhibits no distension. There is no tenderness. There is no rebound and no guarding.  Musculoskeletal: He exhibits no edema.  Lymphadenopathy:    He has no cervical adenopathy.  Neurological: He is alert. Coordination normal.  Skin: Skin is warm and dry. No rash noted. He is not diaphoretic. No pallor.  Psychiatric: He has a normal mood and affect.  Nursing note and vitals reviewed.    ED Treatments / Results  Labs (all labs ordered are listed, but only abnormal results are displayed) Labs Reviewed  COMPREHENSIVE METABOLIC PANEL - Abnormal; Notable for the following:       Result Value   Glucose, Bld 128 (*)    Creatinine, Ser 1.30 (*)    Calcium 8.4 (*)    Albumin 2.8 (*)    AST 76 (*)    ALT 79 (*)    Total Bilirubin 1.8 (*)    GFR calc non Af Amer 56 (*)    All other components within normal limits  CBC WITH DIFFERENTIAL/PLATELET - Abnormal; Notable for the following:    WBC 15.5 (*)    RBC 3.98 (*)     Hemoglobin 11.0 (*)    HCT 33.3 (*)    Neutro Abs 12.1 (*)    Monocytes Absolute 1.6 (*)    All other components within normal limits  URINALYSIS, ROUTINE W REFLEX MICROSCOPIC (NOT AT [facility]) - Abnormal; Notable for the following:    Color, Urine AMBER (*)    All other components within normal limits  URINE CULTURE  I-STAT CG4 LACTIC ACID, ED    EKG  EKG Interpretation None       Radiology Dg Chest 2 View  Result Date: 07/01/2016 CLINICAL DATA:  65 year old male with fever and cough for 13 days. Subsequent encounter.  EXAM: CHEST  2 VIEW COMPARISON:  06/27/2016, 06/24/2016 and 01/05/2007. FINDINGS: Left lower lobe consolidation most suggestive of pneumonia. Follow-up until clearance recommended. Remainder of findings without change. Aorta partially calcified. Heart size within normal limits. No acute osseous abnormality. IMPRESSION: Left lower lobe consolidation most suggestive of pneumonia. Follow-up until clearance recommended. Remainder of findings without change. Aortic atherosclerosis. Electronically Signed   By: Genia Del M.D.   On: 07/01/2016 17:14    Procedures Procedures (including critical care time)  Medications Ordered in ED Medications  0.9 %  sodium chloride infusion (not administered)  azithromycin (ZITHROMAX) 500 mg in dextrose 5 % 250 mL IVPB (500 mg Intravenous New Bag/Given 07/01/16 1742)  azithromycin (ZITHROMAX) 500 MG injection (not administered)  sodium chloride 0.9 % bolus 1,000 mL (1,000 mLs Intravenous New Bag/Given 07/01/16 1711)  cefTRIAXone (ROCEPHIN) 1 g in dextrose 5 % 50 mL IVPB (0 g Intravenous Stopped 07/01/16 1800)     Initial Impression / Assessment and Plan / ED Course  I have reviewed the triage vital signs and the nursing notes.  Pertinent labs & imaging results that were available during my care of the patient were reviewed by me and considered in my medical decision making (see chart for details).  Clinical Course   CBC shows WBC 15.5;  hemoglobin 11 which is decreased from August 30, 11.8. CMP shows glucose 128, creatinine 1.3, calcium 8.4, albumin 2.8, which is decreased from August 30, 3.4. AST 76, ALT 79, total bilirubin 1.8. UA negative. Lactic acid 1.48. CXR shows left lower lobe consolidation most suggestive of pneumonia. Rocephin and azithromycin IV initiated in the ED. 1 L bolus given in the ED. At shift change, patient care transferred to Dr. Roby Lofts for continued evaluation and determination of disposition. He will determine inpatient or outpatient treatment of pneumonia considering PCP requested admission.   Final Clinical Impressions(s) / ED Diagnoses   Final diagnoses:  CAP (community acquired pneumonia)    New Prescriptions New Prescriptions   No medications on file         Frederica Kuster, PA-C 07/01/16 [place], MD 07/07/16 [phone]

## 2016-07-01 NOTE — Patient Instructions (Signed)
Please go to the ER downstairs 

## 2016-07-01 NOTE — ED Notes (Addendum)
Pt with fever for 2 weeks, seen at PCP at [facility] . Pt states fever continues today at 101. Pt has had tylenol and ibuprofen this morning with last dose of ibuprofen at 1130. Pt reports dry cough. Denies N/V.

## 2016-07-01 NOTE — ED Notes (Signed)
Attempted to call report to the floor x1.  

## 2016-07-02 DIAGNOSIS — R7989 Other specified abnormal findings of blood chemistry: Secondary | ICD-10-CM

## 2016-07-02 DIAGNOSIS — D649 Anemia, unspecified: Secondary | ICD-10-CM

## 2016-07-02 DIAGNOSIS — I10 Essential (primary) hypertension: Secondary | ICD-10-CM

## 2016-07-02 DIAGNOSIS — J189 Pneumonia, unspecified organism: Secondary | ICD-10-CM

## 2016-07-02 HISTORY — DX: Anemia, unspecified: D64.9

## 2016-07-02 LAB — COMPREHENSIVE METABOLIC PANEL
ALK PHOS: 120 U/L (ref 38–126)
ALT: 73 U/L — ABNORMAL HIGH (ref 17–63)
ANION GAP: 8 (ref 5–15)
AST: 71 U/L — ABNORMAL HIGH (ref 15–41)
Albumin: 2.5 g/dL — ABNORMAL LOW (ref 3.5–5.0)
BUN: 16 mg/dL (ref 6–20)
CALCIUM: 8.3 mg/dL — AB (ref 8.9–10.3)
CO2: 25 mmol/L (ref 22–32)
CREATININE: 1.38 mg/dL — AB (ref 0.61–1.24)
Chloride: 111 mmol/L (ref 101–111)
GFR, EST NON AFRICAN AMERICAN: 53 mL/min — AB (ref >60)
Glucose, Bld: 185 mg/dL — ABNORMAL HIGH (ref 65–99)
Potassium: 3.7 mmol/L (ref 3.5–5.1)
SODIUM: 144 mmol/L (ref 135–145)
Total Bilirubin: 1.4 mg/dL — ABNORMAL HIGH (ref 0.3–1.2)
Total Protein: 7.2 g/dL (ref 6.5–8.1)

## 2016-07-02 LAB — URINE CULTURE: Culture: NO GROWTH

## 2016-07-02 LAB — CBC WITH DIFFERENTIAL/PLATELET
Basophils Absolute: 0 10*3/uL (ref 0.0–0.1)
Basophils Relative: 0 %
EOS ABS: 0.3 10*3/uL (ref 0.0–0.7)
EOS PCT: 2 %
HCT: 30.6 % — ABNORMAL LOW (ref 39.0–52.0)
HEMOGLOBIN: 10.2 g/dL — AB (ref 13.0–17.0)
LYMPHS ABS: 1.6 10*3/uL (ref 0.7–4.0)
LYMPHS PCT: 13 %
MCH: 27.3 pg (ref 26.0–34.0)
MCHC: 33.3 g/dL (ref 30.0–36.0)
MCV: 81.8 fL (ref 78.0–100.0)
MONOS PCT: 9 %
Monocytes Absolute: 1 10*3/uL (ref 0.1–1.0)
Neutro Abs: 8.9 10*3/uL — ABNORMAL HIGH (ref 1.7–7.7)
Neutrophils Relative %: 76 %
PLATELETS: 272 10*3/uL (ref 150–400)
RBC: 3.74 MIL/uL — ABNORMAL LOW (ref 4.22–5.81)
RDW: 13.2 % (ref 11.5–15.5)
WBC: 11.8 10*3/uL — ABNORMAL HIGH (ref 4.0–10.5)

## 2016-07-02 LAB — FERRITIN: FERRITIN: 1128 ng/mL — AB (ref 24–336)

## 2016-07-02 LAB — IRON AND TIBC
Iron: 19 ug/dL — ABNORMAL LOW (ref 45–182)
SATURATION RATIOS: 14 % — AB (ref 17.9–39.5)
TIBC: 139 ug/dL — AB (ref 250–450)
UIBC: 120 ug/dL

## 2016-07-02 LAB — MAGNESIUM: MAGNESIUM: 2.2 mg/dL (ref 1.7–2.4)

## 2016-07-02 LAB — RETICULOCYTES
RBC.: 3.74 MIL/uL — AB (ref 4.22–5.81)
RETIC CT PCT: 1.6 % (ref 0.4–3.1)
Retic Count, Absolute: 59.8 10*3/uL (ref 19.0–186.0)

## 2016-07-02 LAB — VITAMIN B12: VITAMIN B 12: 663 pg/mL (ref 180–914)

## 2016-07-02 LAB — PHOSPHORUS: PHOSPHORUS: 2.6 mg/dL (ref 2.5–4.6)

## 2016-07-02 LAB — FOLATE: Folate: 12.2 ng/mL (ref >5.9)

## 2016-07-02 LAB — STREP PNEUMONIAE URINARY ANTIGEN: Strep Pneumo Urinary Antigen: NEGATIVE

## 2016-07-02 MED ORDER — ENOXAPARIN SODIUM 60 MG/0.6ML ~~LOC~~ SOLN
0.5000 mg/kg | SUBCUTANEOUS | Status: DC
  Administered 2016-07-02: 55 mg via SUBCUTANEOUS
  Filled 2016-07-02 (×2): qty 0.6

## 2016-07-02 NOTE — Progress Notes (Signed)
PROGRESS NOTE                                                                                                                                                                                                             Patient Demographics:    Levi Jones, is a 65 y.o. male, DOB - January 13, 1951, [id number]  Admit date - 07/01/2016   Admitting Physician Lavina Hamman, MD  Outpatient Primary MD for the patient is Kathlene November, MD  L[facility] - [place]  Chief Complaint  Patient presents with  . Fever       Brief Narrative  65 year old male with history of hypertension, arthritis, sarcoidosis and BPH with fever for almost 10 days with a dry cough fatigue, dyspnea on exertion and some dry heaving. He had temperature as high as 104F, had chest x-ray done by PCP which was unremarkable. He was sent home but returned to the ED neck area with similar symptoms, workup done was unremarkable and sent home with suspicion for viral illness. He was again seen by PCP on the day of admission due to ongoing fever and sent to the ED. Chest x-ray was repeated which showed left lower lobe consolidation suggestive of pneumonia.   Subjective:    Patient reports feeling better. Had fever of 101.73F this morning.   Assessment  & Plan :    Principal Problem: Left lobar pneumonia Continue empiric Rocephin and azithromycin. Febrile this morning. Will send blood cultures.. Strep antigen negative. Check Legionella antigen. Supportive care with Tylenol and antitussives.   Active Problems:   Sarcoidosis ([place]) Asymptomatic. Not on any medications.    Essential hypertension Stable. Continue amlodipine and losartan.      Abnormal LFTs Possibly due to acute viral illness. Monitor for now.  Iron deficiency anemia Start supplement. Colonoscopy in  CK D stage II Renal function at baseline   Code Status : Full code  Family  Communication  : Wife at bedside  Disposition Plan  : Home possibly tomorrow if afebrile and improving  Barriers For Discharge : Active symptoms  Consults  : None  Procedures  : None  DVT Prophylaxis  :  Lovenox -  Lab Results  Component Value Date   PLT 272 07/02/2016    Antibiotics  :  Anti-infectives    Start  Dose/Rate Route Frequency Ordered Stop   07/02/16 1800  cefTRIAXone (ROCEPHIN) 1 g in dextrose 5 % 50 mL IVPB     1 g 100 mL/hr over 30 Minutes Intravenous Every 24 hours 07/01/16 2322 07/09/16 1759   07/02/16 1800  azithromycin (ZITHROMAX) 500 mg in dextrose 5 % 250 mL IVPB     500 mg 250 mL/hr over 60 Minutes Intravenous Every 24 hours 07/01/16 2322 07/09/16 1759   07/01/16 1730  cefTRIAXone (ROCEPHIN) 1 g in dextrose 5 % 50 mL IVPB     1 g 100 mL/hr over 30 Minutes Intravenous  Once 07/01/16 1720 07/01/16 1800   07/01/16 1730  azithromycin (ZITHROMAX) 500 mg in dextrose 5 % 250 mL IVPB     500 mg 250 mL/hr over 60 Minutes Intravenous  Once 07/01/16 1720 07/01/16 1847   07/01/16 1726  azithromycin (ZITHROMAX) 500 MG injection    Comments:  Simms, Marva   : cabinet override      07/01/16 1726 07/01/16 1742        Objective:   Vitals:   07/01/16 2234 07/02/16 0030 07/02/16 0605 07/02/16 1118  BP: 139/80  140/78   Pulse: 95  83   Resp: 18  18   Temp: 99.9 F (37.7 C) 99.6 F (37.6 C) 98.5 F (36.9 C) (!) 101.5 F (38.6 C)  TempSrc: Oral Oral Oral Oral  SpO2: 94%  98%   Weight:      Height:        Wt Readings from Last 3 Encounters:  07/01/16 110.2 kg (243 lb)  07/01/16 111.1 kg (245 lb)  06/27/16 113.4 kg (250 lb)     Intake/Output Summary (Last 24 hours) at 07/02/16 1202 Last data filed at 07/02/16 1100  Gross per 24 hour  Intake             1950 ml  Output              200 ml  Net             1750 ml     Physical Exam  Gen: not in distress HEENT: no pallor, moist mucosa, supple neck Chest: Left basilar crackles CVS: N S1&S2, no  murmurs, GI: soft, NT, ND, BS+ Musculoskeletal: warm, no edema     Data Review:    CBC  Recent Labs Lab 06/25/16 2121 07/01/16 1606 07/02/16 0602  WBC 12.5* 15.5* 11.8*  HGB 11.8* 11.0* 10.2*  HCT 35.5* 33.3* 30.6*  PLT 167 281 272  MCV 82.4 83.7 81.8  MCH 27.4 27.6 27.3  MCHC 33.2 33.0 33.3  RDW 12.7 13.1 13.2  LYMPHSABS 1.2 1.6 1.6  MONOABS 1.2* 1.6* 1.0  EOSABS 0.0 0.2 0.3  BASOSABS 0.0 0.0 0.0    Chemistries   Recent Labs Lab 06/25/16 2120 07/01/16 1606 07/02/16 0602  NA 137 137 144  K 3.6 3.7 3.7  CL 107 103 111  CO2 22 25 25   GLUCOSE 111* 128* 185*  BUN 22* 14 16  CREATININE 1.47* 1.30* 1.38*  CALCIUM 8.8* 8.4* 8.3*  MG  --   --  2.2  AST 45* 76* 71*  ALT 45 79* 73*  ALKPHOS 64 125 120  BILITOT 1.9* 1.8* 1.4*   ------------------------------------------------------------------------------------------------------------------ No results for input(s): CHOL, HDL, LDLCALC, TRIG, CHOLHDL, LDLDIRECT in the last 72 hours.  Lab Results  Component Value Date   HGBA1C 5.2 01/03/2010   ------------------------------------------------------------------------------------------------------------------ No results for input(s): TSH, T4TOTAL, T3FREE, THYROIDAB in the last  72 hours.  Invalid input(s): FREET3 ------------------------------------------------------------------------------------------------------------------  Recent Labs  07/02/16 0602  VITAMINB12 663  FOLATE 12.2  FERRITIN 1,128*  TIBC 139*  IRON 19*  RETICCTPCT 1.6    Coagulation profile No results for input(s): INR, PROTIME in the last 168 hours.  No results for input(s): DDIMER in the last 72 hours.  Cardiac Enzymes No results for input(s): CKMB, TROPONINI, MYOGLOBIN in the last 168 hours.  Invalid input(s): CK ------------------------------------------------------------------------------------------------------------------ No results found for: BNP  Inpatient  Medications  Scheduled Meds: . amLODipine  10 mg Oral Daily  . azithromycin  500 mg Intravenous Q24H  . cefTRIAXone (ROCEPHIN)  IV  1 g Intravenous Q24H  . enoxaparin (LOVENOX) injection  0.5 mg/kg Subcutaneous Q24H  . losartan  100 mg Oral Daily  . pantoprazole  40 mg Oral Daily  . sodium chloride flush  3 mL Intravenous Q12H   Continuous Infusions: . sodium chloride 1,000 mL (07/02/16 1018)   PRN Meds:.acetaminophen, albuterol, ibuprofen, ipratropium, loratadine  Micro Results Recent Results (from the past 240 hour(s))  Urine Culture     Status: None   Collection Time: 06/24/16 11:52 AM  Result Value Ref Range Status   Organism ID, Bacteria NO GROWTH  Final  Culture, blood (Routine x 2)     Status: None   Collection Time: 06/25/16  9:25 PM  Result Value Ref Range Status   Specimen Description BLOOD LEFT ANTECUBITAL  Final   Special Requests BOTTLES DRAWN AEROBIC AND ANAEROBIC 5CC  Final   Culture   Final    NO GROWTH 5 DAYS Performed at [facility]    Report Status 06/30/2016 FINAL  Final  Culture, blood (Routine x 2)     Status: None   Collection Time: 06/25/16  9:42 PM  Result Value Ref Range Status   Specimen Description RIGHT ANTECUBITAL  Final   Special Requests BOTTLES DRAWN AEROBIC AND ANAEROBIC 10CC  Final   Culture   Final    NO GROWTH 5 DAYS Performed at [facility]    Report Status 06/30/2016 FINAL  Final  Urine culture     Status: None   Collection Time: 06/25/16 10:46 PM  Result Value Ref Range Status   Specimen Description URINE, RANDOM  Final   Special Requests NONE  Final   Culture NO GROWTH Performed at [facility]   Final   Report Status 06/27/2016 FINAL  Final  Culture, Group A Strep     Status: None   Collection Time: 06/27/16 10:51 AM  Result Value Ref Range Status   Organism ID, Bacteria Normal Upper Respiratory Flora  Final   Organism ID, Bacteria No Beta Hemolytic Streptococci Isolated  Final    Radiology  Reports Dg Chest 2 View  Result Date: 07/01/2016 CLINICAL DATA:  65 year old male with fever and cough for 13 days. Subsequent encounter. EXAM: CHEST  2 VIEW COMPARISON:  06/27/2016, 06/24/2016 and 01/05/2007. FINDINGS: Left lower lobe consolidation most suggestive of pneumonia. Follow-up until clearance recommended. Remainder of findings without change. Aorta partially calcified. Heart size within normal limits. No acute osseous abnormality. IMPRESSION: Left lower lobe consolidation most suggestive of pneumonia. Follow-up until clearance recommended. Remainder of findings without change. Aortic atherosclerosis. Electronically Signed   By: Genia Del M.D.   On: 07/01/2016 17:14   Dg Chest 2 View  Result Date: 06/27/2016 CLINICAL DATA:  Fever and chills for 2 weeks EXAM: CHEST  2 VIEW COMPARISON:  June 24, 2016 FINDINGS: There is no  edema or consolidation. Heart size and pulmonary vascularity are normal. No adenopathy. No bone lesions. IMPRESSION: No edema or consolidation. Electronically Signed   By: Lowella Grip III M.D.   On: 06/27/2016 11:48   Dg Chest 2 View  Result Date: 06/24/2016 CLINICAL DATA:  Four days of fever without other symptoms. History of sarcoidosis, nonsmoker. EXAM: CHEST  2 VIEW COMPARISON:  Chest x-ray dated January 05, 2007 FINDINGS: The lungs are adequately inflated. The interstitial markings are mildly increased bilaterally. The heart and pulmonary vascularity are normal. The mediastinum is normal in width. There is calcification in the wall of the aortic arch. The bony thorax is unremarkable. IMPRESSION: Mild increased interstitial density bilaterally likely reflecting known sarcoidosis. There is no alveolar pneumonia nor pulmonary parenchymal mass. Electronically Signed   By: David  Martinique M.D.   On: 06/24/2016 12:13    Time Spent in minutes  25   Louellen Molder M.D on 07/02/2016 at 12:02 PM  Between 7am to 7pm - Pager - [phone]  After 7pm go to [email or web address]  - password [facility]  [facility] -  Office  [phone]

## 2016-07-02 NOTE — Care Management Note (Signed)
Case Management Note  Patient Details  Name: Levi Jones MRN: [id number] Date of Birth: 12-05-1950  Subjective/Objective:      Confirmed pna by cxay              Action/Plan: Home when stable  Expected Discharge Date:                  Expected Discharge Plan:  Home/Self Care  In-House Referral:  NA  Discharge planning Services  NA  Post Acute Care Choice:  NA Choice offered to:  NA  DME Arranged:  N/A DME Agency:  NA  HH Arranged:  NA HH Agency:  NA  Status of Service:  In process, will continue to follow  If discussed at Long Length of Stay Meetings, dates discussed:    Additional Comments:Date:  July 02, 2016 Chart reviewed for concurrent status and case management needs. Will continue to follow the patient for status change: Discharge Planning: following for needs Expected discharge date: [id number] [name], BSN, [place], [place]  [name], RN 07/02/2016, 11:41 AM

## 2016-07-03 DIAGNOSIS — N182 Chronic kidney disease, stage 2 (mild): Secondary | ICD-10-CM

## 2016-07-03 DIAGNOSIS — D869 Sarcoidosis, unspecified: Secondary | ICD-10-CM

## 2016-07-03 DIAGNOSIS — D509 Iron deficiency anemia, unspecified: Secondary | ICD-10-CM

## 2016-07-03 LAB — COMPREHENSIVE METABOLIC PANEL
ALBUMIN: 2.8 g/dL — AB (ref 3.5–5.0)
ALK PHOS: 119 U/L (ref 38–126)
ALT: 73 U/L — AB (ref 17–63)
ANION GAP: 9 (ref 5–15)
AST: 61 U/L — ABNORMAL HIGH (ref 15–41)
BUN: 15 mg/dL (ref 6–20)
CHLORIDE: 107 mmol/L (ref 101–111)
CO2: 23 mmol/L (ref 22–32)
CREATININE: 1.21 mg/dL (ref 0.61–1.24)
Calcium: 8.5 mg/dL — ABNORMAL LOW (ref 8.9–10.3)
GFR calc non Af Amer: >60 mL/min (ref >60)
GLUCOSE: 109 mg/dL — AB (ref 65–99)
Potassium: 3.4 mmol/L — ABNORMAL LOW (ref 3.5–5.1)
SODIUM: 139 mmol/L (ref 135–145)
Total Bilirubin: 1 mg/dL (ref 0.3–1.2)
Total Protein: 7.6 g/dL (ref 6.5–8.1)

## 2016-07-03 LAB — CBC WITH DIFFERENTIAL/PLATELET
BASOS PCT: 0 %
Basophils Absolute: 0.1 10*3/uL (ref 0.0–0.1)
EOS ABS: 0.5 10*3/uL (ref 0.0–0.7)
EOS PCT: 4 %
HCT: 31 % — ABNORMAL LOW (ref 39.0–52.0)
HEMOGLOBIN: 10.3 g/dL — AB (ref 13.0–17.0)
LYMPHS ABS: 2.1 10*3/uL (ref 0.7–4.0)
Lymphocytes Relative: 17 %
MCH: 27.1 pg (ref 26.0–34.0)
MCHC: 33.2 g/dL (ref 30.0–36.0)
MCV: 81.6 fL (ref 78.0–100.0)
Monocytes Absolute: 1 10*3/uL (ref 0.1–1.0)
Monocytes Relative: 8 %
NEUTROS PCT: 71 %
Neutro Abs: 8.9 10*3/uL — ABNORMAL HIGH (ref 1.7–7.7)
PLATELETS: 340 10*3/uL (ref 150–400)
RBC: 3.8 MIL/uL — AB (ref 4.22–5.81)
RDW: 13.4 % (ref 11.5–15.5)
WBC: 12.4 10*3/uL — AB (ref 4.0–10.5)

## 2016-07-03 LAB — LEGIONELLA PNEUMOPHILA SEROGP 1 UR AG: L. PNEUMOPHILA SEROGP 1 UR AG: NEGATIVE

## 2016-07-03 MED ORDER — LEVOFLOXACIN 750 MG PO TABS: 750.0000 mg | ORAL_TABLET | Freq: Every day | ORAL | 0 refills | Status: DC

## 2016-07-03 MED ORDER — FERROUS SULFATE 325 (65 FE) MG PO TABS: 325.0000 mg | ORAL_TABLET | Freq: Two times a day (BID) | ORAL | 0 refills | Status: DC

## 2016-07-03 MED ORDER — POTASSIUM CHLORIDE CRYS ER 20 MEQ PO TBCR
40.0000 meq | EXTENDED_RELEASE_TABLET | Freq: Once | ORAL | Status: AC
  Administered 2016-07-03: 40 meq via ORAL
  Filled 2016-07-03: qty 2

## 2016-07-03 NOTE — Progress Notes (Signed)
Went over d/c instructions with patient and his wife.  Both verbalized understanding.  Patient left hospital with wife via personal vehicle.

## 2016-07-03 NOTE — Progress Notes (Signed)
Date: July 03, 2016 Discharge orders checked for needs. No needs present at time of discharge. [name], RN, BSN, [place]   [phone]

## 2016-07-03 NOTE — Discharge Instructions (Signed)

## 2016-07-03 NOTE — Discharge Summary (Signed)
Physician Discharge Summary  Levi Jones [id number] DOB: [birth year] DOA: 07/01/2016  PCP: Kathlene November, MD  Admit date: 07/01/2016 Discharge date: 07/03/2016   Admitted From: home Disposition: home  Recommendations for Outpatient Follow-up:  1. Follow up with PCP in 1-2 weeks 2. Completes 7 days of antibiotics ( discharged on levaquin),  on 07/09/2016 3. Please obtain follow up CXR in 4 weeks to ensure resolution of Lt lower lobe consolidation. 4. Please check LFTs during outpt follow up.  Home Health: none Equipment/Devices: none  Discharge Condition: fair CODE STATUS: full code Diet recommendation: low sodium   Discharge Diagnoses:  Principal Problem:   CAP (community acquired pneumonia)   Active Problems:   Sarcoidosis ([place])   Essential hypertension   Osteoarthritis   Abnormal LFTs   Anemia, Iron deficiency CKD stage 2  Brief narrative/ HPI 65 year old male with history of hypertension, arthritis, sarcoidosis and BPH with fever for almost 10 days with a dry cough fatigue, dyspnea on exertion and some dry heaving. He had temperature as high as 104F, had chest x-ray done by PCP which was unremarkable. He was sent home but returned to the ED neck area with similar symptoms, workup done was unremarkable and sent home with suspicion for viral illness. He was again seen by PCP on the day of admission due to ongoing fever and sent to the ED. Chest x-ray was repeated which showed left lower lobe consolidation suggestive of pneumonia.  Hospital course  Principal Problem: Left lobar pneumonia Placed on empiric Rocephin and azithromycin. No fever past 24 hrs. Feels much better today.  Strep antigen negative. Preliminary blood cx without any growth. Will discharge home on oral Levaquin 750 mg daily to complete 7 day course of antibiotics.   Active Problems:   Sarcoidosis ([place]) Asymptomatic. Not on any medications.    Essential hypertension Stable. Continue amlodipine  and losartan.      Abnormal LFTs ?acute viral illness. Follow up with repeat labs as outpatient.  Iron deficiency anemia Start supplement. [facility] ( pedunculated polyp removed) and  2016 showing internal hemorrhoids. Will add iron supplement. I have instructed dpt to limit or avoid use of NSAIDs ( takes ibuprofen as needed for pain)  CK D stage II Renal function at baseline   Code Status : Full code  Family Communication  : Wife at bedside  Disposition Plan  : Home  Consults  : None  Procedures  : None     Discharge Instructions     Medication List    STOP taking these medications   hydrocortisone 2.5 % cream     TAKE these medications   amLODipine 10 MG tablet Commonly known as:  NORVASC Take 1 tablet (10 mg total) by mouth daily.   ferrous sulfate 325 (65 FE) MG tablet Take 1 tablet (325 mg total) by mouth 2 (two) times daily with a meal.   ibuprofen 200 MG tablet Commonly known as:  ADVIL,MOTRIN Take 200 mg by mouth every 6 (six) hours as needed for moderate pain. What changed:  Another medication with the same name was removed. Continue taking this medication, and follow the directions you see here.   levofloxacin 750 MG tablet Commonly known as:  LEVAQUIN Take 1 tablet (750 mg total) by mouth daily. Start taking on:  07/04/2016   loratadine 10 MG tablet Commonly known as:  CLARITIN Take 1 tablet (10 mg total) by mouth daily as needed for allergies.   losartan 100  MG tablet Commonly known as:  COZAAR Take 1 tablet (100 mg total) by mouth daily.       No Known Allergies      Procedures/Studies: Dg Chest 2 View  Result Date: 07/01/2016 CLINICAL DATA:  65 year old male with fever and cough for 13 days. Subsequent encounter. EXAM: CHEST  2 VIEW COMPARISON:  06/27/2016, 06/24/2016 and 01/05/2007. FINDINGS: Left lower lobe consolidation most suggestive of pneumonia. Follow-up until clearance recommended.  Remainder of findings without change. Aorta partially calcified. Heart size within normal limits. No acute osseous abnormality. IMPRESSION: Left lower lobe consolidation most suggestive of pneumonia. Follow-up until clearance recommended. Remainder of findings without change. Aortic atherosclerosis. Electronically Signed   By: Genia Del M.D.   On: 07/01/2016 17:14   Dg Chest 2 View  Result Date: 06/27/2016 CLINICAL DATA:  Fever and chills for 2 weeks EXAM: CHEST  2 VIEW COMPARISON:  June 24, 2016 FINDINGS: There is no edema or consolidation. Heart size and pulmonary vascularity are normal. No adenopathy. No bone lesions. IMPRESSION: No edema or consolidation. Electronically Signed   By: Lowella Grip III M.D.   On: 06/27/2016 11:48   Dg Chest 2 View  Result Date: 06/24/2016 CLINICAL DATA:  Four days of fever without other symptoms. History of sarcoidosis, nonsmoker. EXAM: CHEST  2 VIEW COMPARISON:  Chest x-ray dated January 05, 2007 FINDINGS: The lungs are adequately inflated. The interstitial markings are mildly increased bilaterally. The heart and pulmonary vascularity are normal. The mediastinum is normal in width. There is calcification in the wall of the aortic arch. The bony thorax is unremarkable. IMPRESSION: Mild increased interstitial density bilaterally likely reflecting known sarcoidosis. There is no alveolar pneumonia nor pulmonary parenchymal mass. Electronically Signed   By: David  Martinique M.D.   On: 06/24/2016 12:13       Subjective: Reports unable to sleep as he was urinating a lot overnight. Remains afebrile . Feels better overall  Discharge Exam: Vitals:   07/03/16 0545 07/03/16 1410  BP: 127/86 125/78  Pulse: (!) 59 80  Resp: 18 18  Temp: 99.2 F (37.3 C) 98.7 F (37.1 C)   Vitals:   07/02/16 1343 07/02/16 2034 07/03/16 0545 07/03/16 1410  BP: 103/76 129/68 127/86 125/78  Pulse: 85 79 (!) 59 80  Resp: 18 18 18 18   Temp: 98.4 F (36.9 C) 98.8 F (37.1 C) 99.2  F (37.3 C) 98.7 F (37.1 C)  TempSrc: Oral Oral Oral Oral  SpO2: 96% 98% 99% 99%  Weight:      Height:        Gen: not in distress HEENT: no pallor, moist mucosa, supple neck Chest: Left basilar crackles CVS: N S1&S2, no murmurs, GI: soft, NT, ND, BS+ Musculoskeletal: warm, no edema     The results of significant diagnostics from this hospitalization (including imaging, microbiology, ancillary and laboratory) are listed below for reference.     Microbiology: Recent Results (from the past 240 hour(s))  Urine Culture     Status: None   Collection Time: 06/24/16 11:52 AM  Result Value Ref Range Status   Organism ID, Bacteria NO GROWTH  Final  Culture, blood (Routine x 2)     Status: None   Collection Time: 06/25/16  9:25 PM  Result Value Ref Range Status   Specimen Description BLOOD LEFT ANTECUBITAL  Final   Special Requests BOTTLES DRAWN AEROBIC AND ANAEROBIC 5CC  Final   Culture   Final    NO GROWTH 5 DAYS Performed at  [facility]    Report Status 06/30/2016 FINAL  Final  Culture, blood (Routine x 2)     Status: None   Collection Time: 06/25/16  9:42 PM  Result Value Ref Range Status   Specimen Description RIGHT ANTECUBITAL  Final   Special Requests BOTTLES DRAWN AEROBIC AND ANAEROBIC 10CC  Final   Culture   Final    NO GROWTH 5 DAYS Performed at [facility]    Report Status 06/30/2016 FINAL  Final  Urine culture     Status: None   Collection Time: 06/25/16 10:46 PM  Result Value Ref Range Status   Specimen Description URINE, RANDOM  Final   Special Requests NONE  Final   Culture NO GROWTH Performed at [facility]   Final   Report Status 06/27/2016 FINAL  Final  Culture, Group A Strep     Status: None   Collection Time: 06/27/16 10:51 AM  Result Value Ref Range Status   Organism ID, Bacteria Normal Upper Respiratory Flora  Final   Organism ID, Bacteria No Beta Hemolytic Streptococci Isolated  Final  Urine culture     Status: None    Collection Time: 07/01/16  4:20 PM  Result Value Ref Range Status   Specimen Description URINE, CLEAN CATCH  Final   Special Requests NONE  Final   Culture NO GROWTH Performed at [facility]   Final   Report Status 07/02/2016 FINAL  Final     Labs: BNP (last 3 results) No results for input(s): BNP in the last 8760 hours. Basic Metabolic Panel:  Recent Labs Lab 07/01/16 1606 07/02/16 0602 07/03/16 0531  NA 137 144 139  K 3.7 3.7 3.4*  CL 103 111 107  CO2 25 25 23   GLUCOSE 128* 185* 109*  BUN 14 16 15   CREATININE 1.30* 1.38* 1.21  CALCIUM 8.4* 8.3* 8.5*  MG  --  2.2  --   PHOS  --  2.6  --    Liver Function Tests:  Recent Labs Lab 07/01/16 1606 07/02/16 0602 07/03/16 0531  AST 76* 71* 61*  ALT 79* 73* 73*  ALKPHOS 125 120 119  BILITOT 1.8* 1.4* 1.0  PROT 7.6 7.2 7.6  ALBUMIN 2.8* 2.5* 2.8*   No results for input(s): LIPASE, AMYLASE in the last 168 hours. No results for input(s): AMMONIA in the last 168 hours. CBC:  Recent Labs Lab 07/01/16 1606 07/02/16 0602 07/03/16 0531  WBC 15.5* 11.8* 12.4*  NEUTROABS 12.1* 8.9* 8.9*  HGB 11.0* 10.2* 10.3*  HCT 33.3* 30.6* 31.0*  MCV 83.7 81.8 81.6  PLT 281 272 340   Cardiac Enzymes: No results for input(s): CKTOTAL, CKMB, CKMBINDEX, TROPONINI in the last 168 hours. BNP: Invalid input(s): POCBNP CBG: No results for input(s): GLUCAP in the last 168 hours. D-Dimer No results for input(s): DDIMER in the last 72 hours. Hgb A1c No results for input(s): HGBA1C in the last 72 hours. Lipid Profile No results for input(s): CHOL, HDL, LDLCALC, TRIG, CHOLHDL, LDLDIRECT in the last 72 hours. Thyroid function studies No results for input(s): TSH, T4TOTAL, T3FREE, THYROIDAB in the last 72 hours.  Invalid input(s): FREET3 Anemia work up  Recent Labs  07/02/16 0602  VITAMINB12 663  FOLATE 12.2  FERRITIN 1,128*  TIBC 139*  IRON 19*  RETICCTPCT 1.6   Urinalysis    Component Value Date/Time    COLORURINE AMBER (A) 07/01/2016 1620   APPEARANCEUR CLEAR 07/01/2016 1620   LABSPEC 1.009 07/01/2016 1620  PHURINE 7.0 07/01/2016 1620   GLUCOSEU NEGATIVE 07/01/2016 1620   GLUCOSEU NEGATIVE 06/24/2016 1152   HGBUR NEGATIVE 07/01/2016 1620   HGBUR negative 03/07/2010 1018   BILIRUBINUR NEGATIVE 07/01/2016 1620   BILIRUBINUR Small 06/24/2016 1144   KETONESUR NEGATIVE 07/01/2016 1620   PROTEINUR NEGATIVE 07/01/2016 1620   UROBILINOGEN >=8.0 (A) 06/24/2016 1152   NITRITE NEGATIVE 07/01/2016 1620   LEUKOCYTESUR NEGATIVE 07/01/2016 1620   Sepsis Labs Invalid input(s): PROCALCITONIN,  WBC,  LACTICIDVEN Microbiology Recent Results (from the past 240 hour(s))  Urine Culture     Status: None   Collection Time: 06/24/16 11:52 AM  Result Value Ref Range Status   Organism ID, Bacteria NO GROWTH  Final  Culture, blood (Routine x 2)     Status: None   Collection Time: 06/25/16  9:25 PM  Result Value Ref Range Status   Specimen Description BLOOD LEFT ANTECUBITAL  Final   Special Requests BOTTLES DRAWN AEROBIC AND ANAEROBIC 5CC  Final   Culture   Final    NO GROWTH 5 DAYS Performed at [facility]    Report Status 06/30/2016 FINAL  Final  Culture, blood (Routine x 2)     Status: None   Collection Time: 06/25/16  9:42 PM  Result Value Ref Range Status   Specimen Description RIGHT ANTECUBITAL  Final   Special Requests BOTTLES DRAWN AEROBIC AND ANAEROBIC 10CC  Final   Culture   Final    NO GROWTH 5 DAYS Performed at [facility]    Report Status 06/30/2016 FINAL  Final  Urine culture     Status: None   Collection Time: 06/25/16 10:46 PM  Result Value Ref Range Status   Specimen Description URINE, RANDOM  Final   Special Requests NONE  Final   Culture NO GROWTH Performed at [facility]   Final   Report Status 06/27/2016 FINAL  Final  Culture, Group A Strep     Status: None   Collection Time: 06/27/16 10:51 AM  Result Value Ref Range Status   Organism ID,  Bacteria Normal Upper Respiratory Flora  Final   Organism ID, Bacteria No Beta Hemolytic Streptococci Isolated  Final  Urine culture     Status: None   Collection Time: 07/01/16  4:20 PM  Result Value Ref Range Status   Specimen Description URINE, CLEAN CATCH  Final   Special Requests NONE  Final   Culture NO GROWTH Performed at [facility]   Final   Report Status 07/02/2016 FINAL  Final     Time coordinating discharge: Over 30 minutes  SIGNED:   Louellen Molder, MD  [facility] 07/03/2016, 2:58 PM Pager   If 7PM-7AM, please contact night-coverage [email or web address] Password [email or web address]

## 2016-07-04 ENCOUNTER — Telehealth: Payer: Self-pay

## 2016-07-04 NOTE — Telephone Encounter (Signed)
Unable to reach patient at time for TCM Call.  Left message for patient to return call when available.

## 2016-07-07 LAB — CULTURE, BLOOD (ROUTINE X 2)
CULTURE: NO GROWTH
Culture: NO GROWTH

## 2016-07-07 NOTE — Telephone Encounter (Signed)
Please call pt, see how he is doing

## 2016-07-07 NOTE — Telephone Encounter (Signed)
Transition Care Management Follow-up Telephone Call  Per Discharge Summary: PCP: Kathlene November, MD  Admit date: 07/01/2016 Discharge date: 07/03/2016   Admitted From: home Disposition: home  Recommendations for Outpatient Follow-up:  1. Follow up with PCP in 1-2 weeks 2. Completes 7 days of antibiotics ( discharged on levaquin),  on 07/09/2016 3. Please obtain follow up CXR in 4 weeks to ensure resolution of Lt lower lobe consolidation. 4. Please check LFTs during outpt follow up.  Home Health: none Equipment/Devices: none  Discharge Condition: fair CODE STATUS: full code Diet recommendation: low sodium  --   How have you been since you were released from the hospital? "Well, I'm feeling better. Had a fever at first, but no fever now."   Do you understand why you were in the hospital? yes   Do you understand the discharge instructions? yes   Where were you discharged to? Home   Items Reviewed:  Medications reviewed: yes  Allergies reviewed: yes  Dietary changes reviewed: yes, heart healthy low sodium  Referrals reviewed: no, none made   Functional Questionnaire:   Activities of Daily Living (ADLs):   He states they are independent in the following: ambulation, bathing and hygiene, feeding, continence, grooming, toileting and dressing States they require assistance with the following: none   Any transportation issues/concerns?: no   Any patient concerns? no   Confirmed importance and date/time of follow-up visits scheduled yes  Provider Appointment booked with Dr. Kathlene November 07/08/16 @ 11:30am  Confirmed with patient if condition begins to worsen call PCP or go to the ER.  Patient was given the office number and encouraged to call back with question or concerns.  : yes

## 2016-07-08 ENCOUNTER — Ambulatory Visit (INDEPENDENT_AMBULATORY_CARE_PROVIDER_SITE_OTHER): Payer: 59 | Admitting: Internal Medicine

## 2016-07-08 ENCOUNTER — Encounter: Payer: Self-pay | Admitting: Internal Medicine

## 2016-07-08 VITALS — BP 122/74 | HR 79 | Temp 98.3°F | Resp 12 | Ht 71.0 in | Wt 238.2 lb

## 2016-07-08 DIAGNOSIS — J189 Pneumonia, unspecified organism: Secondary | ICD-10-CM | POA: Diagnosis not present

## 2016-07-08 DIAGNOSIS — D649 Anemia, unspecified: Secondary | ICD-10-CM

## 2016-07-08 NOTE — Patient Instructions (Signed)
    GO TO THE FRONT DESK Schedule labs to be done 2 weeks from today. That day you also go to the first floor and get a chest XR  Schedule your next appointment for a  Physical with me 2 months from today   Call if not back to normal in 10 days  Call if fever, chills, diarrhea, headache,  cough , chest pain

## 2016-07-08 NOTE — Progress Notes (Signed)
Pre visit review using our clinic review tool, if applicable. No additional management support is needed unless otherwise documented below in the visit note. 

## 2016-07-08 NOTE — Progress Notes (Signed)
Subjective:    Patient ID: Levi Jones, male    DOB: 1951-08-12, 65 y.o.   MRN: [id number]  DOS:  07/08/2016 Type of visit - description : TCM Interval history: Patient was admitted from 07/01/2016 to 07/03/2016. Diagnosed with left lobar pneumonia, During the hospital admission HTN was controlled, LFTs were elevated, he had iron deficiency, Rx iron. Was discharged on by mouth antibiotics Relevant labs, XRs: Last creatinine 1.2, potassium 3.4, WBC trending down , hemoglobin decreased to 10.3, normal platelets. LFTs is slightly elevated   Review of Systems Since he left the hospital, he finished the antibiotics. Last day he had fever was around 07/05/2016, since then he is afebrile. No chills No chest pain, difficulty breathing. No nausea, vomiting, diarrhea. He ended up having some cough at the time of admission but that is also improved.  Past Medical History:  Diagnosis Date  . Benign localized hyperplasia of prostate with urinary obstruction   . Hypertension   . Nocturia   . Osteoarthritis   . Sarcoidosis ([place])    h/o, DX in the 90's to early 2000s (Dr.Simonds)    Past Surgical History:  Procedure Laterality Date  . FOOT SURGERY Left     remotely, at [facility], podiatry.  Marland Kitchen KNEE ARTHROSCOPY     Right x2  . KNEE ARTHROSCOPY     Left x1    Social History   Social History  . Marital status: Married    Spouse name: N/A  . Number of children: 2  . Years of education: N/A   Occupational History  . Sherrif    . [place] History Main Topics  . Smoking status: Never Smoker  . Smokeless tobacco: Never Used  . Alcohol use No  . Drug use: No  . Sexual activity: Not on file   Other Topics Concern  . Not on file   Social History Narrative    Household-- pt and wife   2 adult children        Medication List       Accurate as of 07/08/16 11:59 PM. Always use your most recent med list.          amLODipine 10 MG  tablet Commonly known as:  NORVASC Take 1 tablet (10 mg total) by mouth daily.   ferrous sulfate 325 (65 FE) MG tablet Take 1 tablet (325 mg total) by mouth 2 (two) times daily with a meal.   ibuprofen 200 MG tablet Commonly known as:  ADVIL,MOTRIN Take 200 mg by mouth every 6 (six) hours as needed for moderate pain.   loratadine 10 MG tablet Commonly known as:  CLARITIN Take 1 tablet (10 mg total) by mouth daily as needed for allergies.   losartan 100 MG tablet Commonly known as:  COZAAR Take 1 tablet (100 mg total) by mouth daily.          Objective:   Physical Exam BP 122/74 (BP Location: Left Arm, Patient Position: Sitting, Cuff Size: Normal)   Pulse 79   Temp 98.3 F (36.8 C) (Oral)   Resp 12   Ht 5\' 11"  (1.803 m)   Wt 238 lb 4 oz (108.1 kg)   SpO2 97%   BMI 33.23 kg/m  General:   Well developed, well nourished. He definitely looks better than the last time I saw him. No distress.  HEENT:  Normocephalic . Face symmetric, atraumatic. Conjunctiva without pallor Lungs:  No crackles on today's exam, no wheezing.  Normal respiratory effort, no intercostal retractions, no accessory muscle use. Heart: RRR,  no murmur.  No pretibial edema bilaterally  Skin: Not pale. Not jaundice Neurologic:  alert & oriented X3.  Speech normal, gait appropriate for age and unassisted Psych--  Cognition and judgment appear intact.  Cooperative with normal attention span and concentration.  Behavior appropriate. No anxious or depressed appearing.      Assessment & Plan:   Assessment > HTN DJD BPH last visit w/ urology 01-2016  Sarcoidosis, dx 1990s, Dr. Alva Garnet Thyromegaly? Eczema (L.E.)  PLAN: Left lower lobe consolidation, pneumonia. Status post admission, IV and po abx. She seems to be recovering well. During the admission, LFTs noted to be elevated, hemoglobin low, iron low, ferritin increase. Also creatinine was a slightly increased from baseline. Plan: Rest for few  more days, okay to go back to work in 4-5 days. RTC 2 weeks for labs: CMP, CBC, iron, ferritin. Chest x-ray. Call anytime is improvement does not continue or he had fever, chills. See AVS. RTC 2 months for a CPX

## 2016-07-09 NOTE — Assessment & Plan Note (Signed)
Left lower lobe consolidation, pneumonia. Status post admission, IV and po abx. She seems to be recovering well. During the admission, LFTs noted to be elevated, hemoglobin low, iron low, ferritin increase. Also creatinine was a slightly increased from baseline. Plan: Rest for few more days, okay to go back to work in 4-5 days. RTC 2 weeks for labs: CMP, CBC, iron, ferritin. Chest x-ray. Call anytime is improvement does not continue or he had fever, chills. See AVS. RTC 2 months for a CPX

## 2016-07-16 ENCOUNTER — Inpatient Hospital Stay: Payer: 59 | Admitting: Internal Medicine

## 2016-07-22 ENCOUNTER — Ambulatory Visit (HOSPITAL_BASED_OUTPATIENT_CLINIC_OR_DEPARTMENT_OTHER)
Admission: RE | Admit: 2016-07-22 | Discharge: 2016-07-22 | Disposition: A | Discharge status: Home / Self Care | Payer: 59 | Source: Ambulatory Visit | Attending: Internal Medicine | Admitting: Internal Medicine

## 2016-07-22 ENCOUNTER — Other Ambulatory Visit (INDEPENDENT_AMBULATORY_CARE_PROVIDER_SITE_OTHER): Payer: 59

## 2016-07-22 ENCOUNTER — Other Ambulatory Visit: Payer: 59

## 2016-07-22 DIAGNOSIS — J189 Pneumonia, unspecified organism: Secondary | ICD-10-CM | POA: Insufficient documentation

## 2016-07-22 DIAGNOSIS — D649 Anemia, unspecified: Secondary | ICD-10-CM

## 2016-07-22 LAB — COMPREHENSIVE METABOLIC PANEL
ALT: 26 U/L (ref 0–53)
AST: 20 U/L (ref 0–37)
Albumin: 3.6 g/dL (ref 3.5–5.2)
Alkaline Phosphatase: 61 U/L (ref 39–117)
BILIRUBIN TOTAL: 0.9 mg/dL (ref 0.2–1.2)
BUN: 24 mg/dL — ABNORMAL HIGH (ref 6–23)
CALCIUM: 8.8 mg/dL (ref 8.4–10.5)
CHLORIDE: 106 meq/L (ref 96–112)
CO2: 30 mEq/L (ref 19–32)
CREATININE: 1.25 mg/dL (ref 0.40–1.50)
GFR: 74.65 mL/min (ref >60.00)
Glucose, Bld: 107 mg/dL — ABNORMAL HIGH (ref 70–99)
Potassium: 4.3 mEq/L (ref 3.5–5.1)
Sodium: 141 mEq/L (ref 135–145)
TOTAL PROTEIN: 7.5 g/dL (ref 6.0–8.3)

## 2016-07-22 LAB — CBC WITH DIFFERENTIAL/PLATELET
BASOS ABS: 0 10*3/uL (ref 0.0–0.1)
Basophils Relative: 0.4 % (ref 0.0–3.0)
EOS ABS: 0.5 10*3/uL (ref 0.0–0.7)
Eosinophils Relative: 8.7 % — ABNORMAL HIGH (ref 0.0–5.0)
HEMATOCRIT: 36.8 % — AB (ref 39.0–52.0)
HEMOGLOBIN: 12.2 g/dL — AB (ref 13.0–17.0)
LYMPHS PCT: 25.1 % (ref 12.0–46.0)
Lymphs Abs: 1.4 10*3/uL (ref 0.7–4.0)
MCHC: 33.2 g/dL (ref 30.0–36.0)
MCV: 84.1 fl (ref 78.0–100.0)
Monocytes Absolute: 0.5 10*3/uL (ref 0.1–1.0)
Monocytes Relative: 8.5 % (ref 3.0–12.0)
Neutro Abs: 3.3 10*3/uL (ref 1.4–7.7)
Neutrophils Relative %: 57.3 % (ref 43.0–77.0)
Platelets: 162 10*3/uL (ref 150.0–400.0)
RBC: 4.37 Mil/uL (ref 4.22–5.81)
RDW: 14.9 % (ref 11.5–15.5)
WBC: 5.8 10*3/uL (ref 4.0–10.5)

## 2016-07-22 LAB — FERRITIN: FERRITIN: 508.3 ng/mL — AB (ref 22.0–322.0)

## 2016-07-22 LAB — IRON: Iron: 75 ug/dL (ref 42–165)

## 2016-08-07 ENCOUNTER — Telehealth: Payer: Self-pay | Admitting: Internal Medicine

## 2016-08-07 NOTE — Telephone Encounter (Signed)
Pt's wife dropped off document to be filled out ([place] 5 pages). Pt would like to be called when document ready at [phone] or [phone]. Document put at front office tray.

## 2016-08-14 NOTE — Telephone Encounter (Signed)
Spoke w/ Pt, informed him that forms have been completed and placed at front desk for pick up. OV notes from 06/24/2016, 06/25/2016, 06/27/2016, 07/01/2016, Hospital notes from 07/01/2016 to 07/03/2016, and hosp follow-up notes from 07/08/2016 enclosed with paperwork. Pt verbalized understanding. Copy of forms sent for scanning.

## 2016-09-11 ENCOUNTER — Ambulatory Visit (INDEPENDENT_AMBULATORY_CARE_PROVIDER_SITE_OTHER): Payer: 59 | Admitting: Internal Medicine

## 2016-09-11 ENCOUNTER — Encounter: Payer: Self-pay | Admitting: Internal Medicine

## 2016-09-11 VITALS — BP 126/74 | HR 73 | Temp 97.8°F | Resp 12 | Ht 71.0 in | Wt 246.5 lb

## 2016-09-11 DIAGNOSIS — Z0000 Encounter for general adult medical examination without abnormal findings: Secondary | ICD-10-CM | POA: Diagnosis not present

## 2016-09-11 LAB — CBC WITH DIFFERENTIAL/PLATELET
BASOS ABS: 0.1 10*3/uL (ref 0.0–0.1)
BASOS PCT: 0.8 % (ref 0.0–3.0)
EOS ABS: 0.3 10*3/uL (ref 0.0–0.7)
Eosinophils Relative: 3.7 % (ref 0.0–5.0)
HEMATOCRIT: 43.3 % (ref 39.0–52.0)
HEMOGLOBIN: 14.2 g/dL (ref 13.0–17.0)
LYMPHS PCT: 30.6 % (ref 12.0–46.0)
Lymphs Abs: 2.4 10*3/uL (ref 0.7–4.0)
MCHC: 32.7 g/dL (ref 30.0–36.0)
MCV: 84.1 fl (ref 78.0–100.0)
Monocytes Absolute: 0.7 10*3/uL (ref 0.1–1.0)
Monocytes Relative: 9.5 % (ref 3.0–12.0)
Neutro Abs: 4.4 10*3/uL (ref 1.4–7.7)
Neutrophils Relative %: 55.4 % (ref 43.0–77.0)
Platelets: 174 10*3/uL (ref 150.0–400.0)
RBC: 5.15 Mil/uL (ref 4.22–5.81)
RDW: 13.6 % (ref 11.5–15.5)
WBC: 7.9 10*3/uL (ref 4.0–10.5)

## 2016-09-11 LAB — LIPID PANEL
CHOL/HDL RATIO: 3
Cholesterol: 209 mg/dL — ABNORMAL HIGH (ref 0–200)
HDL: 69.2 mg/dL (ref >39.00)
LDL CALC: 117 mg/dL — AB (ref 0–99)
NonHDL: 139.36
TRIGLYCERIDES: 113 mg/dL (ref 0.0–149.0)
VLDL: 22.6 mg/dL (ref 0.0–40.0)

## 2016-09-11 NOTE — Patient Instructions (Signed)
GO TO THE LAB : Get the blood work     GO TO THE FRONT DESK Schedule your next appointment for a  routine checkup in 6 months  

## 2016-09-11 NOTE — Progress Notes (Signed)
Subjective:    Patient ID: Levi Jones, male    DOB: 1951-02-09, 65 y.o.   MRN: [id number]  DOS:  09/11/2016 Type of visit - description : CPX Interval history: No major concerns, feeling well.   Review of Systems  Constitutional: No fever. No chills. No unexplained wt changes. No unusual sweats  HEENT: No dental problems, no ear discharge, no facial swelling, no voice changes. No eye discharge, no eye  redness , no  intolerance to light   Respiratory: No wheezing , no  difficulty breathing. No cough , no mucus production  Cardiovascular: No CP, no leg swelling , no  Palpitations  GI: no nausea, no vomiting, no diarrhea , no  abdominal pain.  No blood in the stools. No dysphagia, no odynophagia    Endocrine: No polyphagia, no polyuria , no polydipsia  GU: No dysuria, gross hematuria, difficulty urinating. No urinary urgency, no frequency.  Musculoskeletal: He plays basketball sometimes, complaining of pain at the medial aspect of the left knee. No swelling. The right knee bother him from time to time but is less noticeable  Skin: No change in the color of the skin, palor , no  Rash  Allergic, immunologic: No environmental allergies , no  food allergies  Neurological: No dizziness no  syncope. No headaches. No diplopia, no slurred, no slurred speech, no motor deficits, no facial  Numbness  Hematological: No enlarged lymph nodes, no easy bruising , no unusual bleedings  Psychiatry: No suicidal ideas, no hallucinations, no beavior problems, no confusion.  No unusual/severe anxiety, no depression  Past Medical History:  Diagnosis Date  . Benign localized hyperplasia of prostate with urinary obstruction   . Hypertension   . Nocturia   . Osteoarthritis   . Sarcoidosis ([place])    h/o, DX in the 90's to early 2000s (Dr.Simonds)    Past Surgical History:  Procedure Laterality Date  . FOOT SURGERY Left     remotely, at [facility], podiatry.  Marland Kitchen KNEE ARTHROSCOPY     Right x2  . KNEE ARTHROSCOPY     Left x1    Social History   Social History  . Marital status: Married    Spouse name: N/A  . Number of children: 2  . Years of education: N/A   Occupational History  . Sherrif    . [place] History Main Topics  . Smoking status: Never Smoker  . Smokeless tobacco: Never Used  . Alcohol use No  . Drug use: No  . Sexual activity: Not on file   Other Topics Concern  . Not on file   Social History Narrative    Household-- pt and wife   2 adult children     Family History  Problem Relation Age of Onset  . Lung cancer Father     smoker  . Coronary artery disease Mother     stents at age 92  . Hypertension Mother   . Heart failure Mother   . Prostate cancer Neg Hx   . Colon cancer Neg Hx   . Stroke Neg Hx        Medication List       Accurate as of 09/11/16 11:59 PM. Always use your most recent med list.          amLODipine 10 MG tablet Commonly known as:  NORVASC Take 1 tablet (10 mg total) by mouth daily.   ibuprofen 200 MG tablet Commonly known as:  ADVIL,MOTRIN Take 200 mg by mouth every 6 (six) hours as needed for moderate pain.   loratadine 10 MG tablet Commonly known as:  CLARITIN Take 1 tablet (10 mg total) by mouth daily as needed for allergies.   losartan 100 MG tablet Commonly known as:  COZAAR Take 1 tablet (100 mg total) by mouth daily.   tamsulosin 0.4 MG Caps capsule Commonly known as:  FLOMAX Take 1 capsule by mouth daily.          Objective:   Physical Exam BP 126/74 (BP Location: Left Arm, Patient Position: Sitting, Cuff Size: Normal)   Pulse 73   Temp 97.8 F (36.6 C) (Oral)   Resp 12   Ht 5\' 11"  (1.803 m)   Wt 246 lb 8 oz (111.8 kg)   SpO2 96%   BMI 34.38 kg/m   General:   Well developed, well nourished . NAD.  Neck: No  thyromegaly  HEENT:  Normocephalic . Face symmetric, atraumatic Lungs:  CTA B Normal respiratory effort, no intercostal  retractions, no accessory muscle use. Heart: RRR,  no murmur.  No pretibial edema bilaterally  Abdomen:  Not distended, soft, non-tender. No rebound or rigidity.   Skin: Exposed areas without rash. Not pale. Not jaundice MSK: Both legs with changes consistent with DJD. No effusion, they are symmetric. Slightly TTP at the medial aspect of the left knee. + pain triggered by pivoting the R knee Neurologic:  alert & oriented X3.  Speech normal, gait appropriate for age and unassisted Strength symmetric and appropriate for age.  Psych: Cognition and judgment appear intact.  Cooperative with normal attention span and concentration.  Behavior appropriate. No anxious or depressed appearing.    Assessment & Plan:   Assessment   HTN DJD BPH last visit w/ urology 01-2016  Sarcoidosis, dx 1990s, Dr. Alva Garnet Thyromegaly? Eczema (L.E.) LLL PNM 06-2016   PLAN: HTN: Seems controlled with losartan, last BMP satisfactory DJD: C/o left knee pain, meniscal tear? Recommend ice, judicious use of ibuprofen and see his orthopedic doctor if not better BPH: Reports no symptoms as long as he takes Flomax. Thyromegaly?:  normal exam today Left lower lobe consolidation, pneumonia. Last chest x-ray show significant improvement, he is now asx. Previously seen lab abnormalities essentially resolved except for mild anemia RTC 6 months

## 2016-09-11 NOTE — Progress Notes (Signed)
Pre visit review using our clinic review tool, if applicable. No additional management support is needed unless otherwise documented below in the visit note. 

## 2016-09-11 NOTE — Assessment & Plan Note (Addendum)
Td  2011; zostavax -- never got it; declined pnm or flu shot   Colonoscopy 2004 wnl Scope 02-2014, + Polyp, high-grade dysplasia Had a cscope ~ 03-2015, next per  Dr. Lynnae January cancer screening: DRE 06-2016 done for fever was  (-), normal PSAs; last urology visit 4- 2017, asx  Labs   : FLP, CBC Discussed healthy lifestyle

## 2016-09-12 ENCOUNTER — Other Ambulatory Visit: Payer: Self-pay | Admitting: Internal Medicine

## 2016-09-12 NOTE — Assessment & Plan Note (Signed)
HTN: Seems controlled with losartan, last BMP satisfactory DJD: C/o left knee pain, meniscal tear? Recommend ice, judicious use of ibuprofen and see his orthopedic doctor if not better BPH: Reports no symptoms as long as he takes Flomax. Thyromegaly?:  normal exam today Left lower lobe consolidation, pneumonia. Last chest x-ray show significant improvement, he is now asx. Previously seen lab abnormalities essentially resolved except for mild anemia RTC 6 months

## 2016-09-25 ENCOUNTER — Encounter: Payer: 59 | Admitting: Internal Medicine

## 2016-10-11 ENCOUNTER — Other Ambulatory Visit: Payer: Self-pay | Admitting: Internal Medicine

## 2016-11-21 ENCOUNTER — Telehealth: Payer: Self-pay | Admitting: Internal Medicine

## 2016-11-21 NOTE — Telephone Encounter (Signed)
Patient states that Dr. Larose Kells knows he gets this medication prescribed for use during his basketball season. He stated that the pharmacy told him they needed authorization to fill the prescription.

## 2016-11-21 NOTE — Telephone Encounter (Signed)
Ibuprofen 200mg  tab is an OTC medication he can get at any pharmacy.

## 2016-11-21 NOTE — Telephone Encounter (Signed)
Patient called to request a refill of ibuprofen (ADVIL,MOTRIN) 200 MG tablet  Please advise   [facility]acy: [id number] - [place], [place].

## 2016-11-21 NOTE — Telephone Encounter (Signed)
Okay to send a prescription ibuprofen 200 mg one by mouth 4 times a day when necessary #100, 1 refill

## 2016-11-21 NOTE — Telephone Encounter (Signed)
Please advise 

## 2016-11-24 MED ORDER — IBUPROFEN 200 MG PO TABS: 200.0000 mg | ORAL_TABLET | Freq: Four times a day (QID) | ORAL | 1 refills | Status: DC | PRN

## 2016-11-24 NOTE — Telephone Encounter (Signed)
Rx sent 

## 2016-11-24 NOTE — Addendum Note (Signed)
Addended byDamita Dunnings D on: 11/24/2016 07:31 AM   Modules accepted: Orders

## 2017-03-03 ENCOUNTER — Telehealth: Payer: Self-pay | Admitting: Internal Medicine

## 2017-03-03 NOTE — Telephone Encounter (Signed)
Relation to pt: self  Call back number:[phone]   Reason for call:   Patient employer would like patient to cut he's beard, employer will excuse patient beard only if PCP provides Dr. Radene Ou. Patient states when he cuts he's beard he experiences  hair bumps and patient would like PCP to write Dr. Note reflecting this, please advise

## 2017-03-03 NOTE — Telephone Encounter (Signed)
Please advise 

## 2017-03-04 ENCOUNTER — Ambulatory Visit (INDEPENDENT_AMBULATORY_CARE_PROVIDER_SITE_OTHER): Payer: Medicare Other | Admitting: Internal Medicine

## 2017-03-04 ENCOUNTER — Encounter: Payer: Self-pay | Admitting: Internal Medicine

## 2017-03-04 VITALS — BP 128/80 | HR 76 | Temp 98.4°F | Resp 14 | Ht 71.0 in | Wt 250.2 lb

## 2017-03-04 DIAGNOSIS — I10 Essential (primary) hypertension: Secondary | ICD-10-CM | POA: Diagnosis not present

## 2017-03-04 DIAGNOSIS — L738 Other specified follicular disorders: Secondary | ICD-10-CM

## 2017-03-04 DIAGNOSIS — R739 Hyperglycemia, unspecified: Secondary | ICD-10-CM | POA: Diagnosis not present

## 2017-03-04 MED ORDER — IBUPROFEN 800 MG PO TABS: ORAL_TABLET | ORAL | 0 refills | Status: DC

## 2017-03-04 MED ORDER — LOSARTAN POTASSIUM 100 MG PO TABS: 100.0000 mg | ORAL_TABLET | Freq: Every day | ORAL | 1 refills | Status: DC

## 2017-03-04 MED ORDER — AMLODIPINE BESYLATE 10 MG PO TABS: 10.0000 mg | ORAL_TABLET | Freq: Every day | ORAL | 1 refills | Status: DC

## 2017-03-04 NOTE — Progress Notes (Signed)
Pre visit review using our clinic review tool, if applicable. No additional management support is needed unless otherwise documented below in the visit note. 

## 2017-03-04 NOTE — Telephone Encounter (Signed)
Spouse informed regarding message below. Patient decied he would like to see PCP sooner, 03/11/17 appointment [facility] today at 2:45pm.

## 2017-03-04 NOTE — Patient Instructions (Signed)
GO TO THE LAB : Get the blood work     GO TO THE FRONT DESK Schedule your next appointment for a physical exam in 6 months, fasting.    

## 2017-03-04 NOTE — Telephone Encounter (Signed)
Noted  

## 2017-03-04 NOTE — Telephone Encounter (Signed)
Pt has appt scheduled 03/11/2017 w/ PCP. Per Dr. Larose Kells will discuss at time of OV.

## 2017-03-04 NOTE — Telephone Encounter (Signed)
Due for a routine checkup, will discuss when he comes back. If he has folliculitis, a note is in order

## 2017-03-04 NOTE — Progress Notes (Signed)
Subjective:    Patient ID: Levi Jones, male    DOB: 05-28-1951, 66 y.o.   MRN: [id number]  DOS:  03/04/2017 Type of visit - description : Routine visit Interval history:  HTN: Good compliance w/ medication, no ambulatory BPs BPH: Symptoms improve on Flomax The patient would like a letter stating that he has a hard time shaving daily, when he does he often times get "bumps" at the neck and chin that are very painful. Likely has folliculitis. DJD: Takes ibuprofen 800 mg sporadically, would like a prescription.  Review of Systems  Denies chest pain, difficulty breathing. No fever chills or cough.  Past Medical History:  Diagnosis Date  . Benign localized hyperplasia of prostate with urinary obstruction   . Hypertension   . Nocturia   . Osteoarthritis   . Sarcoidosis    h/o, DX in the 90's to early 2000s (Dr.Simonds)    Past Surgical History:  Procedure Laterality Date  . FOOT SURGERY Left     remotely, at [facility], podiatry.  Marland Kitchen KNEE ARTHROSCOPY     Right x2  . KNEE ARTHROSCOPY     Left x1    Social History   Social History  . Marital status: Married    Spouse name: N/A  . Number of children: 2  . Years of education: N/A   Occupational History  . RETIRED 12-2016, works part time--[employer]  . Smoking status: Never Smoker  . Smokeless tobacco: Never Used  . Alcohol use No  . Drug use: No  . Sexual activity: Not on file   Other Topics Concern  . Not on file   Social History Narrative    Household-- pt, wife and daughter   2 adult children      Allergies as of 03/04/2017   No Known Allergies     Medication List       Accurate as of 03/04/17 11:59 PM. Always use your most recent med list.          amLODipine 10 MG tablet Commonly known as:  NORVASC Take 1 tablet (10 mg total) by mouth daily.   ibuprofen 800 MG tablet Commonly known as:  ADVIL,MOTRIN Take 1 tablet (800 mg total) by mouth  daily as needed for pain. Take with food.   loratadine 10 MG tablet Commonly known as:  CLARITIN Take 1 tablet (10 mg total) by mouth daily as needed for allergies.   losartan 100 MG tablet Commonly known as:  COZAAR Take 1 tablet (100 mg total) by mouth daily.   tamsulosin 0.4 MG Caps capsule Commonly known as:  FLOMAX Take 1 capsule by mouth daily.          Objective:   Physical Exam BP 128/80 (BP Location: Left Arm, Patient Position: Sitting, Cuff Size: Normal)   Pulse 76   Temp 98.4 F (36.9 C) (Oral)   Resp 14   Ht 5\' 11"  (1.803 m)   Wt 250 lb 4 oz (113.5 kg)   SpO2 98%   BMI 34.90 kg/m  General:   Well developed, well nourished . NAD.  HEENT:  Normocephalic . Face symmetric, atraumatic Lungs:  CTA B Normal respiratory effort, no intercostal retractions, no accessory muscle use. Heart: RRR,  no murmur.  No pretibial edema bilaterally  Skin:  Few scars at the neck and jaw consistent with previous folliculitis Neurologic:  alert & oriented X3.  Speech normal, gait appropriate for age and  unassisted Psych--  Cognition and judgment appear intact.  Cooperative with normal attention span and concentration.  Behavior appropriate. No anxious or depressed appearing.      Assessment & Plan:   Assessment   HTN DJD BPH last visit w/ urology 01-2016  Sarcoidosis, dx 1990s, Dr. Alva Garnet Thyromegaly? Eczema (L.E.) LLL PNM 06-2016   PLAN: HTN: Seems well-controlled on amlodipine and losartan. Check a BMP DJD: Takes ibuprofen sporadically, likes the 800 mg tablets, prescription provided, to take only once daily and sporadically. GI precautions discussed BPH: Symptoms greatly decreased with Flomax. Folliculitis:   folliculitis by history, he needs a letter stating  he can't shave frequently. Hyperglycemia? Check A1c Primary care: Again declined a pneumonia shot RTC 6 months

## 2017-03-05 DIAGNOSIS — L738 Other specified follicular disorders: Secondary | ICD-10-CM | POA: Insufficient documentation

## 2017-03-05 HISTORY — DX: Other specified follicular disorders: L73.8

## 2017-03-05 LAB — BASIC METABOLIC PANEL
BUN: 21 mg/dL (ref 6–23)
CALCIUM: 9.7 mg/dL (ref 8.4–10.5)
CO2: 28 mEq/L (ref 19–32)
Chloride: 106 mEq/L (ref 96–112)
Creatinine, Ser: 1.58 mg/dL — ABNORMAL HIGH (ref 0.40–1.50)
GFR: 56.85 mL/min — AB (ref >60.00)
Glucose, Bld: 112 mg/dL — ABNORMAL HIGH (ref 70–99)
POTASSIUM: 4.3 meq/L (ref 3.5–5.1)
SODIUM: 143 meq/L (ref 135–145)

## 2017-03-05 LAB — HEMOGLOBIN A1C: Hgb A1c MFr Bld: 5.4 % (ref 4.6–6.5)

## 2017-03-05 NOTE — Assessment & Plan Note (Addendum)
  HTN: Seems well-controlled on amlodipine and losartan. Check a BMP DJD: Takes ibuprofen sporadically, likes the 800 mg tablets, prescription provided, to take only once daily and sporadically. GI precautions discussed BPH: Symptoms greatly decreased with Flomax. Folliculitis:   folliculitis by history, he needs a letter stating  he can't shave frequently. Hyperglycemia? Check A1c Primary care: Again declined a pneumonia shot RTC 6 months

## 2017-03-10 NOTE — Addendum Note (Signed)
Addended byDamita Dunnings D on: 03/10/2017 02:04 PM   Modules accepted: Orders

## 2017-03-11 ENCOUNTER — Ambulatory Visit: Payer: 59 | Admitting: Internal Medicine

## 2017-03-24 ENCOUNTER — Other Ambulatory Visit: Payer: Self-pay | Admitting: Internal Medicine

## 2017-03-25 ENCOUNTER — Telehealth: Payer: Self-pay | Admitting: Internal Medicine

## 2017-03-25 MED ORDER — NYSTATIN-TRIAMCINOLONE 100000-0.1 UNIT/GM-% EX OINT: 1.0000 "application " | TOPICAL_OINTMENT | Freq: Two times a day (BID) | CUTANEOUS | 1 refills | Status: DC | PRN

## 2017-03-25 NOTE — Telephone Encounter (Signed)
Has a history of eczema, okay to use Mycolog sporadically, send 1 prescription and one refill

## 2017-03-25 NOTE — Telephone Encounter (Signed)
Caller name: Relationship to patient: Self Can be reached: [phone]  [facility]  [id number] [place], [place]. [phone] (Phone) [phone] (Fax)     Reason for call: Patient request a refill on nystatin-triamcinolone ointment (MYCOLOG. Patient states that this one is less expensive for him now. States he is not picking up the Hydrocortisone because it does not work.

## 2017-03-25 NOTE — Telephone Encounter (Signed)
Please advise 

## 2017-03-25 NOTE — Telephone Encounter (Signed)
Hydrocortisone d/c. [facility]

## 2017-04-28 ENCOUNTER — Telehealth: Payer: Self-pay | Admitting: Behavioral Health

## 2017-04-28 ENCOUNTER — Telehealth: Payer: Self-pay | Admitting: Internal Medicine

## 2017-04-28 NOTE — Telephone Encounter (Signed)
Patient voiced that about a week ago, after working out he noticed some pain in his upper back on the left side; since then he's been having some slight discomfort in his chest, right beneath the collar bone area on the left side. Currently, the patient denies chest pain, shortness of breath, numbness/tingling in the limbs, headaches, dizziness & lightheadedness. Patient declines ED visit at this time. He stated, "if I feel that I'm having chest pain I will definitely go, but it's not quite that bad". An appointment has been scheduled for 04/30/17 at 10:00 with Dr. Larose Kells. Advised patient that if there is a change in his condition or symptoms become more severe, please seek the nearest emergency department. He verbalized understanding & did not have any further concerns prior to call ending.

## 2017-04-28 NOTE — Telephone Encounter (Signed)
Pt called in inquiring about different health test. Pt says that he has been experiencing some SOB and chest discomfort. Pt says that his spouse had the same concern and ended up having to have a stint put in her artery. Pt would like to have different preventative test completed to prevent peripheral artery disease.   Tried transferring pt to team health for triaging due to SOB and chest discomfort concern. Pt declined. Asked RN in office to follow up with pt before scheduling appt with PCP

## 2017-04-30 ENCOUNTER — Encounter: Payer: Self-pay | Admitting: Internal Medicine

## 2017-04-30 ENCOUNTER — Ambulatory Visit (INDEPENDENT_AMBULATORY_CARE_PROVIDER_SITE_OTHER): Payer: Medicare Other | Admitting: Internal Medicine

## 2017-04-30 ENCOUNTER — Ambulatory Visit (HOSPITAL_BASED_OUTPATIENT_CLINIC_OR_DEPARTMENT_OTHER)
Admission: RE | Admit: 2017-04-30 | Discharge: 2017-04-30 | Disposition: A | Discharge status: Home / Self Care | Payer: Medicare Other | Source: Ambulatory Visit | Attending: Internal Medicine | Admitting: Internal Medicine

## 2017-04-30 VITALS — BP 126/78 | HR 74 | Temp 98.1°F | Resp 14 | Ht 71.0 in | Wt 252.0 lb

## 2017-04-30 DIAGNOSIS — R0609 Other forms of dyspnea: Secondary | ICD-10-CM | POA: Diagnosis not present

## 2017-04-30 DIAGNOSIS — R079 Chest pain, unspecified: Secondary | ICD-10-CM

## 2017-04-30 DIAGNOSIS — R9439 Abnormal result of other cardiovascular function study: Secondary | ICD-10-CM | POA: Diagnosis not present

## 2017-04-30 LAB — CBC WITH DIFFERENTIAL/PLATELET
BASOS PCT: 0.7 % (ref 0.0–3.0)
Basophils Absolute: 0 10*3/uL (ref 0.0–0.1)
EOS ABS: 0.2 10*3/uL (ref 0.0–0.7)
EOS PCT: 3.8 % (ref 0.0–5.0)
HEMATOCRIT: 42.3 % (ref 39.0–52.0)
HEMOGLOBIN: 13.7 g/dL (ref 13.0–17.0)
LYMPHS PCT: 32.7 % (ref 12.0–46.0)
Lymphs Abs: 2.1 10*3/uL (ref 0.7–4.0)
MCHC: 32.4 g/dL (ref 30.0–36.0)
MCV: 88 fl (ref 78.0–100.0)
Monocytes Absolute: 0.5 10*3/uL (ref 0.1–1.0)
Monocytes Relative: 7.8 % (ref 3.0–12.0)
NEUTROS ABS: 3.5 10*3/uL (ref 1.4–7.7)
Neutrophils Relative %: 55 % (ref 43.0–77.0)
PLATELETS: 149 10*3/uL — AB (ref 150.0–400.0)
RBC: 4.81 Mil/uL (ref 4.22–5.81)
RDW: 13.2 % (ref 11.5–15.5)
WBC: 6.4 10*3/uL (ref 4.0–10.5)

## 2017-04-30 LAB — BASIC METABOLIC PANEL
BUN: 20 mg/dL (ref 6–23)
CHLORIDE: 107 meq/L (ref 96–112)
CO2: 26 meq/L (ref 19–32)
CREATININE: 1.41 mg/dL (ref 0.40–1.50)
Calcium: 9.4 mg/dL (ref 8.4–10.5)
GFR: 64.8 mL/min (ref >60.00)
Glucose, Bld: 127 mg/dL — ABNORMAL HIGH (ref 70–99)
POTASSIUM: 4.3 meq/L (ref 3.5–5.1)
Sodium: 141 mEq/L (ref 135–145)

## 2017-04-30 NOTE — Telephone Encounter (Signed)
noted 

## 2017-04-30 NOTE — Progress Notes (Signed)
Subjective:    Patient ID: Levi Jones, male    DOB: Jan 22, 1951, 66 y.o.   MRN: [id number]  DOS:  04/30/2017 Type of visit - description :  Acute visit Interval history:  Approximately 10 days history of pain located at the left back and left upper chest. Pain is steady with on and off exacerbation. There are no particular triggers for the exacerbation, sometimes  happens when he drives. Denies any injury, rash. Reports occasionally shortness of breath but that is not a new issue.  Review of Systems No neck pain per se No fever chills No lower extremity edema or palpitations No cough No recent airplane trip, no prolonged car trips. Occasional heartburn  Past Medical History:  Diagnosis Date  . Benign localized hyperplasia of prostate with urinary obstruction   . Hypertension   . Nocturia   . Osteoarthritis   . Sarcoidosis    h/o, DX in the 90's to early 2000s (Dr.Simonds)    Past Surgical History:  Procedure Laterality Date  . FOOT SURGERY Left     remotely, at [facility], podiatry.  Marland Kitchen KNEE ARTHROSCOPY     Right x2  . KNEE ARTHROSCOPY     Left x1    Social History   Social History  . Marital status: Married    Spouse name: N/A  . Number of children: 2  . Years of education: N/A   Occupational History  . RETIRED 12-2016, works part time--[employer] [place]  . Smoking status: Never Smoker  . Smokeless tobacco: Never Used  . Alcohol use No  . Drug use: No  . Sexual activity: Not on file   Other Topics Concern  . Not on file   Social History Narrative    Household-- pt, wife and daughter   2 adult children      Allergies as of 04/30/2017   No Known Allergies     Medication List       Accurate as of 04/30/17  1:50 PM. Always use your most recent med list.          amLODipine 10 MG tablet Commonly known as:  NORVASC Take 1 tablet (10 mg total) by mouth daily.   aspirin EC 81 MG tablet Take 81 mg by  mouth daily.   ibuprofen 800 MG tablet Commonly known as:  ADVIL,MOTRIN Take 1 tablet (800 mg total) by mouth daily as needed for pain. Take with food.   loratadine 10 MG tablet Commonly known as:  CLARITIN Take 1 tablet (10 mg total) by mouth daily as needed for allergies.   losartan 100 MG tablet Commonly known as:  COZAAR Take 1 tablet (100 mg total) by mouth daily.   nystatin-triamcinolone ointment Commonly known as:  MYCOLOG Apply 1 application topically 2 (two) times daily as needed. Use sparingly   tamsulosin 0.4 MG Caps capsule Commonly known as:  FLOMAX Take 1 capsule by mouth daily.          Objective:   Physical Exam  Musculoskeletal:       Arms:  BP 126/78 (BP Location: Left Arm, Patient Position: Sitting, Cuff Size: Normal)   Pulse 74   Temp 98.1 F (36.7 C) (Oral)   Resp 14   Ht 5\' 11"  (1.803 m)   Wt 252 lb (114.3 kg)   SpO2 98%   BMI 35.15 kg/m  General:   Well developed, well nourished . NAD.  HEENT:  Normocephalic . Face  symmetric, atraumatic Lungs:  CTA B Normal respiratory effort, no intercostal retractions, no accessory muscle use. Heart: RRR,  no murmur.  no pretibial edema bilaterally . Calves symmetric and soft. No TTP Abdomen:  Not distended, soft, non-tender. No rebound or rigidity.  MSK: Cervical spine palpation normal, neck range of motion normal. Anterior chest will not TTP Passive rotation of the shoulders is normal except that he triggers pain located at the left shoulder but not at the area where he has pain Skin: Not pale. Not jaundice Neurologic:  alert & oriented X3.  Speech normal, gait appropriate for age and unassisted Psych--  Cognition and judgment appear intact.  Cooperative with normal attention span and concentration.  Behavior appropriate. No anxious or depressed appearing.    Assessment & Plan:   Assessment   HTN DJD BPH last visit w/ urology 01-2016  Sarcoidosis, dx 1990s, Dr.  Alva Garnet Thyromegaly? Eczema (L.E.) LLL PNM 06-2016   PLAN: Left upper chest pain, left upper back pain: 66 year old male w/ h/o HTN presents with pain as described above. Pain is atypical for heart disease, also atypical for MSK. Very low suspicion for pulmonary emboli (normal pulse,no hypoxia). He is very concerned about his heart. EKG today normal sinus rhythm, no acute changes. No different from previous. Plan: BMP, CBC, chest x-ray. Stress test. If workup negative, pain  MSK related?. Will treat accordingly. ER if symptoms increase.  RTC a routine visit 08-2017

## 2017-04-30 NOTE — Patient Instructions (Addendum)
GO TO THE LAB : Get the blood work     GO TO THE FRONT DESK Schedule your next appointment for a  checkup by November 2018   STOP BY THE FIRST FLOOR:  get the XR   We will schedule a stress test to check your heart  Call or go to the ER if symptoms severe, if the pain moves to the anterior chest, if you have difficulty breathing or leg swelling.  Start on aspirin 81 mg one tablet daily

## 2017-04-30 NOTE — Progress Notes (Signed)
Pre visit review using our clinic review tool, if applicable. No additional management support is needed unless otherwise documented below in the visit note. 

## 2017-04-30 NOTE — Assessment & Plan Note (Signed)
Left upper chest pain, left upper back pain: 66 year old male w/ h/o HTN presents with pain as described above. Pain is atypical for heart disease, also atypical for MSK. Very low suspicion for pulmonary emboli (normal pulse,no hypoxia). He is very concerned about his heart. EKG today normal sinus rhythm, no acute changes. No different from previous. Plan: BMP, CBC, chest x-ray. Stress test. If workup negative, pain  MSK related?. Will treat accordingly. ER if symptoms increase.  RTC a routine visit 08-2017

## 2017-05-04 ENCOUNTER — Telehealth (HOSPITAL_COMMUNITY): Payer: Self-pay | Admitting: *Deleted

## 2017-05-04 NOTE — Telephone Encounter (Signed)
Patient given detailed instructions per Myocardial Perfusion Study Information Sheet for the test on 05/07/17 Patient notified to arrive 15 minutes early and that it is imperative to arrive on time for appointment to keep from having the test rescheduled.  If you need to cancel or reschedule your appointment, please call the office within 24 hours of your appointment. . Patient verbalized understanding. Kirstie Peri

## 2017-05-07 ENCOUNTER — Ambulatory Visit (HOSPITAL_COMMUNITY): Payer: Medicare Other | Attending: Cardiology

## 2017-05-07 VITALS — Ht 71.0 in | Wt 252.0 lb

## 2017-05-07 DIAGNOSIS — I10 Essential (primary) hypertension: Secondary | ICD-10-CM | POA: Diagnosis not present

## 2017-05-07 DIAGNOSIS — R9439 Abnormal result of other cardiovascular function study: Secondary | ICD-10-CM | POA: Diagnosis not present

## 2017-05-07 DIAGNOSIS — I2510 Atherosclerotic heart disease of native coronary artery without angina pectoris: Secondary | ICD-10-CM | POA: Diagnosis not present

## 2017-05-07 DIAGNOSIS — R079 Chest pain, unspecified: Secondary | ICD-10-CM

## 2017-05-07 DIAGNOSIS — R0602 Shortness of breath: Secondary | ICD-10-CM | POA: Diagnosis not present

## 2017-05-07 LAB — MYOCARDIAL PERFUSION IMAGING
CHL CUP NUCLEAR SDS: 0
CHL CUP NUCLEAR SSS: 4
LHR: 0.31
LV dias vol: 141 mL (ref 62–150)
LV sys vol: 83 mL
NUC STRESS TID: 1.01
Peak HR: 97 {beats}/min
Rest HR: 69 {beats}/min
SRS: 4

## 2017-05-07 MED ORDER — TECHNETIUM TC 99M TETROFOSMIN IV KIT
10.1000 | PACK | Freq: Once | INTRAVENOUS | Status: AC | PRN
  Administered 2017-05-07: 10.1 via INTRAVENOUS
  Filled 2017-05-07: qty 11

## 2017-05-07 MED ORDER — REGADENOSON 0.4 MG/5ML IV SOLN
0.4000 mg | Freq: Once | INTRAVENOUS | Status: AC
  Administered 2017-05-07: 0.4 mg via INTRAVENOUS

## 2017-05-07 MED ORDER — TECHNETIUM TC 99M TETROFOSMIN IV KIT
32.9000 | PACK | Freq: Once | INTRAVENOUS | Status: AC | PRN
Start: 2017-05-07 — End: 2017-05-07
  Administered 2017-05-07: 32.9 via INTRAVENOUS
  Filled 2017-05-07: qty 33

## 2017-05-08 NOTE — Addendum Note (Signed)
Addended byDamita Dunnings D on: 05/08/2017 11:07 AM   Modules accepted: Orders

## 2017-06-03 ENCOUNTER — Telehealth: Payer: Self-pay | Admitting: Internal Medicine

## 2017-06-03 NOTE — Telephone Encounter (Signed)
Caller name:Dillinger Henrene Pastor Relationship to patient: Can be reached:[phone] Pharmacy:  Reason for call:Requesting to know his blood type

## 2017-06-03 NOTE — Telephone Encounter (Signed)
Okay to place orders 

## 2017-06-03 NOTE — Telephone Encounter (Signed)
Spoke w/ Pt, informed that I didn't see in chart where we haven't check blood type before, informed that blood typing isn't ran in everyday routine bloodwork. Informed we could place orders, however would recommend to discuss w/ insurance to see if they pay or he could go by [employer] to donate blood and they will type blood for free. Pt verbalized understanding and will call back if interested in orders being placed.

## 2017-06-03 NOTE — Telephone Encounter (Signed)
Okay with me, make patient aware that his insurance may not pay for it as it is not medically necessary

## 2017-06-05 ENCOUNTER — Ambulatory Visit: Payer: Medicare Other | Admitting: Cardiology

## 2017-07-13 DIAGNOSIS — R9439 Abnormal result of other cardiovascular function study: Secondary | ICD-10-CM | POA: Insufficient documentation

## 2017-07-13 NOTE — Progress Notes (Signed)
Cardiology Office Note    Date:  07/14/2017   ID:  Levi Jones, DOB 08/22/1951, MRN [id number]  PCP:  Colon Branch, MD  Cardiologist: Sinclair Grooms, MD   Chief Complaint  Patient presents with  . Follow-up    Abnormal nuclear stress test.  . Advice Only    Abnormal EKG    History of Present Illness:  Levi Jones is a 66 y.o. male is being referred by Dr Belinda Fisher for evaluation of a "abnormal stress test".  Patient had cardiac evaluation performed by Dr. Larose Kells earlier this summer. It was a pharmacologic myocardial perfusion study and was abnormal. It was graded as intermediate risk. EF was stated to be 41% and there was a fixed inferolateral perfusion abnormality. The referral was made because of the stress results.  Risk factors for CAD include age, hypertension, and hyperlipidemia. He is nondiabetic, does not smoke, but does have family history of hypertension and CHF.  He is a [place] and has no limitations with reference to physical activity and performing his job responsibilities.  Sarcoid diagnosed by excisional biopsy in 2002 (he was being cared for by Dr. Merton Border at that time)  Past Medical History:  Diagnosis Date  . Abnormal LFTs 07/01/2016  . ALLERGIC RHINITIS CAUSE UNSPECIFIED 03/05/2010   Qualifier: Diagnosis of  By: Stann Mainland CMA, [place]    . Anemia 07/02/2016  . Benign localized hyperplasia of prostate with urinary obstruction   . CAP (community acquired pneumonia) 07/01/2016  . 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 Hypertension   . Nocturia   . Osteoarthritis   . Sarcoidosis    h/o, DX in the 90's to early 2000s (Dr.Simonds)  . Sciatica 09/27/2014  . Thyromegaly 03/28/2015    Past Surgical History:  Procedure Laterality Date  . FOOT SURGERY Left     remotely, at [facility],  podiatry.  Marland Kitchen KNEE ARTHROSCOPY     Right x2  . KNEE ARTHROSCOPY     Left x1    Current Medications: Outpatient Medications Prior to Visit  Medication Sig Dispense Refill  . amLODipine (NORVASC) 10 MG tablet Take 1 tablet (10 mg total) by mouth daily. 90 tablet 1  . ibuprofen (ADVIL,MOTRIN) 800 MG tablet Take 1 tablet (800 mg total) by mouth daily as needed for pain. Take with food. 90 tablet 0  . loratadine (CLARITIN) 10 MG tablet Take 1 tablet (10 mg total) by mouth daily as needed for allergies. 30 tablet 6  . losartan (COZAAR) 100 MG tablet Take 1 tablet (100 mg total) by mouth daily. 90 tablet 1  . nystatin-triamcinolone ointment (MYCOLOG) Apply 1 application topically 2 (two) times daily as needed. Use sparingly 60 g 1  . tamsulosin (FLOMAX) 0.4 MG CAPS capsule Take 1 capsule by mouth daily.    Marland Kitchen aspirin EC 81 MG tablet Take 81 mg by mouth daily.     No facility-administered medications prior to visit.      Allergies:   Patient has no known allergies.   Social History   Social History  . Marital status: Married    Spouse name: N/A  . Number of children: 2  . Years of education: N/A   Occupational History  . RETIRED 12-2016, works part time--[employer] [place]  .  Smoking status: Never Smoker  . Smokeless tobacco: Never Used  . Alcohol use No  . Drug use: No  . Sexual activity: Not Asked   Other Topics Concern  . None   Social History Narrative    Household-- pt, wife and daughter   2 adult children     Family History:  The patient's family history includes Coronary artery disease in his mother; Heart failure in his mother; Hypertension in his mother; Lung cancer in his father.   ROS:   Please see the history of present illness.    No complaints. Jovial personality. Able to work everyday without limitations or complaints. Does have some shortness of breath that he feels may be unusual.  All other systems reviewed and are  negative.   PHYSICAL EXAM:   VS:  BP 116/78 (BP Location: Left Arm)   Pulse 75   Ht 5\' 11"  (1.803 m)   Wt 243 lb 12.8 oz (110.6 kg)   BMI 34.00 kg/m    GEN: Well nourished, well developed, in no acute distress  HEENT: normal  Neck: no JVD, carotid bruits, or masses Cardiac: RRR; no murmurs, rubs, or gallops,no edema  Respiratory:  clear to auscultation bilaterally, normal work of breathing GI: soft, nontender, nondistended, + BS MS: no deformity or atrophy  Skin: warm and dry, no rash Neuro:  Alert and Oriented x 3, Strength and sensation are intact Psych: euthymic mood, full affect  Wt Readings from Last 3 Encounters:  07/14/17 243 lb 12.8 oz (110.6 kg)  05/07/17 252 lb (114.3 kg)  04/30/17 252 lb (114.3 kg)      Studies/Labs Reviewed:   EKG:  EKG  EKG 04/30/17 interventricular conduction delay/incomplete left bundle-branch block with poor R-wave progression.  Recent Labs: 07/22/2016: ALT 26 04/30/2017: BUN 20; Creatinine, Ser 1.41; Hemoglobin 13.7; Platelets 149.0; Potassium 4.3; Sodium 141   Lipid Panel    Component Value Date/Time   CHOL 209 (H) 09/11/2016 1106   TRIG 113.0 09/11/2016 1106   TRIG 123 01/17/2008 0000   HDL 69.20 09/11/2016 1106   CHOLHDL 3 09/11/2016 1106   VLDL 22.6 09/11/2016 1106   LDLCALC 117 (H) 09/11/2016 1106   LDLDIRECT 118.7 07/30/2011 1101    Additional studies/ records that were reviewed today include:  Stress Myoview 05/07/17: Study Highlights   Nuclear stress EF: 41%.  Inferior/inferolateral defect consistent with scar and possible soft tissue attenuation No ischemia  Intermediate risk study        ASSESSMENT:    1. Abnormal nuclear stress test   2. Sarcoidosis   3. Essential hypertension   4. Abnormal EKG      PLAN:  In order of problems listed above:  1. The study was described as intermediate risk due to decreasing EF at 41% and a fixed perfusion abnormality involving the inferolateral wall. Further investigation  is necessary to rule out silent ischemia/asymptomatic coronary disease. A 2-D Doppler echocardiogram will be done to reassess LV function is a severe more accurate study. I also recommend that he undergo coronary CT angiography rather than invasive angiography. There is a good chance that the stress test is false positive. 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 2. Not well defined in the current chart. 3. Blood pressure is adequately controlled. Echocardiography will help exclude the possibility of structural abnormality. 4. Interventricular conduction delay with an incomplete left bundle appearance not diagnostic of left anterior hemiblock.  May be purely an electrical phenomena not associated with ischemic heart disease. Further investigation of coronary circulation will help exclude ischemic injury.  The patient has no real symptoms but hasn't abnormal stress nuclear study. I believe an echocardiogram and coronary CTA and JL are appropriate to determine if significant underlying coronary disease exists. If he has heavy calcification making angiography unwise or is found to have significant obstruction, he will obviously need to have invasive angiography performed.  Medication Adjustments/Labs and Tests Ordered: Current medicines are reviewed at length with the patient today.  Concerns regarding medicines are outlined above.  Medication changes, Labs and Tests ordered today are listed in the Patient Instructions below. Patient Instructions  Medication Instructions:  None  Labwork: None  Testing/Procedures: Your physician has requested that you have an echocardiogram. Echocardiography is a painless test that uses sound waves to create images of your heart. It provides your doctor with information about the size and shape of your heart and how well your heart's chambers and valves are working. This procedure takes  approximately one hour. There are no restrictions for this procedure.  Your physician would like for you to have Coronary CTA.   Follow-Up: Your physician recommends that you schedule a follow-up appointment in: 1 month with Dr. Tamala Julian.    Any Other Special Instructions Will Be Listed Below (If Applicable).     If you need a refill on your cardiac medications before your next appointment, please call your pharmacy.      Signed, Sinclair Grooms, MD  07/14/2017 1:17 PM    [facility] [address], [place], [place]  [address] Phone: [phone]; Fax: [phone]

## 2017-07-14 ENCOUNTER — Ambulatory Visit (INDEPENDENT_AMBULATORY_CARE_PROVIDER_SITE_OTHER): Payer: Medicare Other | Admitting: Interventional Cardiology

## 2017-07-14 ENCOUNTER — Encounter: Payer: Self-pay | Admitting: Interventional Cardiology

## 2017-07-14 ENCOUNTER — Encounter (INDEPENDENT_AMBULATORY_CARE_PROVIDER_SITE_OTHER): Payer: Self-pay

## 2017-07-14 VITALS — BP 116/78 | HR 75 | Ht 71.0 in | Wt 243.8 lb

## 2017-07-14 DIAGNOSIS — D869 Sarcoidosis, unspecified: Secondary | ICD-10-CM

## 2017-07-14 DIAGNOSIS — R9431 Abnormal electrocardiogram [ECG] [EKG]: Secondary | ICD-10-CM

## 2017-07-14 DIAGNOSIS — R9439 Abnormal result of other cardiovascular function study: Secondary | ICD-10-CM

## 2017-07-14 DIAGNOSIS — I10 Essential (primary) hypertension: Secondary | ICD-10-CM | POA: Diagnosis not present

## 2017-07-14 NOTE — Patient Instructions (Addendum)
Medication Instructions:  None  Labwork: None  Testing/Procedures: Your physician has requested that you have an echocardiogram. Echocardiography is a painless test that uses sound waves to create images of your heart. It provides your doctor with information about the size and shape of your heart and how well your heart's chambers and valves are working. This procedure takes approximately one hour. There are no restrictions for this procedure.  Your physician would like for you to have Coronary CTA.   Follow-Up: Your physician recommends that you schedule a follow-up appointment in: 1 month with Dr. Tamala Julian.    Any Other Special Instructions Will Be Listed Below (If Applicable).     If you need a refill on your cardiac medications before your next appointment, please call your pharmacy.

## 2017-07-15 ENCOUNTER — Other Ambulatory Visit: Payer: Self-pay

## 2017-07-15 ENCOUNTER — Ambulatory Visit (HOSPITAL_COMMUNITY): Payer: Medicare Other | Attending: Cardiovascular Disease

## 2017-07-15 ENCOUNTER — Other Ambulatory Visit (HOSPITAL_COMMUNITY): Payer: Medicare Other

## 2017-07-15 DIAGNOSIS — R9439 Abnormal result of other cardiovascular function study: Secondary | ICD-10-CM | POA: Diagnosis present

## 2017-07-15 DIAGNOSIS — D869 Sarcoidosis, unspecified: Secondary | ICD-10-CM | POA: Insufficient documentation

## 2017-07-15 DIAGNOSIS — E785 Hyperlipidemia, unspecified: Secondary | ICD-10-CM | POA: Insufficient documentation

## 2017-07-15 DIAGNOSIS — I10 Essential (primary) hypertension: Secondary | ICD-10-CM | POA: Insufficient documentation

## 2017-07-15 LAB — ECHOCARDIOGRAM COMPLETE
Ao-asc: 34 cm
CHL CUP DOP CALC LVOT VTI: 22.7 cm
E/e' ratio: 6.78
EWDT: 215 ms
FS: 30 % (ref 28–44)
IV/PV OW: 0.9
LA diam index: 2.05 cm/m2
LA vol index: 32 mL/m2
LA vol: 73.2 mL
LASIZE: 47 mm
LAVOLA4C: 64.3 mL
LEFT ATRIUM END SYS DIAM: 47 mm
LV E/e'average: 6.78
LV TDI E'LATERAL: 10.3
LV TDI E'MEDIAL: 6.74
LVEEMED: 6.78
LVELAT: 10.3 cm/s
LVOT area: 3.46 cm2
LVOT diameter: 21 mm
LVOT peak vel: 102 cm/s
LVOTSV: 79 mL
Lateral S' vel: 8.81 cm/s
MV Dec: 215
MV pk A vel: 87.4 m/s
MV pk E vel: 69.8 m/s
PW: 10.3 mm — AB (ref 0.6–1.1)
RV TAPSE: 21.4 mm
Reg peak vel: 246 cm/s
TRMAXVEL: 246 cm/s

## 2017-07-20 ENCOUNTER — Encounter: Payer: Self-pay | Admitting: Interventional Cardiology

## 2017-07-21 ENCOUNTER — Other Ambulatory Visit: Payer: Medicare Other

## 2017-07-27 ENCOUNTER — Other Ambulatory Visit: Payer: Self-pay | Admitting: *Deleted

## 2017-07-27 ENCOUNTER — Ambulatory Visit (HOSPITAL_COMMUNITY): Payer: Medicare Other

## 2017-07-27 ENCOUNTER — Ambulatory Visit (HOSPITAL_COMMUNITY)
Admission: RE | Admit: 2017-07-27 | Discharge: 2017-07-27 | Disposition: A | Discharge status: Home / Self Care | Payer: Medicare Other | Source: Ambulatory Visit | Attending: Interventional Cardiology | Admitting: Interventional Cardiology

## 2017-07-27 ENCOUNTER — Encounter (HOSPITAL_COMMUNITY): Payer: Self-pay

## 2017-07-27 DIAGNOSIS — I2510 Atherosclerotic heart disease of native coronary artery without angina pectoris: Secondary | ICD-10-CM | POA: Diagnosis not present

## 2017-07-27 DIAGNOSIS — I281 Aneurysm of pulmonary artery: Secondary | ICD-10-CM | POA: Insufficient documentation

## 2017-07-27 DIAGNOSIS — I10 Essential (primary) hypertension: Secondary | ICD-10-CM

## 2017-07-27 DIAGNOSIS — R918 Other nonspecific abnormal finding of lung field: Secondary | ICD-10-CM | POA: Diagnosis not present

## 2017-07-27 DIAGNOSIS — R9439 Abnormal result of other cardiovascular function study: Secondary | ICD-10-CM

## 2017-07-27 DIAGNOSIS — J984 Other disorders of lung: Secondary | ICD-10-CM | POA: Insufficient documentation

## 2017-07-27 MED ORDER — NITROGLYCERIN 0.4 MG SL SUBL
SUBLINGUAL_TABLET | SUBLINGUAL | Status: AC
  Filled 2017-07-27: qty 2

## 2017-07-27 MED ORDER — IOPAMIDOL (ISOVUE-370) INJECTION 76%
INTRAVENOUS | Status: AC
  Administered 2017-07-27: 80 mL
  Filled 2017-07-27: qty 100

## 2017-07-27 MED ORDER — METOPROLOL TARTRATE 5 MG/5ML IV SOLN
INTRAVENOUS | Status: AC
  Filled 2017-07-27: qty 10

## 2017-07-27 MED ORDER — NITROGLYCERIN 0.4 MG SL SUBL
0.8000 mg | SUBLINGUAL_TABLET | Freq: Once | SUBLINGUAL | Status: AC
  Administered 2017-07-27: 0.8 mg via SUBLINGUAL

## 2017-07-27 MED ORDER — METOPROLOL TARTRATE 5 MG/5ML IV SOLN
5.0000 mg | Freq: Once | INTRAVENOUS | Status: AC
  Administered 2017-07-27: 5 mg via INTRAVENOUS

## 2017-08-04 ENCOUNTER — Telehealth: Payer: Self-pay

## 2017-08-04 ENCOUNTER — Telehealth: Payer: Self-pay | Admitting: Interventional Cardiology

## 2017-08-04 DIAGNOSIS — E785 Hyperlipidemia, unspecified: Secondary | ICD-10-CM

## 2017-08-04 NOTE — Telephone Encounter (Signed)
Called pt to schedule CPE after Sep 11, 2017 per provider reques in prior note. Pt states he will call back later to schedule that appt.

## 2017-08-04 NOTE — Telephone Encounter (Signed)
Pt called back, scheduled appointment for 09/23/17

## 2017-08-04 NOTE — Telephone Encounter (Signed)
Can you reach out to Pt and schedule him for CPE after  September 11, 2017? Thank you.

## 2017-08-04 NOTE — Telephone Encounter (Signed)
Left message to call back  

## 2017-08-04 NOTE — Telephone Encounter (Signed)
New Message ° ° pt verbalized that he is returning call for rn  °

## 2017-08-04 NOTE — Telephone Encounter (Signed)
He is due for a physical in November, please be sure he has something schedule.  Received: 2 days ago  [place], MD  Damita Dunnings, [place]

## 2017-08-04 NOTE — Telephone Encounter (Signed)
Spoke with pt and went over Coronary CT results.  Pt verbalized understanding.  Spoke with pt about keeping LDL <70.  Pt's last LDL in 08/2016 was 117.  Educated pt on diet, exercise and statin medications.  Pt wanted to know if Dr. Tamala Julian thought he would be ok to just try diet and exercise or does he feel he needs to be on a statin?  Advised pt we may need up to date labs before making a decision but I would send a message to Dr. Tamala Julian for review.  Pt appreciative for call.

## 2017-08-05 ENCOUNTER — Encounter: Payer: Self-pay | Admitting: Podiatry

## 2017-08-05 ENCOUNTER — Ambulatory Visit (INDEPENDENT_AMBULATORY_CARE_PROVIDER_SITE_OTHER): Payer: Medicare Other | Admitting: Podiatry

## 2017-08-05 DIAGNOSIS — D2372 Other benign neoplasm of skin of left lower limb, including hip: Secondary | ICD-10-CM | POA: Diagnosis not present

## 2017-08-05 DIAGNOSIS — L989 Disorder of the skin and subcutaneous tissue, unspecified: Secondary | ICD-10-CM

## 2017-08-05 NOTE — Patient Instructions (Signed)
Monitor for any signs/symptoms of infection. Call the office immediately if any occur or go directly to the emergency room. Call with any questions/concerns.  

## 2017-08-05 NOTE — Progress Notes (Signed)
   Subjective:    Patient ID: Levi Jones, male    DOB: 03-Apr-1951, 66 y.o.   MRN: [id number]  HPI  66 year old male presents to the office today for concerns of painful callus to the ball of his left foot point submetatarsal one which is been ongoing for several years. He states he appears see her bunion surgery and he had hardware removal several years ago. He's had a callus to this area for quite some time. He denies stepping on any foreign objects and denies any redness or drainage or any swelling. He's had no recent treatment. Decreases of the area cut out it comes back. He has no other concerns today.  He does have a history of sarcoidosis according to this chart but he states that he's had no issues from this and several years and he is not sure if he even really has it.   Review of Systems  All other systems reviewed and are negative.      Objective:   Physical Exam  General: AAO x3, NAD  Dermatological: Small punctate annular hyperkeratotic lesion left foot submetatarsal 1 under the sesamoids. Upon debridement there is no underlying ulceration, drainage or any signs of infection and there is no evidence of foreign body. There is dry skin bilaterally. There are no other open lesions or pre-ulcerative lesions at the L5 today.  Vascular: Dorsalis Pedis artery and Posterior Tibial artery pedal pulses are 2/4 bilateral with immedate capillary fill time.  There is no pain with calf compression, swelling, warmth, erythema.   Neruologic: Grossly intact via light touch bilateral. Protective threshold with Semmes Wienstein monofilament intact to all pedal sites bilateral.   Musculoskeletal: On the right foot moderate HAV is present. There is no pitting the right foot otherwise. On the left foot there is decreased range of motion of first MTPJ mostly plantarflexion. Hammertoes are present. Tenderness the hyperkeratotic lesion submetatarsal one left. No other areas of tenderness  identified.  Gait: Unassisted, Nonantalgic.      Assessment & Plan:  66 year old male porokeratosis left submetatarsal 1 -Treatment options discussed including all alternatives, risks, and complications -Etiology of symptoms were discussed -Lesion tibia sharply debrided 1 without any complications or bleeding. Area was cleaned with alcohol and a pad was placed followed by salicylic acid and a bandage. Post procedure instructions were discussed. Monitor for infection. Offloading pads were dispensed.  Celesta Gentile, DPM

## 2017-08-05 NOTE — Telephone Encounter (Signed)
meds will be more effective at preventing future problems.

## 2017-08-06 MED ORDER — ATORVASTATIN CALCIUM 10 MG PO TABS: 10.0000 mg | ORAL_TABLET | Freq: Every day | ORAL | 3 refills | Status: DC

## 2017-08-06 NOTE — Telephone Encounter (Signed)
Spoke with Dr. Tamala Julian and he would like for pt to try Atorvastatin 10mg  QD, lipid/liver in 8 weeks.  Spoke with pt and went over recommendations.  Pt in agreement with plan.  Labs will be 10/01/17.  Reviewed possible side effects with pt.  Pt verbalized understanding and was appreciative for call.

## 2017-08-26 ENCOUNTER — Ambulatory Visit (INDEPENDENT_AMBULATORY_CARE_PROVIDER_SITE_OTHER): Payer: Medicare Other | Admitting: Interventional Cardiology

## 2017-08-26 ENCOUNTER — Encounter: Payer: Self-pay | Admitting: Interventional Cardiology

## 2017-08-26 VITALS — BP 100/76 | HR 79 | Ht 71.0 in | Wt 241.6 lb

## 2017-08-26 DIAGNOSIS — I2510 Atherosclerotic heart disease of native coronary artery without angina pectoris: Secondary | ICD-10-CM

## 2017-08-26 DIAGNOSIS — E785 Hyperlipidemia, unspecified: Secondary | ICD-10-CM | POA: Diagnosis not present

## 2017-08-26 DIAGNOSIS — I10 Essential (primary) hypertension: Secondary | ICD-10-CM | POA: Diagnosis not present

## 2017-08-26 NOTE — Progress Notes (Signed)
Cardiology Office Note    Date:  08/26/2017   ID:  Levi Jones, DOB June 01, 1951, MRN [id number]  PCP:  Colon Branch, MD  Cardiologist: Sinclair Grooms, MD   Chief Complaint  Patient presents with  . 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  Cardiac evaluation is demonstrated normal LV function by echo.  This refutes the LVEF of 41% by nuclear scintigraphy.  Coronary CTA demonstrated mild nonobstructive plaque.  He is not symptomatic.  He has mild risk factors which include hypertension and mild to moderate hyperlipidemia.  After discussion we have embarked upon primary prevention which will include aggressive blood pressure lowering and lipid-lowering.  Aspirin is also been recommended.  He has not started statin therapy.  He has not started aspirin as instructed.    Past Medical History:  Diagnosis Date  . Abnormal LFTs 07/01/2016  . ALLERGIC RHINITIS CAUSE UNSPECIFIED 03/05/2010   Qualifier: Diagnosis of  By: Stann Mainland CMA, [place]    . Anemia 07/02/2016  . Benign localized hyperplasia of prostate with urinary obstruction   . CAP (community acquired pneumonia) 07/01/2016  . 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 Hypertension   . Nocturia   . Osteoarthritis   . Sarcoidosis    h/o, DX in the 90's to early 2000s (Dr.Simonds)  . Sciatica 09/27/2014  . Thyromegaly 03/28/2015    Past Surgical History:  Procedure Laterality Date  . FOOT SURGERY Left     remotely, at [facility], podiatry.  Marland Kitchen KNEE ARTHROSCOPY     Right x2  . KNEE ARTHROSCOPY     Left x1    Current Medications: Outpatient Medications Prior to Visit  Medication Sig Dispense Refill  . amLODipine  (NORVASC) 10 MG tablet Take 1 tablet (10 mg total) by mouth daily. 90 tablet 1  . aspirin EC 81 MG tablet Take 81 mg by mouth daily.    Marland Kitchen atorvastatin (LIPITOR) 10 MG tablet Take 1 tablet (10 mg total) by mouth daily. 90 tablet 3  . ibuprofen (ADVIL,MOTRIN) 800 MG tablet Take 1 tablet (800 mg total) by mouth daily as needed for pain. Take with food. 90 tablet 0  . loratadine (CLARITIN) 10 MG tablet Take 1 tablet (10 mg total) by mouth daily as needed for allergies. 30 tablet 6  . losartan (COZAAR) 100 MG tablet Take 1 tablet (100 mg total) by mouth daily. 90 tablet 1  . NON FORMULARY [facility]  Onychomycosis Nail Lacquer -  Fluconazole 2%, Terbinafine 1% DMSO Apply to affected nail once daily Qty. 120 gm 3 refills    . nystatin-triamcinolone ointment (MYCOLOG) Apply 1 application topically 2 (two) times daily as needed. Use sparingly 60 g 1  . tamsulosin (FLOMAX) 0.4 MG CAPS capsule Take 1 capsule by mouth daily.     No facility-administered medications prior to visit.      Allergies:   Patient has no known allergies.   Social History   Social History  . Marital status: Married    Spouse name: N/A  . Number of children: 2  . Years of education: N/A   Occupational History  . RETIRED 12-2016, works part time--[employer]  Social History Main Topics  . Smoking status: Never Smoker  . Smokeless tobacco: Never Used  . Alcohol use No  . Drug use: No  . Sexual activity: Not Asked   Other Topics Concern  . None   Social History Narrative    Household-- pt, wife and daughter   2 adult children     Family History:  The patient's family history includes Coronary artery disease in his mother; Heart failure in his mother; Hypertension in his mother; Lung cancer in his father.   ROS:   Please see the history of present illness.    No complaints All other systems reviewed and are negative.   PHYSICAL EXAM:   VS:  BP 100/76 (BP Location: Left Arm)    Pulse 79   Ht 5\' 11"  (1.803 m)   Wt 241 lb 9.6 oz (109.6 kg)   BMI 33.70 kg/m    GEN: Well nourished, well developed, in no acute distress  HEENT: normal  Neck: no JVD, carotid bruits, or masses Cardiac: RRR; no murmurs, rubs, or gallops,no edema  Respiratory:  clear to auscultation bilaterally, normal work of breathing GI: soft, nontender, nondistended, + BS MS: no deformity or atrophy  Skin: warm and dry, no rash Neuro:  Alert and Oriented x 3, Strength and sensation are intact Psych: euthymic mood, full affect  Wt Readings from Last 3 Encounters:  08/26/17 241 lb 9.6 oz (109.6 kg)  07/14/17 243 lb 12.8 oz (110.6 kg)  05/07/17 252 lb (114.3 kg)      Studies/Labs Reviewed:   EKG:  EKG not repeated  Recent Labs: 04/30/2017: BUN 20; Creatinine, Ser 1.41; Hemoglobin 13.7; Platelets 149.0; Potassium 4.3; Sodium 141   Lipid Panel    Component Value Date/Time   CHOL 209 (H) 09/11/2016 1106   TRIG 113.0 09/11/2016 1106   TRIG 123 01/17/2008 0000   HDL 69.20 09/11/2016 1106   CHOLHDL 3 09/11/2016 1106   VLDL 22.6 09/11/2016 1106   LDLCALC 117 (H) 09/11/2016 1106   LDLDIRECT 118.7 07/30/2011 1101    Additional studies/ records that were reviewed today include:  Coronary CT angios 07/27/17:   IMPRESSION: 1. Coronary calcium score of 12. This was 15 percentile for age and sex matched control.   2. Normal coronary origin with right dominance.   3. 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 Aggressive risk factor modification is recommended.   4. Pulmonary artery is mildly dilated measuring 32 x 30 mm suggestive of pulmonary hypertension.    Nuclear stress test performed 05/07/2017: Study Highlights   Nuclear stress EF: 41%.  Inferior/inferolateral defect consistent with scar and possible soft tissue attenuation No ischemia  Intermediate risk study   Echocardiogram 07/15/2017: Study Conclusions   - Left ventricle: The cavity size was normal.  Systolic function was   normal. The estimated ejection fraction was in the range of 55%   to 60%. Wall motion was normal; there were no regional wall   motion abnormalities. Left ventricular diastolic function   parameters were normal. - Left atrium: The atrium was moderately dilated. - Right atrium: The atrium was mildly dilated. - Atrial septum: No defect or patent foramen ovale was identified.     ASSESSMENT:    1. Coronary artery calcification seen on CT scan   2. Hyperlipidemia, unspecified hyperlipidemia type   3. Essential hypertension      PLAN:  In order of problems listed above:  1. Primary prevention to control moderate plaque identified  on coronary CTA.  LDL less than 70 and blood pressure less than 130/85 mmHg. 2. Atorvastatin 10 mg/day has been started.  He will take the medication for 8 weeks and then have liver and lipid panel performed. 3. Continue current excellent blood pressure control.  Clinical follow-up for discussion in 1 year.    Medication Adjustments/Labs and Tests Ordered: Current medicines are reviewed at length with the patient today.  Concerns regarding medicines are outlined above.  Medication changes, Labs and Tests ordered today are listed in the Patient Instructions below. Patient Instructions  Medication Instructions:  Your physician recommends that you continue on your current medications as directed. Please refer to the Current Medication list given to you today.  Labwork: Your physician recommends that you return for lab work in: 2 months (Lipid, liver)   Testing/Procedures: None  Follow-Up: Your physician wants you to follow-up in: 1 year with Dr. Tamala Julian.  You will receive a reminder letter in the mail two months in advance. If you don't receive a letter, please call our office to schedule the follow-up appointment.   Any Other Special Instructions Will Be Listed Below (If Applicable).     If you need a refill on your cardiac  medications before your next appointment, please call your pharmacy.      Signed, Sinclair Grooms, MD  08/26/2017 11:16 AM    [facility] [address], [place], [place]  [address] Phone: [phone]; Fax: [phone]

## 2017-08-26 NOTE — Patient Instructions (Signed)
Medication Instructions:  Your physician recommends that you continue on your current medications as directed. Please refer to the Current Medication list given to you today.  Labwork: Your physician recommends that you return for lab work in: 2 months (Lipid, liver)   Testing/Procedures: None  Follow-Up: Your physician wants you to follow-up in: 1 year with Dr. Tamala Julian.  You will receive a reminder letter in the mail two months in advance. If you don't receive a letter, please call our office to schedule the follow-up appointment.   Any Other Special Instructions Will Be Listed Below (If Applicable).     If you need a refill on your cardiac medications before your next appointment, please call your pharmacy.

## 2017-09-23 ENCOUNTER — Encounter: Payer: Self-pay | Admitting: Internal Medicine

## 2017-09-23 ENCOUNTER — Ambulatory Visit (INDEPENDENT_AMBULATORY_CARE_PROVIDER_SITE_OTHER): Payer: Medicare Other | Admitting: Internal Medicine

## 2017-09-23 VITALS — BP 114/78 | HR 68 | Temp 97.5°F | Resp 14 | Ht 71.0 in | Wt 245.0 lb

## 2017-09-23 DIAGNOSIS — Z0000 Encounter for general adult medical examination without abnormal findings: Secondary | ICD-10-CM | POA: Diagnosis not present

## 2017-09-23 LAB — COMPREHENSIVE METABOLIC PANEL
ALT: 25 U/L (ref 0–53)
AST: 20 U/L (ref 0–37)
Albumin: 4.3 g/dL (ref 3.5–5.2)
Alkaline Phosphatase: 50 U/L (ref 39–117)
BILIRUBIN TOTAL: 1 mg/dL (ref 0.2–1.2)
BUN: 19 mg/dL (ref 6–23)
CALCIUM: 9.6 mg/dL (ref 8.4–10.5)
CO2: 29 meq/L (ref 19–32)
Chloride: 106 mEq/L (ref 96–112)
Creatinine, Ser: 1.31 mg/dL (ref 0.40–1.50)
GFR: 70.46 mL/min (ref >60.00)
GLUCOSE: 102 mg/dL — AB (ref 70–99)
POTASSIUM: 4.3 meq/L (ref 3.5–5.1)
Sodium: 143 mEq/L (ref 135–145)
Total Protein: 7.8 g/dL (ref 6.0–8.3)

## 2017-09-23 LAB — TIQ-NTM

## 2017-09-23 NOTE — Patient Instructions (Signed)
GO TO THE LAB : Get the blood work     GO TO THE FRONT DESK Schedule your next appointment for a   checkup in 8 months 

## 2017-09-23 NOTE — Assessment & Plan Note (Signed)
CP: w/u showed mild coronary disease per CT.  Plan is good control of cardiovascular risk factors.  LDL goal 70. Hyperlipidemia: cards RX Lipitor a few weeks ago, good compliance and tolerance, pt likes cardiology to follow-up on that issue.  We will see them 10-2016. HTN: Well-controlled, decline to check ambulatory BPs.  Continue amlodipine and losartan BPH: sx not bothersome, on Flomax as needed. RTC 8 months

## 2017-09-23 NOTE — Progress Notes (Signed)
Pre visit review using our clinic review tool, if applicable. No additional management support is needed unless otherwise documented below in the visit note. 

## 2017-09-23 NOTE — Assessment & Plan Note (Addendum)
-  Td  2011;   declined  all shots -CCS: Colonoscopy 2004 wnl Scope 02-2014, + Polyp, high-grade dysplasia Had a cscope ~ 03-2015, next per Dr. Michail Sermon (5 years per pt) -prostate cancer screening: DRE PSA  06-2016 wnl; used to see urology for BPH  -Labs: CMP, blood type (per patient request) Discussed healthy lifestyle

## 2017-09-23 NOTE — Progress Notes (Signed)
Subjective:    Patient ID: Levi Jones, male    DOB: 01-30-1951, 66 y.o.   MRN: [id number]  DOS:  09/23/2017 Type of visit - description :  cpx Interval history: Since the last office visit he had extensive cardiac workup which was reviewed   Review of Systems Additionally, left eye is watery.  Denies  pain redness problems with his vision Nocturia sometimes, anywhere between 1 and 4 at night.  Other than above, a 14 point review of systems is negative    Past Medical History:  Diagnosis Date  . Abnormal LFTs 07/01/2016  . ALLERGIC RHINITIS CAUSE UNSPECIFIED 03/05/2010   Qualifier: Diagnosis of  By: Stann Mainland CMA, [place]    . Anemia 07/02/2016  . Benign localized hyperplasia of prostate with urinary obstruction   . CAP (community acquired pneumonia) 07/01/2016  . 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 Hypertension   . Nocturia   . Osteoarthritis   . Sarcoidosis    h/o, DX in the 90's to early 2000s (Dr.Simonds)  . Sciatica 09/27/2014  . Thyromegaly 03/28/2015    Past Surgical History:  Procedure Laterality Date  . FOOT SURGERY Left     remotely, at [facility], podiatry.  Marland Kitchen KNEE ARTHROSCOPY     Right x2  . KNEE ARTHROSCOPY     Left x1    Social History   Socioeconomic History  . Marital status: Married    Spouse name: Not on file  . Number of children: 2  . Years of education: Not on file  . Highest education level: Not on file  Social Needs  . Financial resource strain: Not on file  . Food insecurity - worry: Not on file  . Food insecurity - inability: Not on file  . Transportation needs - medical: Not on file  . Transportation needs - non-medical: Not on file  Occupational History  . Occupation: RETIRED 12-2016, works part time--[employer]    Employer: [employer]  Tobacco Use  . Smoking status:  Never Smoker  . Smokeless tobacco: Never Used  Substance and Sexual Activity  . Alcohol use: No  . Drug use: No  . Sexual activity: Not on file  Other Topics Concern  . Not on file  Social History Narrative    Household-- pt, wife and daughter   2 adult children     Family History  Problem Relation Age of Onset  . Lung cancer Father        smoker  . Coronary artery disease Mother        stents at age 15  . Hypertension Mother   . Heart failure Mother   . Prostate cancer Neg Hx   . Colon cancer Neg Hx   . Stroke Neg Hx      Allergies as of 09/23/2017   No Known Allergies     Medication List        Accurate as of 09/23/17  5:30 PM. Always use your most recent med list.          amLODipine 10 MG tablet Commonly known as:  NORVASC Take 1 tablet (10 mg total) by mouth daily.   aspirin EC 81 MG tablet Take 81 mg by mouth daily.   atorvastatin 10 MG tablet Commonly known as:  LIPITOR Take 1 tablet (10 mg total) by  mouth daily.   ibuprofen 800 MG tablet Commonly known as:  ADVIL,MOTRIN Take 1 tablet (800 mg total) by mouth daily as needed for pain. Take with food.   loratadine 10 MG tablet Commonly known as:  CLARITIN Take 1 tablet (10 mg total) by mouth daily as needed for allergies.   losartan 100 MG tablet Commonly known as:  COZAAR Take 1 tablet (100 mg total) by mouth daily.   NON FORMULARY [facility]  Onychomycosis Nail Lacquer -  Fluconazole 2%, Terbinafine 1% DMSO Apply to affected nail once daily Qty. 120 gm 3 refills   nystatin-triamcinolone ointment Commonly known as:  MYCOLOG Apply 1 application topically 2 (two) times daily as needed. Use sparingly   PRESCRIPTION MEDICATION NAME - C ONYCHOMYCOSIS COMPIUND MEDICATION Apply to affected toes and nails daily as directed by MD.   tamsulosin 0.4 MG Caps capsule Commonly known as:  FLOMAX Take 1 capsule by mouth daily.          Objective:   Physical Exam BP 114/78 (BP  Location: Left Arm, Patient Position: Sitting, Cuff Size: Small)   Pulse 68   Temp (!) 97.5 F (36.4 C) (Oral)   Resp 14   Ht 5\' 11"  (1.803 m)   Wt 245 lb (111.1 kg)   SpO2 97%   BMI 34.17 kg/m  General:   Well developed, well nourished . NAD.  Neck: No  thyromegaly  HEENT:  Normocephalic . Face symmetric, atraumatic Lungs:  CTA B Normal respiratory effort, no intercostal retractions, no accessory muscle use. Heart: RRR,  no murmur.  No pretibial edema bilaterally  Abdomen:  Not distended, soft, non-tender. No rebound or rigidity.   Skin: Exposed areas without rash. Not pale. Not jaundice Neurologic:  alert & oriented X3.  Speech normal, gait appropriate for age and unassisted Strength symmetric and appropriate for age.  Psych: Cognition and judgment appear intact.  Cooperative with normal attention span and concentration.  Behavior appropriate. No anxious or depressed appearing.     Assessment & Plan:   Assessment   HTN Lipidemia: Started Lipitor 10-18 DJD BPH last visit w/ urology 01-2016  Sarcoidosis, dx 1990s, Dr. Alva Garnet Eczema (L.E.) LLL PNM 06-2016 C/o CP: 06-2017: Abnormal stress test, EF decreased; 07-2017: Echo: wnl  EF, coronary CT: Mild disease.  Plan: CV RF control, LDL goal 70   PLAN: CP: w/u showed mild coronary disease per CT.  Plan is good control of cardiovascular risk factors.  LDL goal 70. Hyperlipidemia: cards RX Lipitor a few weeks ago, good compliance and tolerance, pt likes cardiology to follow-up on that issue.  We will see them 10-2016. HTN: Well-controlled, decline to check ambulatory BPs.  Continue amlodipine and losartan BPH: sx not bothersome, on Flomax as needed. RTC 8 months

## 2017-09-25 ENCOUNTER — Telehealth: Payer: Self-pay | Admitting: Emergency Medicine

## 2017-09-25 NOTE — Telephone Encounter (Signed)
Opened in error

## 2017-09-28 ENCOUNTER — Telehealth: Payer: Self-pay

## 2017-09-28 LAB — ABO AND RH

## 2017-09-28 MED ORDER — HYDROCORTISONE ACETATE 25 MG RE SUPP: 25.0000 mg | Freq: Two times a day (BID) | RECTAL | 1 refills | Status: DC | PRN

## 2017-09-28 NOTE — Telephone Encounter (Signed)
Copied from [place] [id number]. Topic: General - Other >> Sep 28, 2017  3:02 PM Carolyn Stare wrote: Reason for CRM:      Pt wife call to say pt is having issues with hemorrhoids and is asking if something can be called in  [place]

## 2017-09-28 NOTE — Telephone Encounter (Signed)
Please advise 

## 2017-09-28 NOTE — Telephone Encounter (Signed)
LMOM informing Pt of recommendations. Instructed to call if questions/concerns.  

## 2017-09-28 NOTE — Telephone Encounter (Signed)
If he has itching or discomfort for: Use the  suppository I sent to his pharmacy. Also OK to use a  ointment called NUPERCAINAL as needed (OTC) Office visit if severe symptoms, severe bleeding or pain.

## 2017-10-01 ENCOUNTER — Other Ambulatory Visit: Payer: Medicare Other

## 2017-10-23 ENCOUNTER — Other Ambulatory Visit: Payer: Medicare Other

## 2017-10-26 ENCOUNTER — Other Ambulatory Visit: Payer: Medicare Other | Admitting: *Deleted

## 2017-10-26 DIAGNOSIS — E785 Hyperlipidemia, unspecified: Secondary | ICD-10-CM

## 2017-10-26 LAB — HEPATIC FUNCTION PANEL
ALT: 27 IU/L (ref 0–44)
AST: 20 IU/L (ref 0–40)
Albumin: 4.3 g/dL (ref 3.6–4.8)
Alkaline Phosphatase: 53 IU/L (ref 39–117)
Bilirubin Total: 0.7 mg/dL (ref 0.0–1.2)
Bilirubin, Direct: 0.23 mg/dL (ref 0.00–0.40)
Total Protein: 7 g/dL (ref 6.0–8.5)

## 2017-10-26 LAB — LIPID PANEL
CHOL/HDL RATIO: 2.1 ratio (ref 0.0–5.0)
Cholesterol, Total: 152 mg/dL (ref 100–199)
HDL: 72 mg/dL (ref >39)
LDL CALC: 60 mg/dL (ref 0–99)
TRIGLYCERIDES: 100 mg/dL (ref 0–149)
VLDL Cholesterol Cal: 20 mg/dL (ref 5–40)

## 2017-10-28 ENCOUNTER — Encounter: Payer: Self-pay | Admitting: *Deleted

## 2017-10-29 ENCOUNTER — Other Ambulatory Visit: Payer: Self-pay

## 2017-10-29 MED ORDER — LORATADINE 10 MG PO TABS: 10.0000 mg | ORAL_TABLET | Freq: Every day | ORAL | 3 refills | Status: DC | PRN

## 2017-11-24 ENCOUNTER — Ambulatory Visit (INDEPENDENT_AMBULATORY_CARE_PROVIDER_SITE_OTHER): Payer: Medicare Other | Admitting: Internal Medicine

## 2017-11-24 ENCOUNTER — Encounter: Payer: Self-pay | Admitting: Internal Medicine

## 2017-11-24 VITALS — BP 124/78 | HR 75 | Temp 97.9°F | Resp 14 | Ht 71.0 in | Wt 247.5 lb

## 2017-11-24 DIAGNOSIS — M545 Low back pain, unspecified: Secondary | ICD-10-CM

## 2017-11-24 MED ORDER — CYCLOBENZAPRINE HCL 10 MG PO TABS: 10.0000 mg | ORAL_TABLET | Freq: Every evening | ORAL | 0 refills | Status: DC | PRN

## 2017-11-24 MED ORDER — PREDNISONE 10 MG PO TABS: ORAL_TABLET | ORAL | 0 refills | Status: DC

## 2017-11-24 NOTE — Patient Instructions (Addendum)
Rest  Use heating pad or ice pack twice daily as needed  Prednisone as prescribed   For pain okay to take Tylenol  500 mg OTC 2 tabs a day every 8 hours as needed   You can also take cyclobenzaprine, a muscle relaxant at bedtime. Will cause drowsiness  Call if not gradually better in the next 10 days

## 2017-11-24 NOTE — Progress Notes (Signed)
Pre visit review using our clinic review tool, if applicable. No additional management support is needed unless otherwise documented below in the visit note. 

## 2017-11-24 NOTE — Progress Notes (Signed)
Subjective:    Patient ID: Levi Jones, male    DOB: 07/30/1951, 67 y.o.   MRN: [id number]  DOS:  11/24/2017 Type of visit - description : acute, here w/ his wife Interval history: Symptoms started 3 days ago, he woke up with low bilateral back pain. Since then, the pain has concentrated on the left side. It goes away or increase depending on his position.  Usually standing up and turning to the left make the pain worse. Denies radiation or lower extremity paresthesias. He has episodic back pains throughout his life.  Review of Systems  Denies fever chills No injury or overuse.  No rash No dysuria or gross hematuria  Past Medical History:  Diagnosis Date  . Abnormal LFTs 07/01/2016  . ALLERGIC RHINITIS CAUSE UNSPECIFIED 03/05/2010   Qualifier: Diagnosis of  By: Stann Mainland CMA, [place]    . Anemia 07/02/2016  . Benign localized hyperplasia of prostate with urinary obstruction   . CAP (community acquired pneumonia) 07/01/2016  . 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 Hypertension   . Nocturia   . Osteoarthritis   . Sarcoidosis    h/o, DX in the 90's to early 2000s (Dr.Simonds)  . Sciatica 09/27/2014  . Thyromegaly 03/28/2015    Past Surgical History:  Procedure Laterality Date  . FOOT SURGERY Left     remotely, at [facility], podiatry.  Marland Kitchen KNEE ARTHROSCOPY     Right x2  . KNEE ARTHROSCOPY     Left x1    Social History   Socioeconomic History  . Marital status: Married    Spouse name: Not on file  . Number of children: 2  . Years of education: Not on file  . Highest education level: Not on file  Social Needs  . Financial resource strain: Not on file  . Food insecurity - worry: Not on file  . Food insecurity - inability: Not on file  . Transportation needs - medical: Not on file  . Transportation needs -  non-medical: Not on file  Occupational History  . Occupation: RETIRED 12-2016, works part time--[employer]    Employer: [employer]  Tobacco Use  . Smoking status: Never Smoker  . Smokeless tobacco: Never Used  Substance and Sexual Activity  . Alcohol use: No  . Drug use: No  . Sexual activity: Not on file  Other Topics Concern  . Not on file  Social History Narrative    Household-- pt, wife and daughter   2 adult children      Allergies as of 11/24/2017   No Known Allergies     Medication List        Accurate as of 11/24/17 11:59 PM. Always use your most recent med list.          amLODipine 10 MG tablet Commonly known as:  NORVASC Take 1 tablet (10 mg total) by mouth daily.   aspirin EC 81 MG tablet Take 81 mg by mouth daily.   atorvastatin 10 MG tablet Commonly known as:  LIPITOR Take 1 tablet (10 mg total) by mouth daily.   cyclobenzaprine 10 MG tablet Commonly known as:  FLEXERIL Take 1 tablet (10 mg total) by mouth at bedtime as needed for muscle spasms.   hydrocortisone 25 MG suppository Commonly known as:  ANUSOL-HC Place 1 suppository (25 mg total) rectally  2 (two) times daily as needed for hemorrhoids.   loratadine 10 MG tablet Commonly known as:  CLARITIN Take 1 tablet (10 mg total) by mouth daily as needed for allergies.   losartan 100 MG tablet Commonly known as:  COZAAR Take 1 tablet (100 mg total) by mouth daily.   NON FORMULARY [facility]  Onychomycosis Nail Lacquer -  Fluconazole 2%, Terbinafine 1% DMSO Apply to affected nail once daily Qty. 120 gm 3 refills   nystatin-triamcinolone ointment Commonly known as:  MYCOLOG Apply 1 application topically 2 (two) times daily as needed. Use sparingly   predniSONE 10 MG tablet Commonly known as:  DELTASONE 4 tablets x 2 days, 3 tabs x 2 days, 2 tabs x 2 days, 1 tab x 2 days   PRESCRIPTION MEDICATION NAME - C ONYCHOMYCOSIS COMPIUND MEDICATION Apply to affected toes and nails  daily as directed by MD.   tamsulosin 0.4 MG Caps capsule Commonly known as:  FLOMAX Take 1 capsule by mouth daily.          Objective:   Physical Exam  Musculoskeletal:       Back:   BP 124/78 (BP Location: Left Arm, Patient Position: Sitting, Cuff Size: Normal)   Pulse 75   Temp 97.9 F (36.6 C) (Oral)   Resp 14   Ht 5\' 11"  (1.803 m)   Wt 247 lb 8 oz (112.3 kg)   SpO2 96%   BMI 34.52 kg/m  General:   Well developed, well nourished . NAD.  HEENT:  Normocephalic . Face symmetric, atraumatic MSK: Slightly TTP on the back, no TTP at the sacroiliac areas.  See graphic. Hip rotation: Normal bilaterally, mild exacerbation of back pain with left hip rotation. Skin: Not pale. Not jaundice Neurologic:  alert & oriented X3.  Speech normal, gait appropriate for age and unassisted.  DTRs symmetric (both ankle jerks are slightly decreased) Straight leg test negative. Psych--  Cognition and judgment appear intact.  Cooperative with normal attention span and concentration.  Behavior appropriate. No anxious or depressed appearing.      Assessment & Plan:    Assessment   HTN Lipidemia: Started Lipitor 10-18 DJD BPH last visit w/ urology 01-2016  Sarcoidosis, dx 1990s, Dr. Alva Garnet Eczema (L.E.) LLL PNM 06-2016 C/o CP: 06-2017: Abnormal stress test, EF decreased; 07-2017: Echo: wnl  EF, coronary CT: Mild disease. Saw cards.  Plan: CV RF control, LDL goal 70   PLAN: Lumbalgia: Presents with lumbalgia, no radiculopathy on clinical grounds.  Rec conservative treatment with prednisone, Flexeril, Tylenol.  We talk about possibly hydrocodone but in the past it caused nausea.  If he needs more pain control we could consider Ultram. Call if not improving, see AVS.

## 2017-11-25 NOTE — Assessment & Plan Note (Signed)
Lumbalgia: Presents with lumbalgia, no radiculopathy on clinical grounds.  Rec conservative treatment with prednisone, Flexeril, Tylenol.  We talk about possibly hydrocodone but in the past it caused nausea.  If he needs more pain control we could consider Ultram. Call if not improving, see AVS.

## 2017-11-26 ENCOUNTER — Encounter: Payer: Self-pay | Admitting: Internal Medicine

## 2017-11-26 ENCOUNTER — Ambulatory Visit: Payer: Medicare Other | Admitting: Internal Medicine

## 2017-11-26 ENCOUNTER — Ambulatory Visit (INDEPENDENT_AMBULATORY_CARE_PROVIDER_SITE_OTHER): Payer: Medicare Other | Admitting: Internal Medicine

## 2017-11-26 VITALS — BP 134/82 | HR 68 | Temp 98.1°F | Resp 16 | Ht 71.0 in | Wt 251.0 lb

## 2017-11-26 DIAGNOSIS — M545 Low back pain, unspecified: Secondary | ICD-10-CM

## 2017-11-26 NOTE — Progress Notes (Signed)
Subjective:    Patient ID: Levi Jones, male    DOB: July 16, 1951, 67 y.o.   MRN: [id number]  DOS:  11/26/2017 Type of visit - description : acute Interval history: Was seen a couple of days ago with back pain, treated with Flexeril, prednisone, heating pad. He actually felt much better, went to work today and developed again a left-sided back pain after walking. He is here because the resurface of pain. "I think a panic"  Review of Systems Denies pain being severe, it is only when he walks, denies any tingling or numbness in the lower extremities.  Past Medical History:  Diagnosis Date  . Abnormal LFTs 07/01/2016  . ALLERGIC RHINITIS CAUSE UNSPECIFIED 03/05/2010   Qualifier: Diagnosis of  By: Stann Mainland CMA, [place]    . Anemia 07/02/2016  . Benign localized hyperplasia of prostate with urinary obstruction   . CAP (community acquired pneumonia) 07/01/2016  . 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 ERECTILE DYSFUNCTION, ORGANIC 01/02/2010   Qualifier: Diagnosis of  By: Larose Kells MD, [place] Folliculitis barbae 1/76/1607  . Hypertension   . Nocturia   . Osteoarthritis   . Sarcoidosis    h/o, DX in the 90's to early 2000s (Dr.Simonds)  . Sciatica 09/27/2014  . Thyromegaly 03/28/2015    Past Surgical History:  Procedure Laterality Date  . FOOT SURGERY Left     remotely, at [facility], podiatry.  Marland Kitchen KNEE ARTHROSCOPY     Right x2  . KNEE ARTHROSCOPY     Left x1    Social History   Socioeconomic History  . Marital status: Married    Spouse name: Not on file  . Number of children: 2  . Years of education: Not on file  . Highest education level: Not on file  Social Needs  . Financial resource strain: Not on file  . Food insecurity - worry: Not on file  . Food insecurity - inability: Not on file  . Transportation needs - medical: Not on file  . Transportation needs - non-medical: Not on file  Occupational History  . Occupation: RETIRED 12-2016,  works part time--[employer]    Employer: [employer]  Tobacco Use  . Smoking status: Never Smoker  . Smokeless tobacco: Never Used  Substance and Sexual Activity  . Alcohol use: No  . Drug use: No  . Sexual activity: Not on file  Other Topics Concern  . Not on file  Social History Narrative    Household-- pt, wife and daughter   2 adult children      Allergies as of 11/26/2017   No Known Allergies     Medication List        Accurate as of 11/26/17  6:51 PM. Always use your most recent med list.          amLODipine 10 MG tablet Commonly known as:  NORVASC Take 1 tablet (10 mg total) by mouth daily.   aspirin EC 81 MG tablet Take 81 mg by mouth daily.   atorvastatin 10 MG tablet Commonly known as:  LIPITOR Take 1 tablet (10 mg total) by mouth daily.   cyclobenzaprine 10 MG tablet Commonly known as:  FLEXERIL Take 1 tablet (10 mg total) by mouth at bedtime as needed for muscle spasms.   hydrocortisone 25 MG suppository Commonly known as:  ANUSOL-HC Place 1 suppository (25 mg total) rectally 2 (two) times daily as needed for hemorrhoids.   loratadine 10 MG  tablet Commonly known as:  CLARITIN Take 1 tablet (10 mg total) by mouth daily as needed for allergies.   losartan 100 MG tablet Commonly known as:  COZAAR Take 1 tablet (100 mg total) by mouth daily.   NON FORMULARY [facility]  Onychomycosis Nail Lacquer -  Fluconazole 2%, Terbinafine 1% DMSO Apply to affected nail once daily Qty. 120 gm 3 refills   nystatin-triamcinolone ointment Commonly known as:  MYCOLOG Apply 1 application topically 2 (two) times daily as needed. Use sparingly   predniSONE 10 MG tablet Commonly known as:  DELTASONE 4 tablets x 2 days, 3 tabs x 2 days, 2 tabs x 2 days, 1 tab x 2 days   PRESCRIPTION MEDICATION NAME - C ONYCHOMYCOSIS COMPIUND MEDICATION Apply to affected toes and nails daily as directed by MD.   tamsulosin 0.4 MG Caps capsule Commonly known as:   FLOMAX Take 1 capsule by mouth daily.          Objective:   Physical Exam BP 134/82 (BP Location: Right Arm, Patient Position: Sitting, Cuff Size: Large)   Pulse 68   Temp 98.1 F (36.7 C) (Oral)   Resp 16   Ht 5\' 11"  (1.803 m)   Wt 251 lb (113.9 kg)   SpO2 96%   BMI 35.01 kg/m   General:   Well developed, well nourished . NAD.  HEENT:  Normocephalic . Face symmetric, atraumatic Skin: Not pale. Not jaundice Neurologic:  alert & oriented X3.  Speech normal, gait appropriate for age and unassisted Motor and DTRs symmetric.  No antalgic gait or posture Psych--  Cognition and judgment appear intact.  Cooperative with normal attention span and concentration.  Behavior appropriate. No anxious or depressed appearing.      Assessment & Plan:    Assessment   HTN Lipidemia: Started Lipitor 10-18 DJD BPH last visit w/ urology 01-2016  Sarcoidosis, dx 1990s, Dr. Alva Garnet Eczema (L.E.) LLL PNM 06-2016 C/o CP: 06-2017: Abnormal stress test, EF decreased; 07-2017: Echo: wnl  EF, coronary CT: Mild disease. Saw cards.  Plan: CV RF control, LDL goal 70   PLAN: Lumbalgia: Actually improving with current treatment, recommend to continue with Flexeril, prednisone, heating pad.  Once better, I recommend stretching exercises to prevent further episodes, we went over the exercises, see AVS

## 2017-11-26 NOTE — Assessment & Plan Note (Signed)
Lumbalgia: Actually improving with current treatment, recommend to continue with Flexeril, prednisone, heating pad.  Once better, I recommend stretching exercises to prevent further episodes, we went over the exercises, see

## 2017-11-26 NOTE — Patient Instructions (Signed)
Continue the same plan   Back Exercises If you have pain in your back, do these exercises 2-3 times each day or as told by your doctor. When the pain goes away, do the exercises once each day, but repeat the steps more times for each exercise (do more repetitions). If you do not have pain in your back, do these exercises once each day or as told by your doctor. Exercises Single Knee to Chest  Do these steps 3-5 times in a row for each leg: 1. Lie on your back on a firm bed or the floor with your legs stretched out. 2. Bring one knee to your chest. 3. Hold your knee to your chest by grabbing your knee or thigh. 4. Pull on your knee until you feel a gentle stretch in your lower back. 5. Keep doing the stretch for 10-30 seconds. 6. Slowly let go of your leg and straighten it.  Pelvic Tilt  Do these steps 5-10 times in a row: 1. Lie on your back on a firm bed or the floor with your legs stretched out. 2. Bend your knees so they point up to the ceiling. Your feet should be flat on the floor. 3. Tighten your lower belly (abdomen) muscles to press your lower back against the floor. This will make your tailbone point up to the ceiling instead of pointing down to your feet or the floor. 4. Stay in this position for 5-10 seconds while you gently tighten your muscles and breathe evenly.  Cat-Cow  Do these steps until your lower back bends more easily: 1. Get on your hands and knees on a firm surface. Keep your hands under your shoulders, and keep your knees under your hips. You may put padding under your knees. 2. Let your head hang down, and make your tailbone point down to the floor so your lower back is round like the back of a cat. 3. Stay in this position for 5 seconds. 4. Slowly lift your head and make your tailbone point up to the ceiling so your back hangs low (sags) like the back of a cow. 5. Stay in this position for 5 seconds.  Press-Ups  Do these steps 5-10 times in a row: 1. Lie  on your belly (face-down) on the floor. 2. Place your hands near your head, about shoulder-width apart. 3. While you keep your back relaxed and keep your hips on the floor, slowly straighten your arms to raise the top half of your body and lift your shoulders. Do not use your back muscles. To make yourself more comfortable, you may change where you place your hands. 4. Stay in this position for 5 seconds. 5. Slowly return to lying flat on the floor.  Bridges  Do these steps 10 times in a row: 1. Lie on your back on a firm surface. 2. Bend your knees so they point up to the ceiling. Your feet should be flat on the floor. 3. Tighten your butt muscles and lift your butt off of the floor until your waist is almost as high as your knees. If you do not feel the muscles working in your butt and the back of your thighs, slide your feet 1-2 inches farther away from your butt. 4. Stay in this position for 3-5 seconds. 5. Slowly lower your butt to the floor, and let your butt muscles relax.  If this exercise is too easy, try doing it with your arms crossed over your chest. Belly Crunches  Do these steps 5-10 times in a row: 1. Lie on your back on a firm bed or the floor with your legs stretched out. 2. Bend your knees so they point up to the ceiling. Your feet should be flat on the floor. 3. Cross your arms over your chest. 4. Tip your chin a little bit toward your chest but do not bend your neck. 5. Tighten your belly muscles and slowly raise your chest just enough to lift your shoulder blades a tiny bit off of the floor. 6. Slowly lower your chest and your head to the floor.  Back Lifts Do these steps 5-10 times in a row: 1. Lie on your belly (face-down) with your arms at your sides, and rest your forehead on the floor. 2. Tighten the muscles in your legs and your butt. 3. Slowly lift your chest off of the floor while you keep your hips on the floor. Keep the back of your head in line with the  curve in your back. Look at the floor while you do this. 4. Stay in this position for 3-5 seconds. 5. Slowly lower your chest and your face to the floor.  Contact a doctor if:  Your back pain gets a lot worse when you do an exercise.  Your back pain does not lessen 2 hours after you exercise. If you have any of these problems, stop doing the exercises. Do not do them again unless your doctor says it is okay. Get help right away if:  You have sudden, very bad back pain. If this happens, stop doing the exercises. Do not do them again unless your doctor says it is okay. This information is not intended to replace advice given to you by your health care provider. Make sure you discuss any questions you have with your health care provider. Document Released: 11/15/2010 Document Revised: 03/20/2016 Document Reviewed: 12/07/2014 Elsevier Interactive Patient Education  [employer].

## 2017-11-26 NOTE — Progress Notes (Signed)
Pre visit review using our clinic review tool, if applicable. No additional management support is needed unless otherwise documented below in the visit note. 

## 2017-12-08 ENCOUNTER — Ambulatory Visit: Payer: Medicare Other | Admitting: Internal Medicine

## 2017-12-15 ENCOUNTER — Ambulatory Visit (INDEPENDENT_AMBULATORY_CARE_PROVIDER_SITE_OTHER): Payer: Medicare Other | Admitting: Internal Medicine

## 2017-12-15 ENCOUNTER — Encounter: Payer: Self-pay | Admitting: Internal Medicine

## 2017-12-15 VITALS — BP 126/78 | HR 82 | Temp 98.3°F | Resp 14 | Ht 71.0 in | Wt 249.0 lb

## 2017-12-15 DIAGNOSIS — J069 Acute upper respiratory infection, unspecified: Secondary | ICD-10-CM | POA: Diagnosis not present

## 2017-12-15 MED ORDER — AZELASTINE HCL 0.1 % NA SOLN: 2.0000 | Freq: Every evening | NASAL | 3 refills | Status: DC | PRN

## 2017-12-15 MED ORDER — BENZONATATE 200 MG PO CAPS: 200.0000 mg | ORAL_CAPSULE | Freq: Three times a day (TID) | ORAL | 0 refills | Status: DC | PRN

## 2017-12-15 MED ORDER — AMOXICILLIN 500 MG PO CAPS: 1000.0000 mg | ORAL_CAPSULE | Freq: Two times a day (BID) | ORAL | 0 refills | Status: DC

## 2017-12-15 NOTE — Assessment & Plan Note (Signed)
URI: Rec  conservative treatment first with Tylenol, fluids, Mucinex DM, Tessalon Perles, Flonase, Astelin.  Only if not better in few days to start amoxicillin.  Will call if sxs severe

## 2017-12-15 NOTE — Progress Notes (Signed)
Pre visit review using our clinic review tool, if applicable. No additional management support is needed unless otherwise documented below in the visit note. 

## 2017-12-15 NOTE — Patient Instructions (Signed)
Rest, fluids , tylenol  For cough:  Take Mucinex DM twice a day as needed until better If the cough continue, use tessalon perles   For nasal congestion: Use OTC  Flonase : 2 nasal sprays on each side of the nose in the morning until you feel better Use ASTELIN a prescribed spray : 2 nasal sprays on each side of the nose at night until you feel better  Avoid decongestants such as  Pseudoephedrine or phenylephrine    Take the antibiotic as prescribed  (Amoxicillin) IF NO BETTER in 5-6 days   Call if not gradually better   Call anytime if the symptoms are severe

## 2017-12-15 NOTE — Progress Notes (Signed)
Subjective:    Patient ID: Levi Jones, male    DOB: 11/08/1950, 67 y.o.   MRN: [id number]  DOS:  12/15/2017 Type of visit - description : acute, here with his wife Interval history: Symptoms started approximately 10 days ago, dry cough with occasional sputum production, yellow in color. Having some chills Taking OTC Robitussin-DM without much help and also "cough drops"   Review of Systems Denies fevers. Admits to clear nasal discharge.  No sore throat Denies generalized aches No nausea, vomiting, diarrhea  Past Medical History:  Diagnosis Date  . Abnormal LFTs 07/01/2016  . ALLERGIC RHINITIS CAUSE UNSPECIFIED 03/05/2010   Qualifier: Diagnosis of  By: Stann Mainland CMA, [place]    . Anemia 07/02/2016  . Benign localized hyperplasia of prostate with urinary obstruction   . CAP (community acquired pneumonia) 07/01/2016  . 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 ERECTILE DYSFUNCTION, ORGANIC 01/02/2010   Qualifier: Diagnosis of  By: Larose Kells MD, [place] Folliculitis barbae 7/35/3299  . Hypertension   . Nocturia   . Osteoarthritis   . Sarcoidosis    h/o, DX in the 90's to early 2000s (Dr.Simonds)  . Sciatica 09/27/2014  . Thyromegaly 03/28/2015    Past Surgical History:  Procedure Laterality Date  . FOOT SURGERY Left     remotely, at [facility], podiatry.  Marland Kitchen KNEE ARTHROSCOPY     Right x2  . KNEE ARTHROSCOPY     Left x1    Social History   Socioeconomic History  . Marital status: Married    Spouse name: Not on file  . Number of children: 2  . Years of education: Not on file  . Highest education level: Not on file  Social Needs  . Financial resource strain: Not on file  . Food insecurity - worry: Not on file  . Food insecurity - inability: Not on file  . Transportation needs - medical: Not on file  . Transportation needs - non-medical: Not on file  Occupational History  . Occupation: RETIRED 12-2016, works part time--[employer]   Employer: [employer]  Tobacco Use  . Smoking status: Never Smoker  . Smokeless tobacco: Never Used  Substance and Sexual Activity  . Alcohol use: No  . Drug use: No  . Sexual activity: Not on file  Other Topics Concern  . Not on file  Social History Narrative    Household-- pt, wife and daughter   2 adult children      Allergies as of 12/15/2017   No Known Allergies     Medication List        Accurate as of 12/15/17  6:42 PM. Always use your most recent med list.          amLODipine 10 MG tablet Commonly known as:  NORVASC Take 1 tablet (10 mg total) by mouth daily.   amoxicillin 500 MG capsule Commonly known as:  AMOXIL Take 2 capsules (1,000 mg total) by mouth 2 (two) times daily.   aspirin EC 81 MG tablet Take 81 mg by mouth daily.   atorvastatin 10 MG tablet Commonly known as:  LIPITOR Take 1 tablet (10 mg total) by mouth daily.   azelastine 0.1 % nasal spray Commonly known as:  ASTELIN Place 2 sprays into both nostrils at bedtime as needed for rhinitis. Use in each nostril as directed   benzonatate 200 MG capsule Commonly known as:  TESSALON Take 1 capsule (200 mg total) by mouth 3 (  three) times daily as needed for cough.   cyclobenzaprine 10 MG tablet Commonly known as:  FLEXERIL Take 1 tablet (10 mg total) by mouth at bedtime as needed for muscle spasms.   hydrocortisone 25 MG suppository Commonly known as:  ANUSOL-HC Place 1 suppository (25 mg total) rectally 2 (two) times daily as needed for hemorrhoids.   loratadine 10 MG tablet Commonly known as:  CLARITIN Take 1 tablet (10 mg total) by mouth daily as needed for allergies.   losartan 100 MG tablet Commonly known as:  COZAAR Take 1 tablet (100 mg total) by mouth daily.   NON FORMULARY [facility]  Onychomycosis Nail Lacquer -  Fluconazole 2%, Terbinafine 1% DMSO Apply to affected nail once daily Qty. 120 gm 3 refills   nystatin-triamcinolone ointment Commonly known as:   MYCOLOG Apply 1 application topically 2 (two) times daily as needed. Use sparingly   PRESCRIPTION MEDICATION NAME - C ONYCHOMYCOSIS COMPIUND MEDICATION Apply to affected toes and nails daily as directed by MD.   tamsulosin 0.4 MG Caps capsule Commonly known as:  FLOMAX Take 1 capsule by mouth daily.          Objective:   Physical Exam BP 126/78 (BP Location: Left Arm, Patient Position: Sitting, Cuff Size: Normal)   Pulse 82   Temp 98.3 F (36.8 C) (Oral)   Resp 14   Ht 5\' 11"  (1.803 m)   Wt 249 lb (112.9 kg)   SpO2 98%   BMI 34.73 kg/m  General:   Well developed, well nourished . NAD.  HEENT:  Normocephalic . Face symmetric, atraumatic. Nose congested, sinuses no TTP. TMs normal, throat symmetric and not red Lungs:  CTA B Normal respiratory effort, no intercostal retractions, no accessory muscle use. Heart: RRR,  no murmur.  No pretibial edema bilaterally  Skin: Not pale. Not jaundice Neurologic:  alert & oriented X3.  Speech normal, gait appropriate for age and unassisted Psych--  Cognition and judgment appear intact.  Cooperative with normal attention span and concentration.  Behavior appropriate. No anxious or depressed appearing.      Assessment & Plan:   Assessment   HTN Lipidemia: Started Lipitor 10-18 DJD BPH last visit w/ urology 01-2016  Sarcoidosis, dx 1990s, Dr. Alva Garnet Eczema (L.E.) LLL PNM 06-2016 C/o CP: 06-2017: Abnormal stress test, EF decreased; 07-2017: Echo: wnl  EF, coronary CT: Mild disease. Saw cards.  Plan: CV RF control, LDL goal 70   PLAN: URI: Rec  conservative treatment first with Tylenol, fluids, Mucinex DM, Tessalon Perles, Flonase, Astelin.  Only if not better in few days to start amoxicillin.  Will call if sxs severe

## 2017-12-16 ENCOUNTER — Telehealth: Payer: Self-pay | Admitting: Internal Medicine

## 2017-12-16 MED ORDER — FLUTICASONE PROPIONATE 50 MCG/ACT NA SUSP: 2.0000 | Freq: Every day | NASAL | 6 refills | Status: DC | PRN

## 2017-12-16 NOTE — Telephone Encounter (Signed)
Copied from [place]. Topic: Quick Communication - See Telephone Encounter >> Dec 16, 2017  8:38 AM Conception Chancy, NT wrote: CRM for notification. See Telephone encounter for:  12/16/17.  Patient spouse is calling and states the pharmacist said if Dr.Paz would call in a prescription for Flonase, pt insurance will cover it. Also states patient had to leave work early today due to feeling worse. Is requesting a sick note for work to return on 12/22/17. Patient would like it faxed to his work at [phone] attention Desiree and also would like a copy printed at the office for the spouse to come pick up to have on file. Patient wife can be contact at [phone] when note is ready. Please contact patient for any further questions.

## 2017-12-16 NOTE — Telephone Encounter (Signed)
Okay for Flonase, 2 sprays daily, #1 and 6 refills Okay work excuse.

## 2017-12-16 NOTE — Telephone Encounter (Signed)
Spoke w/ [name], informed that Flonase has been sent to [facility]. Work note faxed to number provided and [name] informed that copy of letter placed at front desk for pick up at her convenience.

## 2017-12-16 NOTE — Telephone Encounter (Signed)
Please advise 

## 2018-02-25 ENCOUNTER — Other Ambulatory Visit: Payer: Self-pay | Admitting: Internal Medicine

## 2018-03-07 ENCOUNTER — Other Ambulatory Visit: Payer: Self-pay | Admitting: Internal Medicine

## 2018-04-03 ENCOUNTER — Other Ambulatory Visit: Payer: Self-pay | Admitting: Internal Medicine

## 2018-04-14 ENCOUNTER — Telehealth: Payer: Self-pay | Admitting: Internal Medicine

## 2018-04-14 NOTE — Telephone Encounter (Signed)
Pt is requesting refill on Ibuprofen 800mg . No longer on medication list.

## 2018-04-15 NOTE — Telephone Encounter (Signed)
Prescription sent, he takes it on and off

## 2018-05-24 ENCOUNTER — Ambulatory Visit (INDEPENDENT_AMBULATORY_CARE_PROVIDER_SITE_OTHER): Payer: Medicare Other | Admitting: Internal Medicine

## 2018-05-24 ENCOUNTER — Encounter: Payer: Self-pay | Admitting: Internal Medicine

## 2018-05-24 VITALS — BP 126/68 | HR 71 | Temp 98.1°F | Resp 14 | Ht 71.0 in | Wt 248.5 lb

## 2018-05-24 DIAGNOSIS — N400 Enlarged prostate without lower urinary tract symptoms: Secondary | ICD-10-CM | POA: Diagnosis not present

## 2018-05-24 DIAGNOSIS — E785 Hyperlipidemia, unspecified: Secondary | ICD-10-CM

## 2018-05-24 DIAGNOSIS — I10 Essential (primary) hypertension: Secondary | ICD-10-CM

## 2018-05-24 LAB — CBC WITH DIFFERENTIAL/PLATELET
BASOS ABS: 0 10*3/uL (ref 0.0–0.1)
Basophils Relative: 0.6 % (ref 0.0–3.0)
Eosinophils Absolute: 0.3 10*3/uL (ref 0.0–0.7)
Eosinophils Relative: 3.9 % (ref 0.0–5.0)
HCT: 42.5 % (ref 39.0–52.0)
Hemoglobin: 13.7 g/dL (ref 13.0–17.0)
LYMPHS ABS: 1.8 10*3/uL (ref 0.7–4.0)
Lymphocytes Relative: 28.7 % (ref 12.0–46.0)
MCHC: 32.3 g/dL (ref 30.0–36.0)
MCV: 87.8 fl (ref 78.0–100.0)
Monocytes Absolute: 0.5 10*3/uL (ref 0.1–1.0)
Monocytes Relative: 8.1 % (ref 3.0–12.0)
NEUTROS PCT: 58.7 % (ref 43.0–77.0)
Neutro Abs: 3.8 10*3/uL (ref 1.4–7.7)
Platelets: 146 10*3/uL — ABNORMAL LOW (ref 150.0–400.0)
RBC: 4.84 Mil/uL (ref 4.22–5.81)
RDW: 13.4 % (ref 11.5–15.5)
WBC: 6.4 10*3/uL (ref 4.0–10.5)

## 2018-05-24 LAB — BASIC METABOLIC PANEL
BUN: 20 mg/dL (ref 6–23)
CALCIUM: 9.2 mg/dL (ref 8.4–10.5)
CO2: 26 mEq/L (ref 19–32)
Chloride: 106 mEq/L (ref 96–112)
Creatinine, Ser: 1.26 mg/dL (ref 0.40–1.50)
GFR: 73.54 mL/min (ref >60.00)
GLUCOSE: 114 mg/dL — AB (ref 70–99)
Potassium: 4.4 mEq/L (ref 3.5–5.1)
Sodium: 140 mEq/L (ref 135–145)

## 2018-05-24 NOTE — Patient Instructions (Signed)
GO TO THE LAB : Get the blood work     GO TO THE FRONT DESK Schedule your next appointment for a   physical exam in 4 to 5 months, fasting

## 2018-05-24 NOTE — Progress Notes (Signed)
Subjective:    Patient ID: Levi Jones, male    DOB: Aug 06, 1951, 67 y.o.   MRN: [id number]  DOS:  05/24/2018 Type of visit - description : rov Interval history: HTN: Good compliance of medication, no recent ambulatory BPs BPH: Not needing tamsulosin Back pain, improved.   Review of Systems Denies chest pain, difficulty breathing. No nausea, vomiting, diarrhea  Past Medical History:  Diagnosis Date  . Abnormal LFTs 07/01/2016  . ALLERGIC RHINITIS CAUSE UNSPECIFIED 03/05/2010   Qualifier: Diagnosis of  By: Stann Mainland CMA, [place]    . Anemia 07/02/2016  . Benign localized hyperplasia of prostate with urinary obstruction   . CAP (community acquired pneumonia) 07/01/2016  . 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 Hypertension   . Nocturia   . Osteoarthritis   . Sarcoidosis    h/o, DX in the 90's to early 2000s (Dr.Simonds)  . Sciatica 09/27/2014  . Thyromegaly 03/28/2015    Past Surgical History:  Procedure Laterality Date  . FOOT SURGERY Left     remotely, at [facility], podiatry.  Marland Kitchen KNEE ARTHROSCOPY     Right x2  . KNEE ARTHROSCOPY     Left x1    Social History   Socioeconomic History  . Marital status: Married    Spouse name: Not on file  . Number of children: 2  . Years of education: Not on file  . Highest education level: Not on file  Occupational History  . 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 Financial resource strain: Not on file  . Food insecurity:    Worry: Not on file    Inability: Not on file  . Transportation needs:    Medical: Not on file    Non-medical: Not on file  Tobacco Use  . Smoking status: Never Smoker  . Smokeless tobacco: Never Used  Substance and Sexual Activity  . Alcohol use: No  . Drug use: No  . Sexual  activity: Not on file  Lifestyle  . Physical activity:    Days per week: Not on file    Minutes per session: Not on file  . Stress: Not on file  Relationships  . Social connections:    Talks on phone: Not on file    Gets together: Not on file    Attends religious service: Not on file    Active member of club or organization: Not on file    Attends meetings of clubs or organizations: Not on file    Relationship status: Not on file  . Intimate partner violence:    Fear of current or ex partner: Not on file    Emotionally abused: Not on file    Physically abused: Not on file    Forced sexual activity: Not on file  Other Topics Concern  . Not on file  Social History Narrative    Household-- pt, wife and daughter   2 adult children      Allergies as of 05/24/2018   No Known Allergies     Medication List        Accurate as of 05/24/18 11:59 PM. Always use your most recent med list.          amLODipine 10 MG tablet Commonly known as:  NORVASC TAKE 1 TABLET  BY MOUTH EVERY DAY   aspirin EC 81 MG tablet Take 81 mg by mouth daily.   atorvastatin 10 MG tablet Commonly known as:  LIPITOR Take 1 tablet (10 mg total) by mouth daily.   azelastine 0.1 % nasal spray Commonly known as:  ASTELIN Place 2 sprays into both nostrils at bedtime as needed for rhinitis or allergies. Use in each nostril as directed   fluticasone 50 MCG/ACT nasal spray Commonly known as:  FLONASE Place 2 sprays into both nostrils daily as needed for allergies or rhinitis.   hydrocortisone 25 MG suppository Commonly known as:  ANUSOL-HC Place 1 suppository (25 mg total) rectally 2 (two) times daily as needed for hemorrhoids.   ibuprofen 800 MG tablet Commonly known as:  ADVIL,MOTRIN TAKE 1 TABLET (800 MG TOTAL) BY MOUTH DAILY AS NEEDED FOR PAIN. TAKE WITH FOOD.   loratadine 10 MG tablet Commonly known as:  CLARITIN Take 1 tablet (10 mg total) by mouth daily as needed for allergies.   losartan  100 MG tablet Commonly known as:  COZAAR Take 1 tablet (100 mg total) by mouth daily.   NON FORMULARY [facility]  Onychomycosis Nail Lacquer -  Fluconazole 2%, Terbinafine 1% DMSO Apply to affected nail once daily Qty. 120 gm 3 refills   nystatin-triamcinolone ointment Commonly known as:  MYCOLOG Apply 1 application topically 2 (two) times daily as needed. Use sparingly   PRESCRIPTION MEDICATION NAME - C ONYCHOMYCOSIS COMPIUND MEDICATION Apply to affected toes and nails daily as directed by MD.          Objective:   Physical Exam BP 126/68 (BP Location: Left Arm, Patient Position: Sitting, Cuff Size: Normal)   Pulse 71   Temp 98.1 F (36.7 C) (Oral)   Resp 14   Ht 5\' 11"  (1.803 m)   Wt 248 lb 8 oz (112.7 kg)   SpO2 98%   BMI 34.66 kg/m  General:   Well developed, NAD, see BMI.  HEENT:  Normocephalic . Face symmetric, atraumatic Lungs:  CTA B Normal respiratory effort, no intercostal retractions, no accessory muscle use. Heart: RRR,  no murmur.  No pretibial edema bilaterally  Skin: Not pale. Not jaundice Neurologic:  alert & oriented X3.  Speech normal, gait appropriate for age and unassisted Psych--  Cognition and judgment appear intact.  Cooperative with normal attention span and concentration.  Behavior appropriate. No anxious or depressed appearing.      Assessment & Plan:   Assessment   HTN Hyperlipidemia: Started Lipitor 10-18 DJD BPH last visit w/ urology 01-2016  Sarcoidosis, dx 1990s, Dr. Alva Garnet Eczema (L.E.) LLL PNM 06-2016 C/o CP: 06-2017: Abnormal stress test, EF decreased; 07-2017: Echo: wnl  EF, coronary CT: Mild disease. Saw cards.  Plan: CV RF control, LDL goal 70  PLAN: HTN: Seems controlled on amlodipine, losartan.  BMP and CBC Hyperlipidemia: Controlled. On Lipitor BPH: Asx, not needing tamsulosin. Preventive care:  Benefits of PNM and Prevnar discussed, strongly declined  Also, he is due for a DRE on next visit, he  already told me he will decline thus will proceed with PSA only. RTC 4 to 5 months CPX

## 2018-05-24 NOTE — Progress Notes (Signed)
Pre visit review using our clinic review tool, if applicable. No additional management support is needed unless otherwise documented below in the visit note. 

## 2018-05-25 ENCOUNTER — Encounter: Payer: Self-pay | Admitting: Internal Medicine

## 2018-05-25 DIAGNOSIS — N400 Enlarged prostate without lower urinary tract symptoms: Secondary | ICD-10-CM

## 2018-05-25 HISTORY — DX: Benign prostatic hyperplasia without lower urinary tract symptoms: N40.0

## 2018-05-25 NOTE — Assessment & Plan Note (Signed)
HTN: Seems controlled on amlodipine, losartan.  BMP and CBC Hyperlipidemia: Controlled. On Lipitor BPH: Asx, not needing tamsulosin. Preventive care:  Benefits of PNM and Prevnar discussed, strongly declined  Also, he is due for a DRE on next visit, he already told me he will decline thus will proceed with PSA only. RTC 4 to 5 months CPX

## 2018-06-01 ENCOUNTER — Ambulatory Visit (INDEPENDENT_AMBULATORY_CARE_PROVIDER_SITE_OTHER): Payer: Medicare Other | Admitting: Podiatry

## 2018-06-01 ENCOUNTER — Encounter: Payer: Self-pay | Admitting: Podiatry

## 2018-06-01 DIAGNOSIS — B351 Tinea unguium: Secondary | ICD-10-CM

## 2018-06-01 DIAGNOSIS — M79674 Pain in right toe(s): Secondary | ICD-10-CM

## 2018-06-01 DIAGNOSIS — Q828 Other specified congenital malformations of skin: Secondary | ICD-10-CM

## 2018-06-01 DIAGNOSIS — M79675 Pain in left toe(s): Secondary | ICD-10-CM

## 2018-06-07 NOTE — Progress Notes (Signed)
Subjective:  Denies any 67 year old male presents the office today with a painful callus on the left foot.  There is painful with pressure in shoes.  He also has some toenails are long and thick and he cannot trim himself near painful at times.  Denies any redness or drainage.  Systemic complaints such as fevers, chills, nausea, vomiting. No acute changes since last appointment, and no other complaints at this time.   Objective: AAO x3, NAD DP/PT pulses palpable bilaterally, CRT less than 3 seconds Keratotic lesion left foot submetatarsal 1.  Upon debridement there is no underlying ulceration, drainage or any signs of infection present today.  The nails on the second, third, fourth digit toenail hypertrophic, dystrophic with yellow discoloration and there is subjective tenderness to the area.  There is no surrounding redness or drainage or any signs of infection.  No open lesions or pre-ulcerative lesions.  No pain with calf compression, swelling, warmth, erythema  Assessment: 67 year old male with symptomatic hyperkeratotic lesion, onychomycosis  Plan: -All treatment options discussed with the patient including all alternatives, risks, complications.  -Hyperkeratotic lesion was sharply debrided x1 without any complications or bleeding. Offloading -Nails are sharply debrided x6 without any complications or bleeding -Patient encouraged to call the office with any questions, concerns, change in symptoms.   Trula Slade DPM

## 2018-06-14 ENCOUNTER — Other Ambulatory Visit: Payer: Self-pay | Admitting: Internal Medicine

## 2018-06-14 MED ORDER — NYSTATIN-TRIAMCINOLONE 100000-0.1 UNIT/GM-% EX OINT: 1.0000 "application " | TOPICAL_OINTMENT | Freq: Two times a day (BID) | CUTANEOUS | 1 refills | Status: DC | PRN

## 2018-06-14 NOTE — Telephone Encounter (Signed)
Rx sent 

## 2018-06-14 NOTE — Telephone Encounter (Signed)
LOV 05/24/18  Dr. Larose Kells

## 2018-06-14 NOTE — Telephone Encounter (Signed)
Copied from [place] [id number]. Topic: Quick Communication - Rx Refill/Question >> Jun 14, 2018  2:01 PM Sheran Luz wrote: Medication: nystatin-triamcinolone ointment (MYCOLOG)  Pt is requesting a refill of this medication. Pt stated that hydrocortisone 2.5 % cream is not working as well as the nystatin-triamcinolone ointment (MYCOLOG) and would like to switch back if possible. Please advise.    Preferred Pharmacy (with phone number or street name):  [id number] [address], [place]. [phone] (Phone) [phone] (Fax)

## 2018-07-14 ENCOUNTER — Telehealth: Payer: Self-pay | Admitting: Internal Medicine

## 2018-07-14 NOTE — Telephone Encounter (Signed)
sent 

## 2018-07-14 NOTE — Telephone Encounter (Signed)
Refill request for ibuprofen 800mg .   Last OV: 05/24/2018 Last Fill: 04/15/2018 #90 and 0RF   Okay to refill?

## 2018-08-01 ENCOUNTER — Other Ambulatory Visit: Payer: Self-pay | Admitting: Internal Medicine

## 2018-09-29 ENCOUNTER — Encounter: Payer: Self-pay | Admitting: Internal Medicine

## 2018-09-29 ENCOUNTER — Ambulatory Visit (INDEPENDENT_AMBULATORY_CARE_PROVIDER_SITE_OTHER): Payer: Medicare Other | Admitting: Internal Medicine

## 2018-09-29 VITALS — BP 128/74 | HR 61 | Temp 98.1°F | Resp 16 | Ht 71.0 in | Wt 252.4 lb

## 2018-09-29 DIAGNOSIS — E785 Hyperlipidemia, unspecified: Secondary | ICD-10-CM

## 2018-09-29 DIAGNOSIS — Z0000 Encounter for general adult medical examination without abnormal findings: Secondary | ICD-10-CM | POA: Diagnosis not present

## 2018-09-29 DIAGNOSIS — Z23 Encounter for immunization: Secondary | ICD-10-CM | POA: Diagnosis not present

## 2018-09-29 LAB — COMPREHENSIVE METABOLIC PANEL
ALBUMIN: 4.4 g/dL (ref 3.5–5.2)
ALK PHOS: 43 U/L (ref 39–117)
ALT: 26 U/L (ref 0–53)
AST: 19 U/L (ref 0–37)
BILIRUBIN TOTAL: 0.8 mg/dL (ref 0.2–1.2)
BUN: 19 mg/dL (ref 6–23)
CALCIUM: 9.7 mg/dL (ref 8.4–10.5)
CO2: 28 mEq/L (ref 19–32)
Chloride: 106 mEq/L (ref 96–112)
Creatinine, Ser: 1.28 mg/dL (ref 0.40–1.50)
GFR: 72.14 mL/min (ref >60.00)
GLUCOSE: 115 mg/dL — AB (ref 70–99)
POTASSIUM: 4.7 meq/L (ref 3.5–5.1)
Sodium: 142 mEq/L (ref 135–145)
TOTAL PROTEIN: 7.5 g/dL (ref 6.0–8.3)

## 2018-09-29 LAB — PSA: PSA: 0.4 ng/mL (ref 0.10–4.00)

## 2018-09-29 LAB — TSH: TSH: 1.47 u[IU]/mL (ref 0.35–4.50)

## 2018-09-29 LAB — LIPID PANEL
CHOLESTEROL: 224 mg/dL — AB (ref 0–200)
HDL: 71.1 mg/dL (ref >39.00)
LDL Cholesterol: 127 mg/dL — ABNORMAL HIGH (ref 0–99)
NONHDL: 152.76
Total CHOL/HDL Ratio: 3
Triglycerides: 129 mg/dL (ref 0.0–149.0)
VLDL: 25.8 mg/dL (ref 0.0–40.0)

## 2018-09-29 NOTE — Patient Instructions (Addendum)
Please schedule Medicare Wellness with Glenard Haring.    GO TO THE LAB : Get the blood work     GO TO THE FRONT DESK Schedule your next appointment for a  Check in 6-8 months    Check the  blood pressure 2 or 3 times a month   Be sure your blood pressure is between 110/65 and  135/85. If it is consistently higher or lower, let me know

## 2018-09-29 NOTE — Assessment & Plan Note (Signed)
-  Td  2011;declined  pnm shots; pro-cons discussed, accepted the flu shot  -CCS: Colonoscopy 2004 wnl Scope 02-2014, + Polyp, high-grade dysplasia Had a cscope ~ 03-2015, next per Dr. Michail Sermon (5 years per pt) -prostate cancer screening: Patient accepted to DRE today, normal, check a PSA -Labs: CMP FLP PSA TSH -Diet and exercise discussed

## 2018-09-29 NOTE — Progress Notes (Signed)
Subjective:    Patient ID: Levi Jones, male    DOB: 1951/01/02, 67 y.o.   MRN: [id number]  DOS:  09/29/2018 Type of visit - description : cpx  In general feels very well  Review of Systems Occasionally has difficulty urinating urgency but no dysuria or gross hematuria.  Symptoms are mild and infrequent. He still has occasional knee pain.  Takes ibuprofen sporadically. Not taking Lipitor  Other than above, a 14 point review of systems is negative     Past Medical History:  Diagnosis Date  . Abnormal LFTs 07/01/2016  . ALLERGIC RHINITIS CAUSE UNSPECIFIED 03/05/2010   Qualifier: Diagnosis of  By: Stann Mainland CMA, Alida    . Anemia 07/02/2016  . Benign localized hyperplasia of prostate with urinary obstruction   . BPH (benign prostatic hyperplasia) 05/25/2018  . CAP (community acquired pneumonia) 07/01/2016  . 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 Hypertension   . Nocturia   . Osteoarthritis   . Sarcoidosis    h/o, DX in the 90's to early 2000s (Dr.Simonds)  . Sciatica 09/27/2014  . Thyromegaly 03/28/2015    Past Surgical History:  Procedure Laterality Date  . FOOT SURGERY Left     remotely, at [facility], podiatry.  Marland Kitchen KNEE ARTHROSCOPY     Right x2  . KNEE ARTHROSCOPY     Left x1    Social History   Socioeconomic History  . Marital status: Married    Spouse name: Not on file  . Number of children: 2  . Years of education: Not on file  . Highest education level: Not on file  Occupational History  . Occupation: RETIRED 12-2016, works part time--[employer]    Employer: [employer]  . Financial resource strain: Not on file  . Food insecurity:    Worry: Not on file    Inability: Not on file  . Transportation needs:    Medical: Not on file    Non-medical: Not on file  Tobacco Use  . Smoking  status: Never Smoker  . Smokeless tobacco: Never Used  Substance and Sexual Activity  . Alcohol use: No  . Drug use: No  . Sexual activity: Not on file  Lifestyle  . Physical activity:    Days per week: Not on file    Minutes per session: Not on file  . Stress: Not on file  Relationships  . Social connections:    Talks on phone: Not on file    Gets together: Not on file    Attends religious service: Not on file    Active member of club or organization: Not on file    Attends meetings of clubs or organizations: Not on file    Relationship status: Not on file  . Intimate partner violence:    Fear of current or ex partner: Not on file    Emotionally abused: Not on file    Physically abused: Not on file    Forced sexual activity: Not on file  Other Topics Concern  . Not on file  Social History Narrative    Household-- pt, wife and daughter   2 adult children     Family History  Problem Relation Age of Onset  . Lung cancer Father        smoker  . Coronary artery disease Mother  stents at age 90  . Hypertension Mother   . Heart failure Mother   . Prostate cancer Neg Hx   . Colon cancer Neg Hx   . Stroke Neg Hx      Allergies as of 09/29/2018   No Known Allergies     Medication List        Accurate as of 09/29/18 11:59 PM. Always use your most recent med list.          amLODipine 10 MG tablet Commonly known as:  NORVASC Take 1 tablet (10 mg total) by mouth daily.   aspirin EC 81 MG tablet Take 81 mg by mouth daily.   azelastine 0.1 % nasal spray Commonly known as:  ASTELIN Place 2 sprays into both nostrils at bedtime as needed for rhinitis or allergies. Use in each nostril as directed   fluticasone 50 MCG/ACT nasal spray Commonly known as:  FLONASE Place 2 sprays into both nostrils daily as needed for allergies or rhinitis.   hydrocortisone 2.5 % cream APPLY TOPICALLY 2 (TWO) TIMES DAILY AS NEEDED.   hydrocortisone 25 MG suppository Commonly  known as:  ANUSOL-HC Place 1 suppository (25 mg total) rectally 2 (two) times daily as needed for hemorrhoids.   ibuprofen 800 MG tablet Commonly known as:  ADVIL,MOTRIN TAKE 1 TABLET (800 MG TOTAL) BY MOUTH DAILY AS NEEDED FOR PAIN. TAKE WITH FOOD.   loratadine 10 MG tablet Commonly known as:  CLARITIN Take 1 tablet (10 mg total) by mouth daily as needed for allergies.   losartan 100 MG tablet Commonly known as:  COZAAR Take 1 tablet (100 mg total) by mouth daily.   NON FORMULARY [facility]  Onychomycosis Nail Lacquer -  Fluconazole 2%, Terbinafine 1% DMSO Apply to affected nail once daily Qty. 120 gm 3 refills   PRESCRIPTION MEDICATION NAME - C ONYCHOMYCOSIS COMPIUND MEDICATION Apply to affected toes and nails daily as directed by MD.           Objective:   Physical Exam BP 128/74 (BP Location: Left Arm, Patient Position: Sitting, Cuff Size: Normal)   Pulse 61   Temp 98.1 F (36.7 C) (Oral)   Resp 16   Ht 5\' 11"  (1.803 m)   Wt 252 lb 6 oz (114.5 kg)   SpO2 96%   BMI 35.20 kg/m  General: Well developed, NAD, BMI noted Neck: No  thyromegaly  HEENT:  Normocephalic . Face symmetric, atraumatic Lungs:  CTA B Normal respiratory effort, no intercostal retractions, no accessory muscle use. Heart: RRR,  no murmur.  No pretibial edema bilaterally  Abdomen:  Not distended, soft, non-tender. No rebound or rigidity.   Skin: Exposed areas without rash. Not pale. Not jaundice Rectal: External abnormalities: none. Normal sphincter tone. No rectal masses or tenderness.  Brown stools Prostate: Prostate gland firm and smooth.  Exam was somewhat limited but prostate gland felt normal. Neurologic:  alert & oriented X3.  Speech normal, gait appropriate for age and unassisted Strength symmetric and appropriate for age.  Psych: Cognition and judgment appear intact.  Cooperative with normal attention span and concentration.  Behavior appropriate. No anxious or  depressed appearing.     Assessment & Plan:   Assessment   HTN Hyperlipidemia: Started Lipitor 10-18 DJD BPH last visit w/ urology 01-2016  Sarcoidosis, dx 1990s, Dr. Alva Garnet MSK: knee pain, DJD Eczema (L.E.) LLL PNM 06-2016 C/o CP: 06-2017: Abnormal stress test, EF decreased; 07-2017: Echo: wnl  EF, coronary CT: Mild disease. Saw cards.  Plan: CV RF control, LDL goal 70  PLAN: HTN: Good compliance to medication, BP today is very good. High cholesterol, self stopped Lipitor several months ago, no particular reason, "I just do not like to take too much medication".  We are checking a FLP but he is inclined to discuss the results (and possible medication) with Dr. Tamala Julian. DJD, knee pain: he remains active, plays basketball, takes ibuprofen now and then.  RTC 6-8 m

## 2018-09-29 NOTE — Progress Notes (Signed)
Pre visit review using our clinic review tool, if applicable. No additional management support is needed unless otherwise documented below in the visit note. 

## 2018-09-30 NOTE — Assessment & Plan Note (Signed)
HTN: Good compliance to medication, BP today is very good. High cholesterol, self stopped Lipitor several months ago, no particular reason, "I just do not like to take too much medication".  We are checking a FLP but he is inclined to discuss the results (and possible medication) with Dr. Tamala Julian. DJD, knee pain: he remains active, plays basketball, takes ibuprofen now and then.  RTC 6-8 m

## 2018-10-13 ENCOUNTER — Other Ambulatory Visit: Payer: Self-pay | Admitting: Interventional Cardiology

## 2018-10-13 NOTE — Telephone Encounter (Signed)
Pt's pharmacy is requesting a refill on atorvastatin. This medication is no longer on pt's medication list. Would Dr. Tamala Julian like to prescribe this medication? Please address

## 2018-10-15 NOTE — Telephone Encounter (Signed)
Med should be refilled. Needs liver and lipid 1 year after prior labs.

## 2018-10-28 ENCOUNTER — Other Ambulatory Visit: Payer: Self-pay | Admitting: Interventional Cardiology

## 2018-10-29 MED ORDER — ATORVASTATIN CALCIUM 10 MG PO TABS: 10.0000 mg | ORAL_TABLET | Freq: Every day | ORAL | 1 refills | Status: DC

## 2018-10-29 NOTE — Addendum Note (Signed)
Addended by: Derl Barrow on: 10/29/2018 12:43 PM   Modules accepted: Orders

## 2018-12-11 ENCOUNTER — Other Ambulatory Visit: Payer: Self-pay | Admitting: Internal Medicine

## 2018-12-14 ENCOUNTER — Other Ambulatory Visit (HOSPITAL_COMMUNITY)
Admission: RE | Admit: 2018-12-14 | Discharge: 2018-12-14 | Disposition: A | Discharge status: Home / Self Care | Payer: Medicare Other | Source: Ambulatory Visit | Attending: Internal Medicine | Admitting: Internal Medicine

## 2018-12-14 ENCOUNTER — Encounter: Payer: Self-pay | Admitting: Internal Medicine

## 2018-12-14 ENCOUNTER — Ambulatory Visit (INDEPENDENT_AMBULATORY_CARE_PROVIDER_SITE_OTHER): Payer: Medicare Other | Admitting: Internal Medicine

## 2018-12-14 VITALS — BP 122/82 | HR 77 | Ht 71.0 in | Wt 255.0 lb

## 2018-12-14 DIAGNOSIS — Z202 Contact with and (suspected) exposure to infections with a predominantly sexual mode of transmission: Secondary | ICD-10-CM | POA: Insufficient documentation

## 2018-12-14 LAB — URINALYSIS, ROUTINE W REFLEX MICROSCOPIC
HGB URINE DIPSTICK: NEGATIVE
Leukocytes,Ua: NEGATIVE
NITRITE: NEGATIVE
RBC / HPF: NONE SEEN (ref 0–?)
Specific Gravity, Urine: 1.025 (ref 1.000–1.030)
Total Protein, Urine: NEGATIVE
URINE GLUCOSE: NEGATIVE
Urobilinogen, UA: 0.2 (ref 0.0–1.0)
pH: 5.5 (ref 5.0–8.0)

## 2018-12-14 NOTE — Assessment & Plan Note (Signed)
Possible STD exposure First of all recommend safe sex.  Wife needs to be checked. Recommend a UA, urine for gonorrhea and chlamydia, HIV and RPR. Patient is concerned about Ureaplasma infection, he has no symptoms, if he develops symptoms consider specific culture for that organism.

## 2018-12-14 NOTE — Progress Notes (Signed)
Subjective:    Patient ID: Levi Jones, male    DOB: 1951/06/11, 68 y.o.   MRN: [id number]  DOS:  12/14/2018 Type of visit - description: acute Patient reports a history of recent unprotected sex. Request checkup.  Review of Systems Denies dysuria, gross hematuria. No penile discharge or ulcers.  Past Medical History:  Diagnosis Date  . Abnormal LFTs 07/01/2016  . ALLERGIC RHINITIS CAUSE UNSPECIFIED 03/05/2010   Qualifier: Diagnosis of  By: Stann Mainland CMA, Alida    . Anemia 07/02/2016  . Benign localized hyperplasia of prostate with urinary obstruction   . BPH (benign prostatic hyperplasia) 05/25/2018  . CAP (community acquired pneumonia) 07/01/2016  . 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 Hypertension   . Nocturia   . Osteoarthritis   . Sarcoidosis    h/o, DX in the 90's to early 2000s (Dr.Simonds)  . Sciatica 09/27/2014  . Thyromegaly 03/28/2015    Past Surgical History:  Procedure Laterality Date  . FOOT SURGERY Left     remotely, at [facility], podiatry.  Marland Kitchen KNEE ARTHROSCOPY     Right x2  . KNEE ARTHROSCOPY     Left x1    Social History   Socioeconomic History  . Marital status: Married    Spouse name: Not on file  . Number of children: 2  . Years of education: Not on file  . Highest education level: Not on file  Occupational History  . Occupation: RETIRED 12-2016, works part time--[employer]    Employer: [employer]  . Financial resource strain: Not on file  . Food insecurity:    Worry: Not on file    Inability: Not on file  . Transportation needs:    Medical: Not on file    Non-medical: Not on file  Tobacco Use  . Smoking status: Never Smoker  . Smokeless tobacco: Never Used  Substance and Sexual Activity  . Alcohol use: No  . Drug use: No  . Sexual activity: Not on file    Lifestyle  . Physical activity:    Days per week: Not on file    Minutes per session: Not on file  . Stress: Not on file  Relationships  . Social connections:    Talks on phone: Not on file    Gets together: Not on file    Attends religious service: Not on file    Active member of club or organization: Not on file    Attends meetings of clubs or organizations: Not on file    Relationship status: Not on file  . Intimate partner violence:    Fear of current or ex partner: Not on file    Emotionally abused: Not on file    Physically abused: Not on file    Forced sexual activity: Not on file  Other Topics Concern  . Not on file  Social History Narrative    Household-- pt, wife and daughter   2 adult children      Allergies as of 12/14/2018   No Known Allergies     Medication List       Accurate as of December 14, 2018  8:02 AM. Always use your most recent med list.        amLODipine 10 MG tablet Commonly known as:  NORVASC Take 1 tablet (10 mg  total) by mouth daily.   aspirin EC 81 MG tablet Take 81 mg by mouth daily.   atorvastatin 10 MG tablet Commonly known as:  LIPITOR Take 1 tablet (10 mg total) by mouth daily.   azelastine 0.1 % nasal spray Commonly known as:  ASTELIN Place 2 sprays into both nostrils at bedtime as needed for rhinitis or allergies. Use in each nostril as directed   fluticasone 50 MCG/ACT nasal spray Commonly known as:  FLONASE Place 2 sprays into both nostrils daily as needed for allergies or rhinitis.   hydrocortisone 2.5 % cream APPLY TOPICALLY 2 (TWO) TIMES DAILY AS NEEDED.   hydrocortisone 25 MG suppository Commonly known as:  ANUSOL-HC Place 1 suppository (25 mg total) rectally 2 (two) times daily as needed for hemorrhoids.   ibuprofen 800 MG tablet Commonly known as:  ADVIL,MOTRIN TAKE 1 TABLET (800 MG TOTAL) BY MOUTH DAILY AS NEEDED FOR PAIN. TAKE WITH FOOD.   loratadine 10 MG tablet Commonly known as:  CLARITIN Take 1  tablet (10 mg total) by mouth daily as needed for allergies.   losartan 100 MG tablet Commonly known as:  COZAAR Take 1 tablet (100 mg total) by mouth daily.   NON FORMULARY [facility]  Onychomycosis Nail Lacquer -  Fluconazole 2%, Terbinafine 1% DMSO Apply to affected nail once daily Qty. 120 gm 3 refills   PRESCRIPTION MEDICATION NAME - C ONYCHOMYCOSIS COMPIUND MEDICATION Apply to affected toes and nails daily as directed by MD.           Objective:   Physical Exam BP 122/82   Pulse 77   Ht 5\' 11"  (1.803 m)   Wt 255 lb (115.7 kg)   SpO2 97%   BMI 35.57 kg/m  General:   Well developed, NAD, BMI noted. HEENT:  Normocephalic . Face symmetric, atraumatic GU: Scrotal contents normal, penis without lesions or discharge Skin: Not pale. Not jaundice Neurologic:  alert & oriented X3.  Speech normal, gait appropriate for age and unassisted Psych--  Cognition and judgment appear intact.  Cooperative with normal attention span and concentration.  Behavior appropriate. No anxious or depressed appearing.      Assessment     Assessment   HTN Hyperlipidemia: Started Lipitor 10-18 DJD BPH last visit w/ urology 01-2016  Sarcoidosis, dx 1990s, Dr. Alva Garnet MSK: knee pain, DJD Eczema (L.E.) LLL PNM 06-2016 C/o CP: 06-2017: Abnormal stress test, EF decreased; 07-2017: Echo: wnl  EF, coronary CT: Mild disease. Saw cards.  Plan: CV RF control, LDL goal 70  PLAN: Possible STD exposure First of all recommend safe sex.  Wife needs to be checked. Recommend a UA, urine for gonorrhea and chlamydia, HIV and RPR. Patient is concerned about Ureaplasma infection, he has no symptoms, if he develops symptoms consider specific culture for that organism.

## 2018-12-14 NOTE — Patient Instructions (Signed)
GO TO THE LAB : Get the blood work     Safe Sex Practicing safe sex means taking steps before and during sex to reduce your risk of:  Getting an STD (sexually transmitted disease).  Giving your partner an STD.  Unwanted pregnancy. How can I practice safe sex? To practice safe sex:  Limit your sexual partners to only one partner who is having sex with only you.  Avoid using alcohol and recreational drugs before having sex. These substances can affect your judgment.  Before having sex with a new partner: ? Talk to your partner about past partners, past STDs, and drug use. ? You and your partner should be screened for STDs and discuss the results with each other.  Check your body regularly for sores, blisters, rashes, or unusual discharge. If you notice any of these problems, visit your health care provider.  If you have symptoms of an infection or you are being treated for an STD, avoid sexual contact.  While having sex, use a condom. Make sure to: ? Use a condom every time you have vaginal, oral, or anal sex. Both females and males should wear condoms during oral sex. ? Keep condoms in place from the beginning to the end of sexual activity. ? Use a latex condom, if possible. Latex condoms offer the best protection. ? Use only water-based lubricants or oils to lubricate a condom. Using petroleum-based lubricants or oils will weaken the condom and increase the chance that it will break.  See your health care provider for regular screenings, exams, and tests for STDs.  Talk with your health care provider about the form of birth control (contraception) that is best for you.  Get vaccinated against hepatitis B and human papillomavirus (HPV).  If you are at risk of being infected with HIV (human immunodeficiency virus), talk with your health care provider about taking a prescription medicine to prevent HIV infection. You are considered at risk for HIV if: ? You are a man who has sex  with other men. ? You are a heterosexual man or woman who is sexually active with more than one partner. ? You take drugs by injection. ? You are sexually active with a partner who has HIV. This information is not intended to replace advice given to you by your health care provider. Make sure you discuss any questions you have with your health care provider. Document Released: 11/20/2004 Document Revised: 02/27/2016 Document Reviewed: 09/02/2015 Elsevier Interactive Patient Education  2019 [employer].

## 2018-12-15 LAB — URINE CYTOLOGY ANCILLARY ONLY
Chlamydia: NEGATIVE
Neisseria Gonorrhea: NEGATIVE
Trichomonas: NEGATIVE

## 2018-12-15 LAB — RPR: RPR Ser Ql: NONREACTIVE

## 2018-12-15 LAB — HIV ANTIBODY (ROUTINE TESTING W REFLEX): HIV 1&2 Ab, 4th Generation: NONREACTIVE

## 2018-12-29 NOTE — Progress Notes (Deleted)
Subjective:   Levi Jones is a 68 y.o. male who presents for Medicare Annual (Subsequent) preventive examination.  Review of Systems: No ROS.  Medicare Wellness Visit. Additional risk factors are reflected in the social history.   Sleep patterns:  Home Safety/Smoke Alarms: Feels safe in home. Smoke alarms in place.    Male:   CCS- last reported 03/2015 w/ 5 yr recall     PSA-  Lab Results  Component Value Date   PSA 0.40 09/29/2018   PSA 0.23 06/27/2016   PSA 0.27 12/25/2015       Objective:     Vitals: There were no vitals taken for this visit.  There is no height or weight on file to calculate BMI.  Advanced Directives 07/01/2016 07/01/2016 06/25/2016  Does Patient Have a Medical Advance Directive? No No No  Would patient like information on creating a medical advance directive? No - patient declined information No - patient declined information -    Tobacco Social History   Tobacco Use  Smoking Status Never Smoker  Smokeless Tobacco Never Used     Counseling given: Not Answered   Clinical Intake:                       Past Medical History:  Diagnosis Date  . Abnormal LFTs 07/01/2016  . ALLERGIC RHINITIS CAUSE UNSPECIFIED 03/05/2010   Qualifier: Diagnosis of  By: Stann Mainland CMA, [place]    . Anemia 07/02/2016  . Benign localized hyperplasia of prostate with urinary obstruction   . BPH (benign prostatic hyperplasia) 05/25/2018  . CAP (community acquired pneumonia) 07/01/2016  . 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 Hypertension   . Nocturia   . Osteoarthritis   . Sarcoidosis    h/o, DX in the 90's to early 2000s (Dr.Simonds)  . Sciatica 09/27/2014  . Thyromegaly 03/28/2015   Past Surgical History:  Procedure Laterality Date  . FOOT SURGERY Left     remotely, at [facility], podiatry.  Marland Kitchen  KNEE ARTHROSCOPY     Right x2  . KNEE ARTHROSCOPY     Left x1   Family History  Problem Relation Age of Onset  . Lung cancer Father        smoker  . Coronary artery disease Mother        stents at age 25  . Hypertension Mother   . Heart failure Mother   . Prostate cancer Neg Hx   . Colon cancer Neg Hx   . Stroke Neg Hx    Social History   Socioeconomic History  . Marital status: Married    Spouse name: Not on file  . Number of children: 2  . Years of education: Not on file  . Highest education level: Not on file  Occupational History  . 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 Financial resource strain: Not on file  . Food insecurity:    Worry: Not on file    Inability: Not on file  . Transportation needs:    Medical: Not on file    Non-medical: Not on file  Tobacco Use  . Smoking status: Never Smoker  . Smokeless tobacco: Never Used  Substance and Sexual Activity  . Alcohol use: No  . Drug use:  No  . Sexual activity: Not on file  Lifestyle  . Physical activity:    Days per week: Not on file    Minutes per session: Not on file  . Stress: Not on file  Relationships  . Social connections:    Talks on phone: Not on file    Gets together: Not on file    Attends religious service: Not on file    Active member of club or organization: Not on file    Attends meetings of clubs or organizations: Not on file    Relationship status: Not on file  Other Topics Concern  . Not on file  Social History Narrative    Household-- pt, wife and daughter   2 adult children    Outpatient Encounter Medications as of 12/30/2018  Medication Sig  . amLODipine (NORVASC) 10 MG tablet Take 1 tablet (10 mg total) by mouth daily.  Marland Kitchen aspirin EC 81 MG tablet Take 81 mg by mouth daily.  Marland Kitchen atorvastatin (LIPITOR) 10 MG tablet Take 1 tablet (10 mg total) by mouth daily.  Marland Kitchen azelastine (ASTELIN) 0.1 % nasal spray Place 2 sprays  into both nostrils at bedtime as needed for rhinitis or allergies. Use in each nostril as directed  . fluticasone (FLONASE) 50 MCG/ACT nasal spray Place 2 sprays into both nostrils daily as needed for allergies or rhinitis.  . hydrocortisone (ANUSOL-HC) 25 MG suppository Place 1 suppository (25 mg total) rectally 2 (two) times daily as needed for hemorrhoids.  . hydrocortisone 2.5 % cream APPLY TOPICALLY 2 (TWO) TIMES DAILY AS NEEDED.  Marland Kitchen ibuprofen (ADVIL,MOTRIN) 800 MG tablet TAKE 1 TABLET (800 MG TOTAL) BY MOUTH DAILY AS NEEDED FOR PAIN. TAKE WITH FOOD.  Marland Kitchen loratadine (CLARITIN) 10 MG tablet Take 1 tablet (10 mg total) by mouth daily as needed for allergies.  Marland Kitchen losartan (COZAAR) 100 MG tablet Take 1 tablet (100 mg total) by mouth daily.  Salley Scarlet [facility]  Onychomycosis Nail Lacquer -  Fluconazole 2%, Terbinafine 1% DMSO Apply to affected nail once daily Qty. 120 gm 3 refills  . PRESCRIPTION MEDICATION NAME - C ONYCHOMYCOSIS COMPIUND MEDICATION Apply to affected toes and nails daily as directed by MD.   No facility-administered encounter medications on file as of 12/30/2018.     Activities of Daily Living In your present state of health, do you have any difficulty performing the following activities: 05/24/2018  Hearing? N  Vision? N  Difficulty concentrating or making decisions? N  Walking or climbing stairs? N  Dressing or bathing? N  Doing errands, shopping? N  Some recent data might be hidden    Patient Care Team: Colon Branch, MD as PCP - General Wilford Corner, MD as Consulting Physician (Gastroenterology) Carolan Clines, MD (Inactive) as Consulting Physician (Urology)    Assessment:   This is a routine wellness examination for [place]. Physical assessment deferred to PCP.  Exercise Activities and Dietary recommendations   Diet (meal preparation, eat out, water intake, caffeinated beverages, dairy products, fruits and vegetables): {Desc;  diets:16563} Breakfast: Lunch:  Dinner:      Goals   None     Fall Risk Fall Risk  05/24/2018 03/04/2017  Falls in the past year? No No    Depression Screen PHQ 2/9 Scores 09/29/2018 05/24/2018 03/04/2017  PHQ - 2 Score 2 0 0  PHQ- 9 Score 5 - -     Cognitive Function        Immunization History  Administered Date(s) Administered  .  Hepatitis B 08/09/1997, 02/20/1998, 04/17/1998  . Influenza, High Dose Seasonal PF 09/29/2018  . Td 01/02/2010   Screening Tests Health Maintenance  Topic Date Due  . PNA vac Low Risk Adult (1 of 2 - PCV13) 09/30/2019 (Originally 12/23/2016)  . TETANUS/TDAP  01/03/2020  . COLONOSCOPY  04/08/2020  . INFLUENZA VACCINE  Completed  . Hepatitis C Screening  Completed    Plan:   ***   I have personally reviewed and noted the following in the patient's chart:   . Medical and social history . Use of alcohol, tobacco or illicit drugs  . Current medications and supplements . Functional ability and status . Nutritional status . Physical activity . Advanced directives . List of other physicians . Hospitalizations, surgeries, and ER visits in previous 12 months . Vitals . Screenings to include cognitive, depression, and falls . Referrals and appointments  In addition, I have reviewed and discussed with patient certain preventive protocols, quality metrics, and best practice recommendations. A written personalized care plan for preventive services as well as general preventive health recommendations were provided to patient.     Shela Nevin, [place]  12/29/2018

## 2018-12-30 ENCOUNTER — Ambulatory Visit: Payer: Medicare Other | Admitting: *Deleted

## 2019-01-03 ENCOUNTER — Ambulatory Visit (INDEPENDENT_AMBULATORY_CARE_PROVIDER_SITE_OTHER): Payer: Medicare Other | Admitting: *Deleted

## 2019-01-03 ENCOUNTER — Encounter: Payer: Self-pay | Admitting: *Deleted

## 2019-01-03 VITALS — BP 120/64 | HR 72 | Ht 71.0 in | Wt 256.4 lb

## 2019-01-03 DIAGNOSIS — Z0000 Encounter for general adult medical examination without abnormal findings: Secondary | ICD-10-CM | POA: Diagnosis not present

## 2019-01-03 NOTE — Progress Notes (Addendum)
Subjective:   Levi Jones is a 68 y.o. male who presents for an Initial Medicare Annual Wellness Visit.  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  The Patient was informed that the wellness visit is to identify future health risk and educate and initiate measures that can reduce risk for increased disease through the lifespan.   Describes health as fair, good or great? good   Review of Systems No ROS.  Medicare Wellness Visit. Additional risk factors are reflected in the social history. Cardiac Risk Factors include: advanced age (>11men, >50 women);dyslipidemia;hypertension;male gender Sleep patterns: no issues Home Safety/Smoke Alarms: Feels safe in home. Smoke alarms in place.  Lives in one story home with wife. Step over tub.   Male:   CCS- last reported 04/09/15. 5 yr recall PSA-  Lab Results  Component Value Date   PSA 0.40 09/29/2018   PSA 0.23 06/27/2016   PSA 0.27 12/25/2015      Objective:    Today's Vitals   01/03/19 1021  BP: 120/64  Pulse: 72  SpO2: 97%  Weight: 256 lb 6.4 oz (116.3 kg)  Height: 5\' 11"  (1.803 m)   Body mass index is 35.76 kg/m.  Advanced Directives 01/03/2019 07/01/2016 07/01/2016 06/25/2016  Does Patient Have a Medical Advance Directive? No No No No  Would patient like information on creating a medical advance directive? No - Patient declined No - patient declined information No - patient declined information -    Current Medications (verified) Outpatient Encounter Medications as of 01/03/2019  Medication Sig  . amLODipine (NORVASC) 10 MG tablet Take 1 tablet (10 mg total) by mouth daily.  Marland Kitchen aspirin EC 81 MG tablet Take 81 mg by mouth daily.  Marland Kitchen atorvastatin (LIPITOR) 10 MG tablet Take 1 tablet (10 mg total) by mouth daily.  Marland Kitchen azelastine (ASTELIN) 0.1 % nasal spray Place 2 sprays into both nostrils at bedtime as needed for rhinitis or allergies. Use in each nostril as directed  . fluticasone (FLONASE) 50  MCG/ACT nasal spray Place 2 sprays into both nostrils daily as needed for allergies or rhinitis.  . hydrocortisone (ANUSOL-HC) 25 MG suppository Place 1 suppository (25 mg total) rectally 2 (two) times daily as needed for hemorrhoids.  . hydrocortisone 2.5 % cream APPLY TOPICALLY 2 (TWO) TIMES DAILY AS NEEDED.  Marland Kitchen ibuprofen (ADVIL,MOTRIN) 800 MG tablet TAKE 1 TABLET (800 MG TOTAL) BY MOUTH DAILY AS NEEDED FOR PAIN. TAKE WITH FOOD.  Marland Kitchen loratadine (CLARITIN) 10 MG tablet Take 1 tablet (10 mg total) by mouth daily as needed for allergies.  Marland Kitchen losartan (COZAAR) 100 MG tablet Take 1 tablet (100 mg total) by mouth daily.  Salley Scarlet [facility]  Onychomycosis Nail Lacquer -  Fluconazole 2%, Terbinafine 1% DMSO Apply to affected nail once daily Qty. 120 gm 3 refills  . PRESCRIPTION MEDICATION NAME - C ONYCHOMYCOSIS COMPIUND MEDICATION Apply to affected toes and nails daily as directed by MD.   No facility-administered encounter medications on file as of 01/03/2019.     Allergies (verified) Patient has no known allergies.   History: Past Medical History:  Diagnosis Date  . Abnormal LFTs 07/01/2016  . ALLERGIC RHINITIS CAUSE UNSPECIFIED 03/05/2010   Qualifier: Diagnosis of  By: Stann Mainland CMA, Alida    . Anemia 07/02/2016  . Benign localized hyperplasia of prostate with urinary obstruction   . BPH (benign prostatic hyperplasia) 05/25/2018  . CAP (community acquired pneumonia) 07/01/2016  . Cervical disc disorder with radiculopathy of  cervical region 02/28/2014  . 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 Hypertension   . Nocturia   . Osteoarthritis   . Sarcoidosis    h/o, DX in the 90's to early 2000s (Dr.Simonds)  . Sciatica 09/27/2014  . Thyromegaly 03/28/2015   Past Surgical History:  Procedure Laterality Date  . FOOT SURGERY Left     remotely, at [facility], podiatry.  Marland Kitchen KNEE ARTHROSCOPY     Right x2    . KNEE ARTHROSCOPY     Left x1   Family History  Problem Relation Age of Onset  . Lung cancer Father        smoker  . Coronary artery disease Mother        stents at age 71  . Hypertension Mother   . Heart failure Mother   . Prostate cancer Neg Hx   . Colon cancer Neg Hx   . Stroke Neg Hx    Social History   Socioeconomic History  . Marital status: Married    Spouse name: Not on file  . Number of children: 2  . Years of education: Not on file  . Highest education level: Not on file  Occupational History  . Occupation: RETIRED 12-2016, works part time--[employer]IFF DE[employer]    Employer: [employer]  . Financial resource strain: Not on file  . Food insecurity:    Worry: Not on file    Inability: Not on file  . Transportation needs:    Medical: Not on file    Non-medical: Not on file  Tobacco Use  . Smoking status: Never Smoker  . Smokeless tobacco: Never Used  Substance and Sexual Activity  . Alcohol use: No  . Drug use: No  . Sexual activity: Yes  Lifestyle  . Physical activity:    Days per week: Not on file    Minutes per session: Not on file  . Stress: Not on file  Relationships  . Social connections:    Talks on phone: Not on file    Gets together: Not on file    Attends religious service: Not on file    Active member of club or organization: Not on file    Attends meetings of clubs or organizations: Not on file    Relationship status: Not on file  Other Topics Concern  . Not on file  Social History Narrative    Household-- pt, wife and daughter   2 adult children   Tobacco Counseling Counseling given: Not Answered   Clinical Intake:     Pain : No/denies pain                 Activities of Daily Living In your present state of health, do you have any difficulty performing the following activities: 01/03/2019 05/24/2018  Hearing? N N  Vision? N N  Comment wears readers. -  Difficulty concentrating or making decisions? N N   Walking or climbing stairs? N N  Dressing or bathing? N N  Doing errands, shopping? N N  Preparing Food and eating ? N -  Using the Toilet? N -  In the past six months, have you accidently leaked urine? N -  Do you have problems with loss of bowel control? N -  Managing your Medications? N -  Managing your Finances? N -  Housekeeping or managing your Housekeeping? N -  Some recent data  might be hidden     Immunizations and Health Maintenance Immunization History  Administered Date(s) Administered  . Hepatitis B 08/09/1997, 02/20/1998, 04/17/1998  . Influenza, High Dose Seasonal PF 09/29/2018  . Td 01/02/2010   There are no preventive care reminders to display for this patient.  Patient Care Team: Colon Branch, MD as PCP - Knute Neu, MD as Consulting Physician (Gastroenterology) Carolan Clines, MD (Inactive) as Consulting Physician (Urology)  Indicate any recent Medical Services you may have received from other than [facility] providers in the past year (date may be approximate).    Assessment:   This is a routine wellness examination for [place]. Physical assessment deferred to PCP.  Hearing/Vision screen  Hearing Screening   125Hz  250Hz  500Hz  1000Hz  2000Hz  3000Hz  4000Hz  6000Hz  8000Hz   Right ear:   Pass Pass Pass  Pass    Left ear:   Pass Pass Pass  Pass      Visual Acuity Screening   Right eye Left eye Both eyes  Without correction: 20/50 20/50 20/50   With correction:       Dietary issues and exercise activities discussed: Current Exercise Habits: The patient does not participate in regular exercise at present, Exercise limited by: None identified Diet (meal preparation, eat out, water intake, caffeinated beverages, dairy products, fruits and vegetables): 24 hr recall Breakfast: eggs, bacon, coffee, juice Lunch: BBQ sandwich and kale. Ginger ale. Dinner: pasta and fried chicken,water.     Goals    . DIET - EAT MORE FRUITS AND VEGETABLES    .  Increase physical activity      Depression Screen PHQ 2/9 Scores 01/03/2019 09/29/2018 05/24/2018 03/04/2017  PHQ - 2 Score 0 2 0 0  PHQ- 9 Score - 5 - -    Fall Risk Fall Risk  01/03/2019 05/24/2018 03/04/2017  Falls in the past year? 0 No No   Cognitive Function: MMSE - Mini Mental State Exam 01/03/2019  Orientation to time 5  Orientation to Place 5  Registration 3  Attention/ Calculation 5  Recall 3  Language- name 2 objects 2  Language- repeat 1  Language- follow 3 step command 3  Language- read & follow direction 1  Write a sentence 1  Copy design 1  Total score 30        Screening Tests Health Maintenance  Topic Date Due  . PNA vac Low Risk Adult (1 of 2 - PCV13) 09/30/2019 (Originally 12/23/2016)  . TETANUS/TDAP  01/03/2020  . COLONOSCOPY  04/08/2020  . INFLUENZA VACCINE  Completed  . Hepatitis C Screening  Completed      Plan:    Please schedule your next medicare wellness visit with me in 1 yr.  Eat heart healthy diet (full of fruits, vegetables, whole grains, lean protein, water--limit salt, fat, and sugar intake) and increase physical activity as tolerated.  Continue doing brain stimulating activities (puzzles, reading, adult coloring books, staying active) to keep memory sharp.    I have personally reviewed and noted the following in the patient's chart:   . Medical and social history . Use of alcohol, tobacco or illicit drugs  . Current medications and supplements . Functional ability and status . Nutritional status . Physical activity . Advanced directives . List of other physicians . Hospitalizations, surgeries, and ER visits in previous 12 months . Vitals . Screenings to include cognitive, depression, and falls . Referrals and appointments  In addition, I have reviewed and discussed with patient certain preventive protocols, quality metrics, and  best practice recommendations. A written personalized care plan for preventive services as well as general  preventive health recommendations were provided to patient.     Levi Jones, [place]   01/03/2019   Kathlene November, MD

## 2019-01-03 NOTE — Patient Instructions (Signed)
Please schedule your next medicare wellness visit with me in 1 yr.  Eat heart healthy diet (full of fruits, vegetables, whole grains, lean protein, water--limit salt, fat, and sugar intake) and increase physical activity as tolerated.  Continue doing brain stimulating activities (puzzles, reading, adult coloring books, staying active) to keep memory sharp.    Levi Jones , Thank you for taking time to come for your Medicare Wellness Visit. I appreciate your ongoing commitment to your health goals. Please review the following plan we discussed and let me know if I can assist you in the future.   These are the goals we discussed: Goals    . DIET - EAT MORE FRUITS AND VEGETABLES    . Increase physical activity       This is a list of the screening recommended for you and due dates:  Health Maintenance  Topic Date Due  . Pneumonia vaccines (1 of 2 - PCV13) 09/30/2019*  . Tetanus Vaccine  01/03/2020  . Colon Cancer Screening  04/08/2020  . Flu Shot  Completed  .  Hepatitis C: One time screening is recommended by Center for Disease Control  (CDC) for  adults born from [birth year] through 1964.   Completed  *Topic was postponed. The date shown is not the original due date.    Health Maintenance After Age 56 After age 87, you are at a higher risk for certain long-term diseases and infections as well as injuries from falls. Falls are a major cause of broken bones and head injuries in people who are older than age 47. Getting regular preventive care can help to keep you healthy and well. Preventive care includes getting regular testing and making lifestyle changes as recommended by your health care provider. Talk with your health care provider about:  Which screenings and tests you should have. A screening is a test that checks for a disease when you have no symptoms.  A diet and exercise plan that is right for you. What should I know about screenings and tests to prevent falls? Screening and testing  are the best ways to find a health problem early. Early diagnosis and treatment give you the best chance of managing medical conditions that are common after age 40. Certain conditions and lifestyle choices may make you more likely to have a fall. Your health care provider may recommend:  Regular vision checks. Poor vision and conditions such as cataracts can make you more likely to have a fall. If you wear glasses, make sure to get your prescription updated if your vision changes.  Medicine review. Work with your health care provider to regularly review all of the medicines you are taking, including over-the-counter medicines. Ask your health care provider about any side effects that may make you more likely to have a fall. Tell your health care provider if any medicines that you take make you feel dizzy or sleepy.  Osteoporosis screening. Osteoporosis is a condition that causes the bones to get weaker. This can make the bones weak and cause them to break more easily.  Blood pressure screening. Blood pressure changes and medicines to control blood pressure can make you feel dizzy.  Strength and balance checks. Your health care provider may recommend certain tests to check your strength and balance while standing, walking, or changing positions.  Foot health exam. Foot pain and numbness, as well as not wearing proper footwear, can make you more likely to have a fall.  Depression screening. You may be more likely  to have a fall if you have a fear of falling, feel emotionally low, or feel unable to do activities that you used to do.  Alcohol use screening. Using too much alcohol can affect your balance and may make you more likely to have a fall. What actions can I take to lower my risk of falls? General instructions  Talk with your health care provider about your risks for falling. Tell your health care provider if: ? You fall. Be sure to tell your health care provider about all falls, even ones  that seem minor. ? You feel dizzy, sleepy, or off-balance.  Take over-the-counter and prescription medicines only as told by your health care provider. These include any supplements.  Eat a healthy diet and maintain a healthy weight. A healthy diet includes low-fat dairy products, low-fat (lean) meats, and fiber from whole grains, beans, and lots of fruits and vegetables. Home safety  Remove any tripping hazards, such as rugs, cords, and clutter.  Install safety equipment such as grab bars in bathrooms and safety rails on stairs.  Keep rooms and walkways well-lit. Activity   Follow a regular exercise program to stay fit. This will help you maintain your balance. Ask your health care provider what types of exercise are appropriate for you.  If you need a cane or walker, use it as recommended by your health care provider.  Wear supportive shoes that have nonskid soles. Lifestyle  Do not drink alcohol if your health care provider tells you not to drink.  If you drink alcohol, limit how much you have: ? 0-1 drink a day for women. ? 0-2 drinks a day for men.  Be aware of how much alcohol is in your drink. In the [place] one drink equals one typical bottle of beer (12 oz), one-half glass of wine (5 oz), or one shot of hard liquor (1 oz).  Do not use any products that contain nicotine or tobacco, such as cigarettes and e-cigarettes. If you need help quitting, ask your health care provider. Summary  Having a healthy lifestyle and getting preventive care can help to protect your health and wellness after age 54.  Screening and testing are the best way to find a health problem early and help you avoid having a fall. Early diagnosis and treatment give you the best chance for managing medical conditions that are more common for people who are older than age 55.  Falls are a major cause of broken bones and head injuries in people who are older than age 58. Take precautions to prevent a fall  at home.  Work with your health care provider to learn what changes you can make to improve your health and wellness and to prevent falls. This information is not intended to replace advice given to you by your health care provider. Make sure you discuss any questions you have with your health care provider. Document Released: 08/26/2017 Document Revised: 08/26/2017 Document Reviewed: 08/26/2017 Elsevier Interactive Patient Education  2019 [employer].

## 2019-01-28 ENCOUNTER — Telehealth: Payer: Self-pay | Admitting: *Deleted

## 2019-01-28 ENCOUNTER — Telehealth: Payer: Self-pay | Admitting: Cardiology

## 2019-01-28 NOTE — Telephone Encounter (Signed)
Called and left a message for patient to call office in regards to rescheduling his face to face visit as a tele or virtual visit on 04/13 with myself. If he calls back, please route the call to my phone.    Thank you  Kathyrn Drown NP-C [facility] Pager: [phone]

## 2019-01-28 NOTE — Telephone Encounter (Signed)
I spoke with pt and instructed him on how to set up [place]. I will send him the consent form later this afternoon. I will call him Monday morning to make sure he is able to connect with his Webex.

## 2019-01-28 NOTE — Telephone Encounter (Signed)
Follow Up:      Returning your call from today. 

## 2019-01-28 NOTE — Telephone Encounter (Signed)
Spoke with pt and he would like to do a virtual visit. Pt would like to see Dr. Tamala Julian if available.  Scheduled pt for video visit with Dr. Tamala Julian on Monday.

## 2019-01-28 NOTE — Telephone Encounter (Signed)
-----   Message from Tommie Tami, NP sent at 01/28/2019 11:13 AM EDT ----- Regarding: rescheduling Spoke with this patient. He is interested in rescheduling to me. He is open as far as dates and times. We could add him to 04/07, 04/13, 04/21 or 04/22.   Thank you!

## 2019-01-31 ENCOUNTER — Encounter: Payer: Self-pay | Admitting: Interventional Cardiology

## 2019-01-31 ENCOUNTER — Telehealth (INDEPENDENT_AMBULATORY_CARE_PROVIDER_SITE_OTHER): Payer: Medicare Other | Admitting: Interventional Cardiology

## 2019-01-31 ENCOUNTER — Other Ambulatory Visit: Payer: Self-pay

## 2019-01-31 VITALS — HR 76 | Ht 71.0 in | Wt 251.0 lb

## 2019-01-31 DIAGNOSIS — D869 Sarcoidosis, unspecified: Secondary | ICD-10-CM

## 2019-01-31 DIAGNOSIS — E785 Hyperlipidemia, unspecified: Secondary | ICD-10-CM

## 2019-01-31 DIAGNOSIS — I10 Essential (primary) hypertension: Secondary | ICD-10-CM

## 2019-01-31 DIAGNOSIS — I2510 Atherosclerotic heart disease of native coronary artery without angina pectoris: Secondary | ICD-10-CM | POA: Diagnosis not present

## 2019-01-31 NOTE — Patient Instructions (Signed)

## 2019-01-31 NOTE — Progress Notes (Signed)
Virtual Visit via Video Note   This visit type was conducted due to national recommendations for restrictions regarding the COVID-19 Pandemic (e.g. social distancing) in an effort to limit this patient's exposure and mitigate transmission in our community.  Due to his co-morbid illnesses, this patient is at least at moderate risk for complications without adequate follow up.  This format is felt to be most appropriate for this patient at this time.  All issues noted in this document were discussed and addressed.  A limited physical exam was performed with this format.  Please refer to the patient's chart for his consent to telehealth for [facility].   Evaluation Performed:  Follow-up visit  Date:  01/31/2019   ID:  Levi Jones, DOB 19-May-1951, MRN [id number]  Patient Location: Home  Provider Location: Office  PCP:  Colon Branch, MD  Cardiologist: Illene Labrador, III, MD Electrophysiologist:  None   Chief Complaint: CAD, asymptomatic.  Other risk factors including hypertension, hyperlipidemia, and sarcoidosis.  History of Present Illness:    Levi Jones is a 68 y.o. male who presents via audio/video conferencing for a telehealth visit today.    Levi Jones has multiple risk factors, intermediate risk myocardial perfusion study, subsequent coronary CTA demonstrating nonobstructive plaque, normal LV function by echo, hypertension, hyperlipidemia, and quiesced sent sarcoidosis.  Levi Jones is asymptomatic.  He has not been taking atorvastatin regularly.  In December when the lab work was done by Dr. Larose Kells he was not taking the medication.  He generally feels well.  He denies chest discomfort, dyspnea, edema, palpitations, and syncope.  He takes antihypertensive therapy as prescribed.  The patient does have symptoms concerning for COVID-19 infection (fever, chills, cough, or new shortness of breath).    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   KNEE ARTHROSCOPY     Right x2   KNEE ARTHROSCOPY     Left x1     Current Meds  Medication Sig   amLODipine (NORVASC) 10 MG tablet Take 1 tablet (10 mg total) by mouth daily.   aspirin EC 81 MG tablet Take 81 mg by mouth daily.   atorvastatin (LIPITOR) 10 MG tablet Take 1 tablet (10 mg total) by mouth daily.   hydrocortisone 2.5 % cream APPLY TOPICALLY 2 (TWO) TIMES DAILY AS NEEDED.   ibuprofen (ADVIL,MOTRIN) 800 MG tablet TAKE 1 TABLET (800 MG TOTAL) BY MOUTH DAILY AS NEEDED FOR PAIN. TAKE WITH FOOD.   loratadine (CLARITIN) 10 MG tablet Take 1 tablet (10 mg total) by mouth daily as needed for allergies.   losartan (COZAAR) 100 MG tablet Take 1 tablet (100 mg total)  by mouth daily.   PRESCRIPTION MEDICATION NAME - C ONYCHOMYCOSIS COMPIUND MEDICATION Apply to affected toes and nails daily as directed by MD.     Allergies:   Patient has no known allergies.   Social History   Tobacco Use   Smoking status: Never Smoker   Smokeless tobacco: Never Used  Substance Use Topics   Alcohol use: No   Drug use: No     Family Hx: The patient's family history includes Coronary artery disease in his mother; Heart failure in his mother;  Hypertension in his mother; Lung cancer in his father. There is no history of Prostate cancer, Colon cancer, or Stroke.  ROS:   Please see the history of present illness.    Relatively sedentary.  Some GU complaints.  In reviewing the note from Dr.Paz, some concern about STD. All other systems reviewed and are negative.   Prior CV studies:   The following studies were reviewed today:   There have not been recent CV imaging or functional data since initial evaluation  Labs/Other Tests and Data Reviewed:    EKG:  No ECG reviewed.  Recent Labs: 05/24/2018: Hemoglobin 13.7; Platelets 146.0 09/29/2018: ALT 26; BUN 19; Creatinine, Ser 1.28; Potassium 4.7; Sodium 142; TSH 1.47   Recent Lipid Panel Lab Results  Component Value Date/Time   CHOL 224 (H) 09/29/2018 10:30 AM   CHOL 152 10/26/2017 08:07 AM   TRIG 129.0 09/29/2018 10:30 AM   TRIG 123 01/17/2008 12:00 AM   HDL 71.10 09/29/2018 10:30 AM   HDL 72 10/26/2017 08:07 AM   CHOLHDL 3 09/29/2018 10:30 AM   LDLCALC 127 (H) 09/29/2018 10:30 AM   LDLCALC 60 10/26/2017 08:07 AM   LDLDIRECT 118.7 07/30/2011 11:01 AM    Wt Readings from Last 3 Encounters:  01/31/19 251 lb (113.9 kg)  01/03/19 256 lb 6.4 oz (116.3 kg)  12/14/18 255 lb (115.7 kg)     Objective:    Vital Signs:  Pulse 76    Ht 5\' 11"  (1.803 m)    Wt 251 lb (113.9 kg)    BMI 35.01 kg/m    Well nourished, well developed male in no acute distress. No peripheral edema.  No noticeable cyanosis.  Pleasant and conversant.  ASSESSMENT & PLAN:    1. Coronary artery calcification seen on CT scan   2. Hyperlipidemia, unspecified hyperlipidemia type   3. Essential hypertension   4. Sarcoidosis    PLAN in order of problem:  1. He remains asymptomatic with reference to coronary disease.  We discussed identifying symptoms.  Encouraged aerobic activity versus a sedentary lifestyle.  We encouraged a heart healthy Mediterranean diet rather than high carbohydrate high fat.   Encourage low-salt and blood pressure less than equal to 130/80 mmHg. 2. Last LDL was 127 off statin therapy.  He is now back on low intensity statin therapy.  A repeat lipid panel should be done sometime this spring by Dr. Larose Kells.  If LDL is still greater than 70, atorvastatin intensification should occur until target is achieved. 3. Low-salt diet, aerobic activity, and monitoring of blood pressure with target 130/80 mmHg. 4. Quiesced sent currently.  Overall education and awareness concerning secondary risk prevention was discussed in detail: LDL less than 70, hemoglobin A1c less than 7, blood pressure target less than 130/80 mmHg, >150 minutes of moderate aerobic activity per week, avoidance of smoking, weight control (via diet and exercise), and continued surveillance/management of/for obstructive sleep apnea.  1 year follow-up.  Surveillance lipid  panel this spring by Dr. Larose Kells..  COVID-19 Education: The signs and symptoms of COVID-19 were discussed with the patient and how to seek care for testing (follow up with PCP or arrange E-visit).  The importance of social distancing was discussed today.  Time:   Today, I have spent 15 minutes with the patient with telehealth technology discussing the above problems.     Medication Adjustments/Labs and Tests Ordered: Current medicines are reviewed at length with the patient today.  Concerns regarding medicines are outlined above.  Tests Ordered: No orders of the defined types were placed in this encounter.  Medication Changes: No orders of the defined types were placed in this encounter.   Disposition:  Follow up in 1 year(s)  Signed, Sinclair Grooms, MD  01/31/2019 11:08 AM    [place]

## 2019-01-31 NOTE — Telephone Encounter (Signed)
Levi Jones did you contact patient yet?

## 2019-01-31 NOTE — Telephone Encounter (Signed)
Patient is calling to make sure he is all set for today's virtual visit.

## 2019-02-07 ENCOUNTER — Ambulatory Visit: Payer: Medicare Other | Admitting: Interventional Cardiology

## 2019-02-11 ENCOUNTER — Other Ambulatory Visit: Payer: Self-pay | Admitting: Internal Medicine

## 2019-05-23 ENCOUNTER — Other Ambulatory Visit: Payer: Self-pay | Admitting: Internal Medicine

## 2019-06-01 ENCOUNTER — Encounter: Payer: Self-pay | Admitting: Podiatry

## 2019-06-01 ENCOUNTER — Other Ambulatory Visit: Payer: Self-pay

## 2019-06-01 ENCOUNTER — Encounter: Payer: Self-pay | Admitting: Internal Medicine

## 2019-06-01 ENCOUNTER — Ambulatory Visit (INDEPENDENT_AMBULATORY_CARE_PROVIDER_SITE_OTHER): Payer: Medicare Other | Admitting: Podiatry

## 2019-06-01 ENCOUNTER — Ambulatory Visit (INDEPENDENT_AMBULATORY_CARE_PROVIDER_SITE_OTHER): Payer: Medicare Other | Admitting: Internal Medicine

## 2019-06-01 VITALS — BP 147/86 | HR 67 | Temp 98.3°F | Resp 16 | Ht 71.0 in | Wt 251.5 lb

## 2019-06-01 VITALS — Temp 97.3°F

## 2019-06-01 DIAGNOSIS — E785 Hyperlipidemia, unspecified: Secondary | ICD-10-CM

## 2019-06-01 DIAGNOSIS — I10 Essential (primary) hypertension: Secondary | ICD-10-CM

## 2019-06-01 DIAGNOSIS — Q828 Other specified congenital malformations of skin: Secondary | ICD-10-CM | POA: Diagnosis not present

## 2019-06-01 LAB — LIPID PANEL
Cholesterol: 185 mg/dL (ref 0–200)
HDL: 60 mg/dL (ref >39.00)
LDL Cholesterol: 109 mg/dL — ABNORMAL HIGH (ref 0–99)
NonHDL: 125.12
Total CHOL/HDL Ratio: 3
Triglycerides: 80 mg/dL (ref 0.0–149.0)
VLDL: 16 mg/dL (ref 0.0–40.0)

## 2019-06-01 LAB — COMPREHENSIVE METABOLIC PANEL
ALT: 17 U/L (ref 0–53)
AST: 17 U/L (ref 0–37)
Albumin: 4.1 g/dL (ref 3.5–5.2)
Alkaline Phosphatase: 37 U/L — ABNORMAL LOW (ref 39–117)
BUN: 24 mg/dL — ABNORMAL HIGH (ref 6–23)
CO2: 26 mEq/L (ref 19–32)
Calcium: 9.2 mg/dL (ref 8.4–10.5)
Chloride: 107 mEq/L (ref 96–112)
Creatinine, Ser: 1.45 mg/dL (ref 0.40–1.50)
GFR: 58.66 mL/min — ABNORMAL LOW (ref >60.00)
Glucose, Bld: 107 mg/dL — ABNORMAL HIGH (ref 70–99)
Potassium: 4.6 mEq/L (ref 3.5–5.1)
Sodium: 140 mEq/L (ref 135–145)
Total Bilirubin: 0.9 mg/dL (ref 0.2–1.2)
Total Protein: 7 g/dL (ref 6.0–8.3)

## 2019-06-01 MED ORDER — NYSTATIN-TRIAMCINOLONE 100000-0.1 UNIT/GM-% EX OINT: 1.0000 "application " | TOPICAL_OINTMENT | Freq: Two times a day (BID) | CUTANEOUS | 1 refills | Status: AC | PRN

## 2019-06-01 NOTE — Progress Notes (Signed)
Pre visit review using our clinic review tool, if applicable. No additional management support is needed unless otherwise documented below in the visit note. 

## 2019-06-01 NOTE — Progress Notes (Signed)
Subjective:    Patient ID: Levi Jones, male    DOB: 02-Sep-1951, 68 y.o.   MRN: [id number]  DOS:  06/01/2019  Type of visit - description: ROV Since the last office visit, he is taking his medications regularly Saw cardiology, note reviewed. Is eating much healthier and increase his physical activity. No ambulatory BPs.  Wt Readings from Last 3 Encounters:  06/01/19 251 lb 8 oz (114.1 kg)  01/31/19 251 lb (113.9 kg)  01/03/19 256 lb 6.4 oz (116.3 kg)   BP Readings from Last 3 Encounters:  06/01/19 (!) 147/86  01/03/19 120/64  12/14/18 122/82       Review of Systems  Occasionally has a itchy rash in the lower extremities, request Mycolog refill for as needed use Past Medical History:  Diagnosis Date  . Abnormal LFTs 07/01/2016  . ALLERGIC RHINITIS CAUSE UNSPECIFIED 03/05/2010   Qualifier: Diagnosis of  By: Stann Mainland CMA, [place]    . Anemia 07/02/2016  . Benign localized hyperplasia of prostate with urinary obstruction   . BPH (benign prostatic hyperplasia) 05/25/2018  . CAP (community acquired pneumonia) 07/01/2016  . 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 Hypertension   . Nocturia   . Osteoarthritis   . Sarcoidosis    h/o, DX in the 90's to early 2000s (Dr.Simonds)  . Sciatica 09/27/2014  . Thyromegaly 03/28/2015    Past Surgical History:  Procedure Laterality Date  . FOOT SURGERY Left     remotely, at [facility], podiatry.  Marland Kitchen KNEE ARTHROSCOPY     Right x2  . KNEE ARTHROSCOPY     Left x1    Social History   Socioeconomic History  . Marital status: Married    Spouse name: Not on file  . Number of children: 2  . Years of education: Not on file  . Highest education level: Not on file  Occupational History  . Occupation: RETIRED 12-2016, works part time--[employer]    Employer: [employer]  . Financial resource strain: Not on file  . Food insecurity    Worry: Not on file    Inability: Not on file  . Transportation needs    Medical: Not on file    Non-medical: Not on file  Tobacco Use  . Smoking status: Never Smoker  . Smokeless tobacco: Never Used  Substance and Sexual Activity  . Alcohol use: No  . Drug use: No  . Sexual activity: Yes  Lifestyle  . Physical activity    Days per week: Not on file    Minutes per session: Not on file  . Stress: Not on file  Relationships  . Social Herbalist on phone: Not on file    Gets together: Not on file    Attends religious service: Not on file    Active member of club or organization: Not on file    Attends meetings of clubs or organizations: Not on file    Relationship status: Not on file  . Intimate partner violence    Fear of current or ex partner: Not on file    Emotionally abused: Not on file    Physically abused: Not on file    Forced sexual activity: Not on file  Other Topics Concern  . Not on file  Social History Narrative  Household-- pt, wife and daughter   2 adult children      Allergies as of 06/01/2019   No Known Allergies     Medication List       Accurate as of June 01, 2019 11:59 PM. If you have any questions, ask your nurse or doctor.        amLODipine 10 MG tablet Commonly known as: NORVASC Take 1 tablet (10 mg total) by mouth daily.   aspirin EC 81 MG tablet Take 81 mg by mouth daily.   atorvastatin 10 MG tablet Commonly known as: LIPITOR Take 1 tablet (10 mg total) by mouth daily.   hydrocortisone 2.5 % cream APPLY TOPICALLY 2 (TWO) TIMES DAILY AS NEEDED.   ibuprofen 800 MG tablet Commonly known as: ADVIL TAKE 1 TABLET (800 MG TOTAL) BY MOUTH DAILY AS NEEDED FOR PAIN. TAKE WITH FOOD.   loratadine 10 MG tablet Commonly known as: CLARITIN Take 1 tablet (10 mg total) by mouth daily as needed for allergies.   losartan 100 MG tablet Commonly known as: COZAAR  Take 1 tablet (100 mg total) by mouth daily.   nystatin-triamcinolone ointment Commonly known as: MYCOLOG Apply 1 application topically 2 (two) times daily as needed. Use sparingly Started by: Kathlene November, MD   [place] Apply to affected toes and nails daily as directed by MD.           Objective:   Physical Exam BP (!) 147/86 (BP Location: Left Arm, Patient Position: Sitting, Cuff Size: Small)   Pulse 67   Temp 98.3 F (36.8 C) (Oral)   Resp 16   Ht 5\' 11"  (1.803 m)   Wt 251 lb 8 oz (114.1 kg)   SpO2 98%   BMI 35.08 kg/m  General:   Well developed, NAD, BMI noted. HEENT:  Normocephalic . Face symmetric, atraumatic Lungs:  CTA B Normal respiratory effort, no intercostal retractions, no accessory muscle use. Heart: RRR,  no murmur.  No pretibial edema bilaterally  Skin: Not pale. Not jaundice Neurologic:  alert & oriented X3.  Speech normal, gait appropriate for age and unassisted Psych--  Cognition and judgment appear intact.  Cooperative with normal attention span and concentration.  Behavior appropriate. No anxious or depressed appearing.      Assessment     Assessment   HTN Hyperlipidemia: Started Lipitor 10-18 DJD BPH last visit w/ urology 01-2016  Sarcoidosis, dx 1990s, Dr. Alva Garnet MSK: knee pain, DJD Eczema (L.E.) LLL PNM 06-2016 CV: C/o CP:- 06-2017: Abnormal stress test, EF decreased; 07-2017: Echo: wnl  EF, coronary CT: Mild disease. Saw cards.  Plan: CV RF control, LDL goal 70  PLAN: HTN:BP today slightly elevated, no ambulatory BPs, typically BPs are very good.  Continue amlodipine, losartan, check a CMP Hyperlipidemia: Currently on Lipitor, good compliance, no apparent side effects, check a FLP. Compliance: We had a long conversation about it, he remains somewhat reluctant to continue medications, explained him that there are things that he can control -diet and exercise-  but not his  genotype.  Encouraged to continue his healthier lifestyle and medications.  He verbalized understanding. Coronary artery calcification on CT: Saw cardiology 01/31/2019, they recommended lifestyle modification and control CV RF Recommend a flu shot this year RTC December 2020 CPX

## 2019-06-01 NOTE — Progress Notes (Signed)
This patient present to the office  with chief complaint of callus developing under the  ball of left  feet.  She says this callus has become painful walking and wearing her shoes. Patient has provided no  treatment or sought professional help.  She presents to the office for treatment of her painful callus.  Vascular  Dorsalis pedis and posterior tibial pulses are palpable  B/L.  Capillary return  WNL.  Temperature gradient is  WNL.  Skin turgor  WNL  Sensorium  Senn Weinstein monofilament wire  WNL. Normal tactile sensation.  Nail Exam  Patient has normal nails with no evidence of bacterial or fungal infection.  Orthopedic  Exam  Muscle tone and muscle strength  WNL.  No limitations of motion feet  B/L.  No crepitus or joint effusion noted.  Foot type is unremarkable and digits show no abnormalities.  Bony prominences are unremarkable. Severe HAV  B/L.  Skin  No open lesions.  Normal skin texture and turgor.  Callus/porokeratosis  sub 1st met left foot.  Porokeratosis secondary plantar flexed sesamoid bone secondary severe HAV  B/L.  B/L   Debride callus/porokeratosis.  Discussed condition with patient. Padding applied to shoe.  RTC prn.  Gardiner Barefoot DPM

## 2019-06-01 NOTE — Patient Instructions (Signed)
GO TO THE LAB : Get the blood work     GO TO THE FRONT DESK Schedule your next appointment for a physical exam by December 2020  Get a flu shot this fall  Check the  blood pressure WEEKLY BP GOAL is between 110/65 and  135/85. If it is consistently higher or lower, let me know

## 2019-06-02 NOTE — Assessment & Plan Note (Signed)
HTN:BP today slightly elevated, no ambulatory BPs, typically BPs are very good.  Continue amlodipine, losartan, check a CMP Hyperlipidemia: Currently on Lipitor, good compliance, no apparent side effects, check a FLP. Compliance: We had a long conversation about it, he remains somewhat reluctant to continue medications, explained him that there are things that he can control -diet and exercise-  but not his genotype.  Encouraged to continue his healthier lifestyle and medications.  He verbalized understanding. Coronary artery calcification on CT: Saw cardiology 01/31/2019, they recommended lifestyle modification and control CV RF Recommend a flu shot this year RTC December 2020 CPX

## 2019-06-03 MED ORDER — ATORVASTATIN CALCIUM 20 MG PO TABS: 20.0000 mg | ORAL_TABLET | Freq: Every day | ORAL | 6 refills | Status: AC

## 2019-06-03 NOTE — Addendum Note (Signed)
Addended byDamita Dunnings D on: 06/03/2019 03:50 PM   Modules accepted: Orders

## 2019-09-01 ENCOUNTER — Other Ambulatory Visit: Payer: Self-pay | Admitting: Internal Medicine

## 2019-09-25 ENCOUNTER — Other Ambulatory Visit: Payer: Self-pay | Admitting: Internal Medicine

## 2019-09-27 ENCOUNTER — Other Ambulatory Visit: Payer: Self-pay

## 2019-09-28 ENCOUNTER — Ambulatory Visit (INDEPENDENT_AMBULATORY_CARE_PROVIDER_SITE_OTHER): Payer: Medicare Other | Admitting: Internal Medicine

## 2019-09-28 ENCOUNTER — Encounter: Payer: Self-pay | Admitting: Internal Medicine

## 2019-09-28 VITALS — BP 134/85 | HR 78 | Temp 95.5°F | Resp 16 | Ht 71.0 in | Wt 254.1 lb

## 2019-09-28 DIAGNOSIS — Z0000 Encounter for general adult medical examination without abnormal findings: Secondary | ICD-10-CM | POA: Diagnosis not present

## 2019-09-28 DIAGNOSIS — Z23 Encounter for immunization: Secondary | ICD-10-CM | POA: Diagnosis not present

## 2019-09-28 DIAGNOSIS — E785 Hyperlipidemia, unspecified: Secondary | ICD-10-CM | POA: Diagnosis not present

## 2019-09-28 DIAGNOSIS — R739 Hyperglycemia, unspecified: Secondary | ICD-10-CM | POA: Diagnosis not present

## 2019-09-28 LAB — COMPREHENSIVE METABOLIC PANEL WITH GFR
ALT: 19 U/L (ref 0–53)
AST: 16 U/L (ref 0–37)
Albumin: 4.1 g/dL (ref 3.5–5.2)
Alkaline Phosphatase: 42 U/L (ref 39–117)
BUN: 18 mg/dL (ref 6–23)
CO2: 25 meq/L (ref 19–32)
Calcium: 9.1 mg/dL (ref 8.4–10.5)
Chloride: 105 meq/L (ref 96–112)
Creatinine, Ser: 1.29 mg/dL (ref 0.40–1.50)
GFR: 67.07 mL/min
Glucose, Bld: 124 mg/dL — ABNORMAL HIGH (ref 70–99)
Potassium: 3.9 meq/L (ref 3.5–5.1)
Sodium: 140 meq/L (ref 135–145)
Total Bilirubin: 1 mg/dL (ref 0.2–1.2)
Total Protein: 7 g/dL (ref 6.0–8.3)

## 2019-09-28 LAB — HEMOGLOBIN A1C: Hgb A1c MFr Bld: 5.3 % (ref 4.6–6.5)

## 2019-09-28 LAB — LIPID PANEL
Cholesterol: 164 mg/dL (ref 0–200)
HDL: 61.2 mg/dL (ref >39.00)
LDL Cholesterol: 79 mg/dL (ref 0–99)
NonHDL: 102.7
Total CHOL/HDL Ratio: 3
Triglycerides: 117 mg/dL (ref 0.0–149.0)
VLDL: 23.4 mg/dL (ref 0.0–40.0)

## 2019-09-28 LAB — CBC WITH DIFFERENTIAL/PLATELET
Basophils Absolute: 0.1 10*3/uL (ref 0.0–0.1)
Basophils Relative: 0.7 % (ref 0.0–3.0)
Eosinophils Absolute: 0.4 10*3/uL (ref 0.0–0.7)
Eosinophils Relative: 4.5 % (ref 0.0–5.0)
HCT: 42 % (ref 39.0–52.0)
Hemoglobin: 13.5 g/dL (ref 13.0–17.0)
Lymphocytes Relative: 27.9 % (ref 12.0–46.0)
Lymphs Abs: 2.2 10*3/uL (ref 0.7–4.0)
MCHC: 32.2 g/dL (ref 30.0–36.0)
MCV: 88.5 fl (ref 78.0–100.0)
Monocytes Absolute: 0.7 10*3/uL (ref 0.1–1.0)
Monocytes Relative: 8.3 % (ref 3.0–12.0)
Neutro Abs: 4.7 10*3/uL (ref 1.4–7.7)
Neutrophils Relative %: 58.6 % (ref 43.0–77.0)
Platelets: 148 10*3/uL — ABNORMAL LOW (ref 150.0–400.0)
RBC: 4.75 Mil/uL (ref 4.22–5.81)
RDW: 12.9 % (ref 11.5–15.5)
WBC: 7.9 10*3/uL (ref 4.0–10.5)

## 2019-09-28 NOTE — Progress Notes (Signed)
Pre visit review using our clinic review tool, if applicable. No additional management support is needed unless otherwise documented below in the visit note. 

## 2019-09-28 NOTE — Patient Instructions (Addendum)
GO TO THE LAB : Get the blood work     GO TO THE FRONT DESK Schedule your next appointment   for a checkup in 6 months  Check your blood pressure weekly BP GOAL is between 110/65 and  135/85. If it is consistently higher or lower, let me know

## 2019-09-28 NOTE — Assessment & Plan Note (Signed)
-  Td  2011.  Declined all shots except influenza.  Flu shot today.  -CCS: Colonoscopy 2004 wnl Scope 02-2014, + Polyp, high-grade dysplasia Had a cscope ~ 03-2015, next per Dr. Michail Sermon (5 years per pt) -prostate cancer screening: DRE and PSA normal 2019. -Labs: CMP FLP A1c CBC -Diet and exercise discussed

## 2019-09-28 NOTE — Progress Notes (Signed)
Subjective:    Patient ID: Levi Jones, male    DOB: [birth year], 68 y.o.   MRN: [id number]  DOS:  09/28/2019 Type of visit - description: CPX No major concerns.  Good compliance with medication.   Review of Systems  A 14 point review of systems is negative    Past Medical History:  Diagnosis Date  . Abnormal LFTs 07/01/2016  . ALLERGIC RHINITIS CAUSE UNSPECIFIED 03/05/2010   Qualifier: Diagnosis of  By: Stann Mainland CMA, Alida    . Anemia 07/02/2016  . Benign localized hyperplasia of prostate with urinary obstruction   . BPH (benign prostatic hyperplasia) 05/25/2018  . CAP (community acquired pneumonia) 07/01/2016  . 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 Hypertension   . Nocturia   . Osteoarthritis   . Sarcoidosis    h/o, DX in the 90's to early 2000s (Dr.Simonds)  . Sciatica 09/27/2014    Past Surgical History:  Procedure Laterality Date  . FOOT SURGERY Left     remotely, at [facility], podiatry.  Marland Kitchen KNEE ARTHROSCOPY     Right x2  . KNEE ARTHROSCOPY     Left x1    Social History   Socioeconomic History  . Marital status: Married    Spouse name: Not on file  . Number of children: 2  . Years of education: Not on file  . Highest education level: Not on file  Occupational History  . 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 Financial resource strain: Not on file  . Food insecurity    Worry: Not on file    Inability: Not on file  . Transportation needs    Medical: Not on file    Non-medical: Not on file  Tobacco Use  . Smoking status: Never Smoker  . Smokeless tobacco: Never Used  Substance and Sexual Activity  . Alcohol use: No  . Drug use: No  . Sexual activity: Yes  Lifestyle  . Physical activity    Days per week: Not on file    Minutes  per session: Not on file  . Stress: Not on file  Relationships  . Social Herbalist on phone: Not on file    Gets together: Not on file    Attends religious service: Not on file    Active member of club or organization: Not on file    Attends meetings of clubs or organizations: Not on file    Relationship status: Not on file  . Intimate partner violence    Fear of current or ex partner: Not on file    Emotionally abused: Not on file    Physically abused: Not on file    Forced sexual activity: Not on file  Other Topics Concern  . Not on file  Social History Narrative    Household-- pt, wife    2 adult children     Family History  Problem Relation Age of Onset  . Lung cancer Father        smoker  . Coronary artery disease Mother 108       stents at age 53  . Hypertension Mother   . Heart failure Mother   . Prostate cancer Neg Hx   . Colon cancer Neg Hx   . Stroke  Neg Hx      Allergies as of 09/28/2019   No Known Allergies     Medication List       Accurate as of September 28, 2019 11:59 PM. If you have any questions, ask your nurse or doctor.        amLODipine 10 MG tablet Commonly known as: NORVASC Take 1 tablet (10 mg total) by mouth daily.   aspirin EC 81 MG tablet Take 81 mg by mouth daily.   atorvastatin 20 MG tablet Commonly known as: LIPITOR Take 1 tablet (20 mg total) by mouth at bedtime.   hydrocortisone 2.5 % cream Apply topically 2 (two) times daily as needed.   ibuprofen 800 MG tablet Commonly known as: ADVIL TAKE 1 TABLET (800 MG TOTAL) BY MOUTH DAILY AS NEEDED FOR PAIN. TAKE WITH FOOD.   loratadine 10 MG tablet Commonly known as: CLARITIN Take 1 tablet (10 mg total) by mouth daily as needed for allergies.   losartan 100 MG tablet Commonly known as: COZAAR Take 1 tablet (100 mg total) by mouth daily.   nystatin-triamcinolone ointment Commonly known as: MYCOLOG Apply 1 application topically 2 (two) times daily as needed. Use  sparingly   PRESCRIPTION MEDICATION NAME - C ONYCHOMYCOSIS COMPIUND MEDICATION Apply to affected toes and nails daily as directed by MD.           Objective:   Physical Exam BP 134/85 (BP Location: Left Arm, Patient Position: Sitting, Cuff Size: Normal)   Pulse 78   Temp (!) 95.5 F (35.3 C) (Temporal)   Resp 16   Ht 5\' 11"  (1.803 m)   Wt 254 lb 2 oz (115.3 kg)   SpO2 99%   BMI 35.44 kg/m  General: Well developed, NAD, BMI noted Neck: No  thyromegaly  HEENT:  Normocephalic . Face symmetric, atraumatic Lungs:  CTA B Normal respiratory effort, no intercostal retractions, no accessory muscle use. Heart: RRR,  no murmur.  No pretibial edema bilaterally  Abdomen:  Not distended, soft, non-tender. No rebound or rigidity.   Skin: Exposed areas without rash. Not pale. Not jaundice Neurologic:  alert & oriented X3.  Speech normal, gait appropriate for age and unassisted Strength symmetric and appropriate for age.  Psych: Cognition and judgment appear intact.  Cooperative with normal attention span and concentration.  Behavior appropriate. No anxious or depressed appearing.     Assessment      Assessment   HTN Hyperlipidemia: Started Lipitor 10-18 DJD BPH last visit w/ urology 01-2016  Sarcoidosis, dx 1990s, Dr. Alva Garnet MSK: knee pain, DJD Eczema (L.E.) LLL PNM 06-2016 CV: C/o CP:- 06-2017: Abnormal stress test, EF decreased; 07-2017: Echo: wnl  EF, coronary CT: Mild disease. Saw cards.  Plan: CV RF control, LDL goal 70  PLAN: Here for CPX [id number] normal ambulatory BPs, continue amlodipine, losartan. Hyperlipidemia: Based on the last FLP, Lipitor was increased to 20 mg, good compliance, checking labs DJD: Very rarely takes ibuprofen. RTC 6 months   This visit occurred during the SARS-CoV-2 public health emergency.  Safety protocols were in place, including screening questions prior to the visit, additional usage of staff PPE, and extensive cleaning of exam  room while observing appropriate contact time as indicated for disinfecting solutions.

## 2019-09-29 NOTE — Assessment & Plan Note (Signed)
Here for CPX [id number] normal ambulatory BPs, continue amlodipine, losartan. Hyperlipidemia: Based on the last FLP, Lipitor was increased to 20 mg, good compliance, checking labs DJD: Very rarely takes ibuprofen. RTC 6 months

## 2019-10-27 ENCOUNTER — Ambulatory Visit: Payer: Medicare Other | Admitting: Internal Medicine

## 2019-11-01 ENCOUNTER — Inpatient Hospital Stay (HOSPITAL_COMMUNITY)
Admission: EM | Admit: 2019-11-01 | Discharge: 2019-11-28 | DRG: 177 | Disposition: E | Payer: Medicare Other | Attending: Internal Medicine | Admitting: Internal Medicine

## 2019-11-01 ENCOUNTER — Emergency Department (HOSPITAL_COMMUNITY): Payer: Medicare Other

## 2019-11-01 DIAGNOSIS — E785 Hyperlipidemia, unspecified: Secondary | ICD-10-CM | POA: Diagnosis present

## 2019-11-01 DIAGNOSIS — R739 Hyperglycemia, unspecified: Secondary | ICD-10-CM | POA: Diagnosis present

## 2019-11-01 DIAGNOSIS — N179 Acute kidney failure, unspecified: Secondary | ICD-10-CM | POA: Diagnosis present

## 2019-11-01 DIAGNOSIS — U071 COVID-19: Secondary | ICD-10-CM

## 2019-11-01 DIAGNOSIS — G9340 Encephalopathy, unspecified: Secondary | ICD-10-CM | POA: Diagnosis not present

## 2019-11-01 DIAGNOSIS — E870 Hyperosmolality and hypernatremia: Secondary | ICD-10-CM | POA: Diagnosis not present

## 2019-11-01 DIAGNOSIS — E43 Unspecified severe protein-calorie malnutrition: Secondary | ICD-10-CM | POA: Diagnosis present

## 2019-11-01 DIAGNOSIS — D869 Sarcoidosis, unspecified: Secondary | ICD-10-CM | POA: Diagnosis not present

## 2019-11-01 DIAGNOSIS — Z781 Physical restraint status: Secondary | ICD-10-CM | POA: Diagnosis not present

## 2019-11-01 DIAGNOSIS — N1831 Chronic kidney disease, stage 3a: Secondary | ICD-10-CM | POA: Diagnosis present

## 2019-11-01 DIAGNOSIS — J690 Pneumonitis due to inhalation of food and vomit: Secondary | ICD-10-CM | POA: Diagnosis present

## 2019-11-01 DIAGNOSIS — T380X5A Adverse effect of glucocorticoids and synthetic analogues, initial encounter: Secondary | ICD-10-CM | POA: Diagnosis present

## 2019-11-01 DIAGNOSIS — J9601 Acute respiratory failure with hypoxia: Secondary | ICD-10-CM

## 2019-11-01 DIAGNOSIS — I959 Hypotension, unspecified: Secondary | ICD-10-CM | POA: Diagnosis not present

## 2019-11-01 DIAGNOSIS — D860 Sarcoidosis of lung: Secondary | ICD-10-CM | POA: Diagnosis present

## 2019-11-01 DIAGNOSIS — Z7189 Other specified counseling: Secondary | ICD-10-CM | POA: Diagnosis not present

## 2019-11-01 DIAGNOSIS — K5900 Constipation, unspecified: Secondary | ICD-10-CM | POA: Diagnosis present

## 2019-11-01 DIAGNOSIS — E860 Dehydration: Secondary | ICD-10-CM | POA: Diagnosis present

## 2019-11-01 DIAGNOSIS — N189 Chronic kidney disease, unspecified: Secondary | ICD-10-CM | POA: Diagnosis not present

## 2019-11-01 DIAGNOSIS — J309 Allergic rhinitis, unspecified: Secondary | ICD-10-CM | POA: Diagnosis present

## 2019-11-01 DIAGNOSIS — Z66 Do not resuscitate: Secondary | ICD-10-CM | POA: Diagnosis not present

## 2019-11-01 DIAGNOSIS — I10 Essential (primary) hypertension: Secondary | ICD-10-CM | POA: Diagnosis present

## 2019-11-01 DIAGNOSIS — E872 Acidosis: Secondary | ICD-10-CM | POA: Diagnosis present

## 2019-11-01 DIAGNOSIS — F419 Anxiety disorder, unspecified: Secondary | ICD-10-CM | POA: Diagnosis present

## 2019-11-01 DIAGNOSIS — J1282 Pneumonia due to coronavirus disease 2019: Secondary | ICD-10-CM | POA: Diagnosis present

## 2019-11-01 DIAGNOSIS — I130 Hypertensive heart and chronic kidney disease with heart failure and stage 1 through stage 4 chronic kidney disease, or unspecified chronic kidney disease: Secondary | ICD-10-CM | POA: Diagnosis present

## 2019-11-01 DIAGNOSIS — Z515 Encounter for palliative care: Secondary | ICD-10-CM | POA: Diagnosis not present

## 2019-11-01 DIAGNOSIS — J069 Acute upper respiratory infection, unspecified: Secondary | ICD-10-CM | POA: Insufficient documentation

## 2019-11-01 DIAGNOSIS — R7989 Other specified abnormal findings of blood chemistry: Secondary | ICD-10-CM | POA: Diagnosis not present

## 2019-11-01 DIAGNOSIS — Z7982 Long term (current) use of aspirin: Secondary | ICD-10-CM | POA: Diagnosis not present

## 2019-11-01 DIAGNOSIS — Z79899 Other long term (current) drug therapy: Secondary | ICD-10-CM | POA: Diagnosis not present

## 2019-11-01 DIAGNOSIS — J9600 Acute respiratory failure, unspecified whether with hypoxia or hypercapnia: Secondary | ICD-10-CM | POA: Diagnosis present

## 2019-11-01 DIAGNOSIS — I509 Heart failure, unspecified: Secondary | ICD-10-CM | POA: Diagnosis present

## 2019-11-01 DIAGNOSIS — R0602 Shortness of breath: Secondary | ICD-10-CM

## 2019-11-01 DIAGNOSIS — Z6832 Body mass index (BMI) 32.0-32.9, adult: Secondary | ICD-10-CM

## 2019-11-01 DIAGNOSIS — R6889 Other general symptoms and signs: Secondary | ICD-10-CM

## 2019-11-01 DIAGNOSIS — N400 Enlarged prostate without lower urinary tract symptoms: Secondary | ICD-10-CM | POA: Diagnosis present

## 2019-11-01 DIAGNOSIS — D539 Nutritional anemia, unspecified: Secondary | ICD-10-CM | POA: Diagnosis present

## 2019-11-01 LAB — COMPREHENSIVE METABOLIC PANEL
ALT: 114 U/L — ABNORMAL HIGH (ref 0–44)
AST: 119 U/L — ABNORMAL HIGH (ref 15–41)
Albumin: 3.5 g/dL (ref 3.5–5.0)
Alkaline Phosphatase: 64 U/L (ref 38–126)
Anion gap: 13 (ref 5–15)
BUN: 17 mg/dL (ref 8–23)
CO2: 25 mmol/L (ref 22–32)
Calcium: 8.4 mg/dL — ABNORMAL LOW (ref 8.9–10.3)
Chloride: 101 mmol/L (ref 98–111)
Creatinine, Ser: 1.43 mg/dL — ABNORMAL HIGH (ref 0.61–1.24)
GFR calc Af Amer: 58 mL/min — ABNORMAL LOW (ref >60)
GFR calc non Af Amer: 50 mL/min — ABNORMAL LOW (ref >60)
Glucose, Bld: 203 mg/dL — ABNORMAL HIGH (ref 70–99)
Potassium: 3.9 mmol/L (ref 3.5–5.1)
Sodium: 139 mmol/L (ref 135–145)
Total Bilirubin: 1.7 mg/dL — ABNORMAL HIGH (ref 0.3–1.2)
Total Protein: 7.5 g/dL (ref 6.5–8.1)

## 2019-11-01 LAB — CBC WITH DIFFERENTIAL/PLATELET
Abs Immature Granulocytes: 0.33 10*3/uL — ABNORMAL HIGH (ref 0.00–0.07)
Basophils Absolute: 0 10*3/uL (ref 0.0–0.1)
Basophils Relative: 0 %
Eosinophils Absolute: 0 10*3/uL (ref 0.0–0.5)
Eosinophils Relative: 0 %
HCT: 37.9 % — ABNORMAL LOW (ref 39.0–52.0)
Hemoglobin: 12 g/dL — ABNORMAL LOW (ref 13.0–17.0)
Immature Granulocytes: 3 %
Lymphocytes Relative: 7 %
Lymphs Abs: 0.7 10*3/uL (ref 0.7–4.0)
MCH: 28 pg (ref 26.0–34.0)
MCHC: 31.7 g/dL (ref 30.0–36.0)
MCV: 88.3 fL (ref 80.0–100.0)
Monocytes Absolute: 0.6 10*3/uL (ref 0.1–1.0)
Monocytes Relative: 5 %
Neutro Abs: 8.9 10*3/uL — ABNORMAL HIGH (ref 1.7–7.7)
Neutrophils Relative %: 85 %
Platelets: 228 10*3/uL (ref 150–400)
RBC: 4.29 MIL/uL (ref 4.22–5.81)
RDW: 12.3 % (ref 11.5–15.5)
WBC: 10.6 10*3/uL — ABNORMAL HIGH (ref 4.0–10.5)
nRBC: 1 % — ABNORMAL HIGH (ref 0.0–0.2)

## 2019-11-01 LAB — TYPE AND SCREEN
ABO/RH(D): B POS
Antibody Screen: NEGATIVE

## 2019-11-01 LAB — FERRITIN: Ferritin: 4398 ng/mL — ABNORMAL HIGH (ref 24–336)

## 2019-11-01 LAB — POC SARS CORONAVIRUS 2 AG -  ED: SARS Coronavirus 2 Ag: POSITIVE — AB

## 2019-11-01 LAB — D-DIMER, QUANTITATIVE: D-Dimer, Quant: 1.62 ug/mL-FEU — ABNORMAL HIGH (ref 0.00–0.50)

## 2019-11-01 LAB — FIBRINOGEN: Fibrinogen: 772 mg/dL — ABNORMAL HIGH (ref 210–475)

## 2019-11-01 LAB — ABO/RH: ABO/RH(D): B POS

## 2019-11-01 LAB — PROCALCITONIN: Procalcitonin: 0.2 ng/mL

## 2019-11-01 LAB — C-REACTIVE PROTEIN: CRP: 18.5 mg/dL — ABNORMAL HIGH (ref <1.0)

## 2019-11-01 LAB — TRIGLYCERIDES: Triglycerides: 112 mg/dL (ref <150)

## 2019-11-01 LAB — LACTIC ACID, PLASMA
Lactic Acid, Venous: 3.2 mmol/L (ref 0.5–1.9)
Lactic Acid, Venous: 2.2 mmol/L (ref 0.5–1.9)

## 2019-11-01 LAB — LACTATE DEHYDROGENASE: LDH: 911 U/L — ABNORMAL HIGH (ref 98–192)

## 2019-11-01 MED ORDER — SODIUM CHLORIDE 0.9 % IV SOLN
200.0000 mg | Freq: Once | INTRAVENOUS | Status: AC
  Administered 2019-11-01: 11:00:00 200 mg via INTRAVENOUS
  Filled 2019-11-01: qty 200

## 2019-11-01 MED ORDER — ENOXAPARIN SODIUM 40 MG/0.4ML ~~LOC~~ SOLN: 40.0000 mg | Freq: Two times a day (BID) | SUBCUTANEOUS | Status: DC

## 2019-11-01 MED ORDER — ENOXAPARIN SODIUM 40 MG/0.4ML ~~LOC~~ SOLN: 40.0000 mg | SUBCUTANEOUS | Status: DC

## 2019-11-01 MED ORDER — ENOXAPARIN SODIUM 60 MG/0.6ML ~~LOC~~ SOLN
60.0000 mg | Freq: Two times a day (BID) | SUBCUTANEOUS | Status: DC
  Administered 2019-11-02: 60 mg via SUBCUTANEOUS
  Filled 2019-11-01 (×2): qty 0.6

## 2019-11-01 MED ORDER — ZINC SULFATE 220 (50 ZN) MG PO CAPS
220.0000 mg | ORAL_CAPSULE | Freq: Every day | ORAL | Status: DC
  Administered 2019-11-01 – 2019-11-09 (×9): 220 mg via ORAL
  Filled 2019-11-01 (×9): qty 1

## 2019-11-01 MED ORDER — ENOXAPARIN SODIUM 60 MG/0.6ML ~~LOC~~ SOLN
0.5000 mg/kg | Freq: Two times a day (BID) | SUBCUTANEOUS | Status: DC
  Administered 2019-11-01: 60 mg via SUBCUTANEOUS
  Filled 2019-11-01 (×2): qty 0.6

## 2019-11-01 MED ORDER — ASCORBIC ACID 500 MG PO TABS
500.0000 mg | ORAL_TABLET | Freq: Every day | ORAL | Status: DC
  Administered 2019-11-01 – 2019-11-09 (×9): 500 mg via ORAL
  Filled 2019-11-01 (×9): qty 1

## 2019-11-01 MED ORDER — KETOROLAC TROMETHAMINE 30 MG/ML IJ SOLN
15.0000 mg | Freq: Once | INTRAMUSCULAR | Status: AC
  Administered 2019-11-01: 09:00:00 15 mg via INTRAVENOUS
  Filled 2019-11-01: qty 1

## 2019-11-01 MED ORDER — SODIUM CHLORIDE 0.9% IV SOLUTION: Freq: Once | INTRAVENOUS | Status: AC

## 2019-11-01 MED ORDER — DEXAMETHASONE SODIUM PHOSPHATE 10 MG/ML IJ SOLN
6.0000 mg | INTRAMUSCULAR | Status: DC
  Administered 2019-11-01 – 2019-11-02 (×2): 6 mg via INTRAVENOUS
  Filled 2019-11-01: qty 1

## 2019-11-01 MED ORDER — GUAIFENESIN-DM 100-10 MG/5ML PO SYRP
10.0000 mL | ORAL_SOLUTION | ORAL | Status: DC | PRN
  Administered 2019-11-11: 11:00:00 10 mL via ORAL
  Filled 2019-11-01 (×2): qty 10

## 2019-11-01 MED ORDER — AMLODIPINE BESYLATE 5 MG PO TABS
10.0000 mg | ORAL_TABLET | Freq: Every day | ORAL | Status: DC
  Administered 2019-11-01 – 2019-11-05 (×5): 10 mg via ORAL
  Filled 2019-11-01: qty 1
  Filled 2019-11-01 (×4): qty 2
  Filled 2019-11-01: qty 1

## 2019-11-01 MED ORDER — SODIUM CHLORIDE 0.9 % IV BOLUS
1000.0000 mL | Freq: Once | INTRAVENOUS | Status: AC
  Administered 2019-11-01: 09:00:00 1000 mL via INTRAVENOUS

## 2019-11-01 MED ORDER — LORATADINE 10 MG PO TABS
10.0000 mg | ORAL_TABLET | Freq: Every day | ORAL | Status: DC
  Administered 2019-11-01 – 2019-11-09 (×9): 10 mg via ORAL
  Filled 2019-11-01 (×9): qty 1

## 2019-11-01 MED ORDER — ALBUTEROL SULFATE HFA 108 (90 BASE) MCG/ACT IN AERS
2.0000 | INHALATION_SPRAY | Freq: Four times a day (QID) | RESPIRATORY_TRACT | Status: DC
  Administered 2019-11-01 – 2019-11-09 (×32): 2 via RESPIRATORY_TRACT
  Filled 2019-11-01 (×3): qty 6.7

## 2019-11-01 MED ORDER — ONDANSETRON HCL 4 MG/2ML IJ SOLN
4.0000 mg | Freq: Once | INTRAMUSCULAR | Status: AC
  Administered 2019-11-01: 09:00:00 4 mg via INTRAVENOUS
  Filled 2019-11-01: qty 2

## 2019-11-01 MED ORDER — TOCILIZUMAB 400 MG/20ML IV SOLN: 8.0000 mg/kg | Freq: Once | INTRAVENOUS | Status: DC

## 2019-11-01 MED ORDER — SODIUM CHLORIDE 0.9 % IV SOLN
100.0000 mg | Freq: Every day | INTRAVENOUS | Status: AC
  Administered 2019-11-02 – 2019-11-05 (×4): 100 mg via INTRAVENOUS
  Filled 2019-11-01: qty 20
  Filled 2019-11-01: qty 100
  Filled 2019-11-01: qty 20
  Filled 2019-11-01: qty 100

## 2019-11-01 MED ORDER — SODIUM CHLORIDE 0.9 % IV SOLN: 100.0000 mg | Freq: Every day | INTRAVENOUS | Status: DC

## 2019-11-01 MED ORDER — SODIUM CHLORIDE 0.9 % IV SOLN: 200.0000 mg | Freq: Once | INTRAVENOUS | Status: DC

## 2019-11-01 MED ORDER — DEXAMETHASONE SODIUM PHOSPHATE 10 MG/ML IJ SOLN
6.0000 mg | INTRAMUSCULAR | Status: DC
  Filled 2019-11-01: qty 1

## 2019-11-01 MED ORDER — TOCILIZUMAB 400 MG/20ML IV SOLN
800.0000 mg | Freq: Once | INTRAVENOUS | Status: AC
  Administered 2019-11-01: 13:00:00 800 mg via INTRAVENOUS
  Filled 2019-11-01: qty 40

## 2019-11-01 MED ORDER — HYDROCOD POLST-CPM POLST ER 10-8 MG/5ML PO SUER
5.0000 mL | Freq: Once | ORAL | Status: AC
  Administered 2019-11-01: 5 mL via ORAL
  Filled 2019-11-01: qty 5

## 2019-11-01 NOTE — ED Notes (Signed)
Levi Jones, wife, would like an update on her husband, [phone].

## 2019-11-01 NOTE — ED Notes (Signed)
Pt stood to use urinal and O2 dropped to 85%.

## 2019-11-01 NOTE — Progress Notes (Signed)
RT Note: Pt. removed from Heated HHFNC/placed on HFNC([place]) due to ># of humidity(fallout) in tubing found upon my arrival due to heater temp. monitors not being connected, pt. kept on NRB mask with second vent flap placed on which was found on pts. chest currently @ 15 lpm, HFNC([place]) placed underneath to achieve Oxygen saturations >85%, currently 90%, pt. remains in moderate distress though able to speak full sentences with b/l b.s.-diminished with good aeration, has received Inhaler recently by RN, call bell w/in reach, RT to monitor.

## 2019-11-01 NOTE — Plan of Care (Signed)
69 yo M PMH HTN, BPH, Osteoarthritis, Sarcoidosis who presents to [facility] with progressive fever, chills, weakness, fatigue. Known COVID-19 positive on 12/24. 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 Weakness, fatigue, fever began several days agoa fter testing positive for COVID-19.  In ED, patient SpO2 50% on RA. He was started on NRB with SpO2 initially 90s. Progressively failed to maintain SpO2 > 85% and now on HFNC + NRB with SpO2 93%. PCCM asked to evaluate for possible intubation.  Upon PCCM arrival patient is comfortable appearing on NRB + HFNC with SpO2 99%.   Plan of Care:  Acute Respiratory Failure with Hypoxia due to COVID-19 PNA P -Does not warrant intubation at this juncture and is comfortable appearing on NRB + HFNC  -Stable for admission to SDU with TRH as planned -COVID-specific therapies per primary team (noted that remdesevir, decadron, toci are ordered)  -recommend self-proning if able   Eliseo Gum MSN, AGACNP-BC [place] [phone] If no answer, [phone] 11/20/2019, 11:54 AM

## 2019-11-01 NOTE — ED Notes (Signed)
Plasma ready, per blood bank for this patient, informed Chelsea,RN.

## 2019-11-01 NOTE — ED Notes (Signed)
ED Provider at bedside. 

## 2019-11-01 NOTE — ED Notes (Addendum)
Pt humidified nasal canula sprayed water at him. Removed it and called respiratory. Applied non-humidified nasal canula at 4L. While nasal canula and non-rebreather off, pt immediately desaturated to 74%.

## 2019-11-01 NOTE — ED Provider Notes (Signed)
[facility] DEPT Provider Note   CSN: [id number] Arrival date & time: 11/03/2019  0744     History Chief Complaint  Patient presents with  . COVID 19  . Weakness    Levi Jones is a 69 y.o. male.  69 yo M with a chief complaints of generalized fatigue.  The patient works at a prison.  Had had a known Covid exposure about a week ago.  His initial 2 tests were negative.  He then went to the health department a couple days ago and tested positive.  Has had cough nausea vomiting and diarrhea.  He called 911 this morning due to just progressive fatigue.  He got to the point where he could not get up and move around.  He denies chest pain denies abdominal pain.  Has been able to eat and drink some.  The history is provided by the patient.  Weakness Associated symptoms: cough, diarrhea, nausea, shortness of breath and vomiting   Associated symptoms: no abdominal pain, no arthralgias, no chest pain, no fever, no headaches and no myalgias   Illness Severity:  Severe Onset quality:  Gradual Duration:  2 days Timing:  Constant Progression:  Worsening Chronicity:  New Associated symptoms: cough, diarrhea, nausea, shortness of breath and vomiting   Associated symptoms: no abdominal pain, no chest pain, no congestion, no fever, no headaches, no myalgias and no rash        Past Medical History:  Diagnosis Date  . Abnormal LFTs 07/01/2016  . ALLERGIC RHINITIS CAUSE UNSPECIFIED 03/05/2010   Qualifier: Diagnosis of  By: Stann Mainland CMA, Alida    . Anemia 07/02/2016  . Benign localized hyperplasia of prostate with urinary obstruction   . BPH (benign prostatic hyperplasia) 05/25/2018  . CAP (community acquired pneumonia) 07/01/2016  . 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 Hypertension   . Nocturia   .  Osteoarthritis   . Sarcoidosis    h/o, DX in the 90's to early 2000s (Dr.Simonds)  . Sciatica 09/27/2014    Patient Active Problem List   Diagnosis Date Noted  . BPH (benign prostatic hyperplasia) 05/25/2018  . Hyperlipidemia 08/26/2017  . Coronary artery calcification seen on CT scan 08/26/2017  . Abnormal nuclear stress test 07/13/2017  . Folliculitis barbae [id number]  . Anemia 07/02/2016  . Abnormal LFTs 07/01/2016  . PCP NOTES >>>>> 09/24/2015  . Sciatica 09/27/2014  . 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 Annual physical exam 07/30/2011  . ALLERGIC RHINITIS CAUSE UNSPECIFIED 03/05/2010  . Sarcoidosis 01/02/2010  . Essential hypertension 01/02/2010  . 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 Osteoarthritis 01/02/2010    Past Surgical History:  Procedure Laterality Date  . FOOT SURGERY Left     remotely, at [facility], podiatry.  Marland Kitchen KNEE ARTHROSCOPY     Right x2  . KNEE ARTHROSCOPY     Left x1       Family History  Problem Relation Age of Onset  . Lung cancer Father        smoker  . Coronary artery disease Mother 90       stents at age 80  . Hypertension Mother   . Heart failure Mother   . Prostate cancer Neg Hx   . Colon cancer Neg Hx   . Stroke Neg Hx  Social History   Tobacco Use  . Smoking status: Never Smoker  . Smokeless tobacco: Never Used  Substance Use Topics  . Alcohol use: No  . Drug use: No    Home Medications Prior to Admission medications   Medication Sig Start Date End Date Taking? Authorizing Provider  amLODipine (NORVASC) 10 MG tablet Take 1 tablet (10 mg total) by mouth daily. 09/01/19   Colon Branch, MD  aspirin EC 81 MG tablet Take 81 mg by mouth daily.    [provider]  atorvastatin (LIPITOR) 20 MG tablet Take 1 tablet (20 mg total) by mouth at bedtime. 06/03/19   Colon Branch, MD  hydrocortisone 2.5 % cream Apply topically 2 (two) times daily as needed. 09/26/19   Colon Branch, MD  ibuprofen (ADVIL,MOTRIN) 800 MG tablet TAKE 1 TABLET (800 MG TOTAL) BY MOUTH DAILY AS NEEDED FOR PAIN. TAKE WITH FOOD. Patient not taking: Reported on 09/28/2019 07/14/18   Colon Branch, MD  loratadine (CLARITIN) 10 MG tablet Take 1 tablet (10 mg total) by mouth daily as needed for allergies. 12/13/18   Colon Branch, MD  losartan (COZAAR) 100 MG tablet Take 1 tablet (100 mg total) by mouth daily. 09/01/19   Colon Branch, MD  nystatin-triamcinolone ointment [facility]) Apply 1 application topically 2 (two) times daily as needed. Use sparingly 06/01/19   Colon Branch, MD  PRESCRIPTION MEDICATION NAME - C ONYCHOMYCOSIS COMPIUND MEDICATION Apply to affected toes and nails daily as directed by MD. 08/06/17   [provider]    Allergies    Patient has no known allergies.  Review of Systems   Review of Systems  Constitutional: Negative for chills and fever.  HENT: Negative for congestion and facial swelling.   Eyes: Negative for discharge and visual disturbance.  Respiratory: Positive for cough and shortness of breath.   Cardiovascular: Negative for chest pain and palpitations.  Gastrointestinal: Positive for diarrhea, nausea and vomiting. Negative for abdominal pain.  Musculoskeletal: Negative for arthralgias and myalgias.  Skin: Negative for color change and rash.  Neurological: Positive for weakness (generalized). Negative for tremors, syncope and headaches.  Psychiatric/Behavioral: Negative for confusion and dysphoric mood.    Physical Exam Updated Vital Signs BP 137/69   Pulse 90   Temp 99.3 F (37.4 C) (Oral)   Resp (!) 23   SpO2 (!) 85%   Physical Exam Vitals and nursing note reviewed.  Constitutional:      Appearance: He is well-developed.  HENT:     Head: Normocephalic and atraumatic.  Eyes:     Pupils: Pupils are equal, round, and reactive to light.  Neck:     Vascular: No JVD.  Cardiovascular:     Rate and Rhythm: Regular rhythm. Tachycardia present.     Heart  sounds: No murmur. No friction rub. No gallop.   Pulmonary:     Effort: No respiratory distress.     Breath sounds: No wheezing.     Comments: tachypnea Abdominal:     General: There is no distension.     Tenderness: There is no abdominal tenderness. There is no guarding or rebound.  Musculoskeletal:        General: Normal range of motion.     Cervical back: Normal range of motion and neck supple.  Skin:    Coloration: Skin is not pale.     Findings: No rash.  Neurological:     Mental Status: He is alert and oriented to  person, place, and time.  Psychiatric:        Behavior: Behavior normal.     ED Results / Procedures / Treatments   Labs (all labs ordered are listed, but only abnormal results are displayed) Labs Reviewed  LACTIC ACID, PLASMA - Abnormal; Notable for the following components:      Result Value   Lactic Acid, Venous 3.2 (*)    All other components within normal limits  CBC WITH DIFFERENTIAL/PLATELET - Abnormal; Notable for the following components:   WBC 10.6 (*)    Hemoglobin 12.0 (*)    HCT 37.9 (*)    nRBC 1.0 (*)    Neutro Abs 8.9 (*)    Abs Immature Granulocytes 0.33 (*)    All other components within normal limits  COMPREHENSIVE METABOLIC PANEL - Abnormal; Notable for the following components:   Glucose, Bld 203 (*)    Creatinine, Ser 1.43 (*)    Calcium 8.4 (*)    AST 119 (*)    ALT 114 (*)    Total Bilirubin 1.7 (*)    GFR calc non Af Amer 50 (*)    GFR calc Af Amer 58 (*)    All other components within normal limits  D-DIMER, QUANTITATIVE (NOT AT [facility]) - Abnormal; Notable for the following components:   D-Dimer, Quant 1.62 (*)    All other components within normal limits  LACTATE DEHYDROGENASE - Abnormal; Notable for the following components:   LDH 911 (*)    All other components within normal limits  FIBRINOGEN - Abnormal; Notable for the following components:   Fibrinogen 772 (*)    All other components within normal limits  POC SARS  CORONAVIRUS 2 AG -  ED - Abnormal; Notable for the following components:   SARS Coronavirus 2 Ag POSITIVE (*)    All other components within normal limits  CULTURE, BLOOD (ROUTINE X 2)  CULTURE, BLOOD (ROUTINE X 2)  TRIGLYCERIDES  LACTIC ACID, PLASMA  PROCALCITONIN  FERRITIN  C-REACTIVE PROTEIN    EKG EKG Interpretation  Date/Time:  Tuesday November 01 2019 08:44:54 EST Ventricular Rate:  95 PR Interval:    QRS Duration: 96 QT Interval:  431 QTC Calculation: 542 R Axis:   -6 Text Interpretation: Sinus rhythm Probable left atrial enlargement Abnormal R-wave progression, early transition LVH with secondary repolarization abnormality Prolonged QT interval Since last tracing rate faster Otherwise no significant change Confirmed by Deno Etienne [phone]) on 11/22/2019 8:52:12 AM   Radiology DG Chest Port 1 View  Result Date: 11/16/2019 CLINICAL DATA:  Shortness of breath, worsening COVID symptoms, decreased O2 sats. EXAM: PORTABLE CHEST 1 VIEW COMPARISON:  Chest radiograph 04/30/2017 FINDINGS: Heart size at the upper limits of normal. Ill-defined bilateral airspace opacities with a mid to lower lung predominance. No sizable pleural effusion or evidence of pneumothorax. No acute bony abnormality. IMPRESSION: Bilateral ill-defined airspace opacities with a mid to lower lung predominance. Findings likely reflect atypical/viral pneumonia given provided history of COVID. Radiographic follow-up to resolution recommended. Electronically Signed   By: Kellie Simmering DO   On: 11/19/2019 08:33    Procedures Procedures (including critical care time)  Medications Ordered in ED Medications  chlorpheniramine-HYDROcodone (TUSSIONEX) 10-8 MG/5ML suspension 5 mL (5 mLs Oral Given 10/30/2019 0842)  sodium chloride 0.9 % bolus 1,000 mL (1,000 mLs Intravenous New Bag/Given 11/15/2019 0842)  ketorolac (TORADOL) 30 MG/ML injection 15 mg (15 mg Intravenous Given 11/24/2019 0842)  ondansetron (ZOFRAN) injection 4 mg (4 mg  Intravenous Given 10/28/2019  0842)    ED Course  I have reviewed the triage vital signs and the nursing notes.  Pertinent labs & imaging results that were available during my care of the patient were reviewed by me and considered in my medical decision making (see chart for details).    MDM Rules/Calculators/A&P                      69 yo M with a chief complaint of cough congestion fever chills and myalgias.  Going on for about 4 to 5 days now.  Patient had gotten so fatigued she could not get up and move around.  Found to be hypoxic into the 30s by EMS.  Requiring nonrebreather.  Patient had a recent coronavirus test that was positive.  Suspect this is causing all of his symptoms.  Will use the order set to evaluate.  Lik[facility] to [facility].  Lactic elevated.  Covid +.  ddimer mildly elevated. Mild LFT elevation. AKI.  Patient was placed on high flow nasal cannula, also requiring nonrebreather.  CRITICAL CARE Performed by: Cecilio Asper   Total critical care time: 35 minutes  Critical care time was exclusive of separately billable procedures and treating other patients.  Critical care was necessary to treat or prevent imminent or life-threatening deterioration.  Critical care was time spent personally by me on the following activities: development of treatment plan with patient and/or surrogate as well as nursing, discussions with consultants, evaluation of patient's response to treatment, examination of patient, obtaining history from patient or surrogate, ordering and performing treatments and interventions, ordering and review of laboratory studies, ordering and review of radiographic studies, pulse oximetry and re-evaluation of patient's condition.  The patients results and plan were reviewed and discussed.   Any x-rays performed were independently reviewed by myself.   Differential diagnosis were considered with the presenting HPI.  Medications   chlorpheniramine-HYDROcodone (TUSSIONEX) 10-8 MG/5ML suspension 5 mL (5 mLs Oral Given 11/23/2019 0842)  sodium chloride 0.9 % bolus 1,000 mL (1,000 mLs Intravenous New Bag/Given 11/24/2019 0842)  ketorolac (TORADOL) 30 MG/ML injection 15 mg (15 mg Intravenous Given 10/31/2019 0842)  ondansetron (ZOFRAN) injection 4 mg (4 mg Intravenous Given 11/12/2019 0842)    Vitals:   11/05/2019 0831 11/19/2019 0900 11/16/2019 0915 10/31/2019 0943  BP: 132/72 126/67 137/69   Pulse: 98 95 (!) 101 90  Resp: 20 (!) 35 (!) 24 (!) 23  Temp: 99.3 F (37.4 C)     TempSrc: Oral     SpO2: (!) 89% (!) 84% (!) 65% (!) 85%    Final diagnoses:  COVID-19 virus infection  Acute respiratory failure with hypoxia (HCC)    Admission/ observation were discussed with the admitting physician, patient and/or family and they are comfortable with the plan.    Final Clinical Impression(s) / ED Diagnoses Final diagnoses:  COVID-19 virus infection  Acute respiratory failure with hypoxia [facility])    Rx / DC Orders ED Discharge Orders    None       Deno Etienne, DO 10/29/2019 1004

## 2019-11-01 NOTE — Progress Notes (Signed)
Pt placed on Heated High Flow at 35L, 100%.  Failed to maintain sats above 85%, non rebreather added to HFNC with sats of 95%.  Dr Tyrone Nine aware.

## 2019-11-01 NOTE — ED Notes (Signed)
Messaged pharmacy for lovonox

## 2019-11-01 NOTE — ED Triage Notes (Signed)
Transported by [facility] from home-- tested positive for COVID on Christmas Eve. +fever, chills, weakness; On room air patient's saturation 50% and on NRB 84-94% on 15 L. Patient is AAO x 4.

## 2019-11-01 NOTE — ED Notes (Signed)
Date and time results received: 11/16/2019 9:55 AM  (use smartphrase ".now" to insert current time)  Test: Lactic Critical Value: 3.2  Name of Provider Notified: Tyrone Nine  Orders Received? Or Actions Taken?: Orders Received - See Orders for details

## 2019-11-01 NOTE — H&P (Addendum)
History and Physical        Hospital Admission Note Date: 11/19/2019  Patient name: Levi Jones Medical record number: [id number] Date of birth: 06-07-1951 Age: 69 y.o. Gender: male  PCP: Colon Branch, MD    Patient coming from: Home  I have reviewed all records in the [facility].    Chief Complaint:  Generalized fatigue with shortness of breath, worsening in the last 2 days  HPI: Patient is a 69 year old male with a history of sarcoidosis, hypertension, hyperlipidemia, BPH presented to ED with worsening generalized fatigue, shortness of breath.  Patient reported that he was exposed to to Covid on December 22nd at work, tested positive on December 25th.  Patient reported that initially he he felt tired, had coughing, nausea, vomiting and diarrhea.  However in the last 2 to 3 days he started having shortness of breath and progressive fatigue.  This morning he called 911, due to difficulty breathing, he could not get up and move around due to weakness.  Otherwise denied any chest pain.  Reported low-grade fevers at home.  Patient reports that he has been able to eat and drink.  COVID-19 test positive  ED work-up/course:  Temp 99.3, respiratory rate 20-35, pulse 95, BP 126/67, Oxygen saturations in the ED were 50% on arrival. O2 sats 84 to 85% on high flow O2, 35 L and failed to maintain sats above 85%, nonrebreather was added to high flow with sats of 95%. Sodium 139, BUN 17, creatinine 1.43, AST 119, ALT 114, LDH 911, ferritin 4398, lactic acid 3.2, procalcitonin 0.2, WBCs 10.6, hemoglobin 12.0  Chest x-ray showed bilateral ill-defined airspace opacities with mid to lower lung predominance consistent with atypical/viral pneumonia  Review of Systems: Positives marked in 'bold' Constitutional: +  fever, chills, diaphoresis, poor appetite and fatigue.  HEENT: Denies  photophobia, eye pain, redness, hearing loss, ear pain, congestion, sore throat, rhinorrhea, sneezing, mouth sores, trouble swallowing, neck pain, neck stiffness and tinnitus.   Respiratory: Respiratory distress with shortness of breath, dyspnea on exertion, chest tightness, coughing Cardiovascular: Denies chest pain, palpitations and leg swelling.  Gastrointestinal: Denies nausea, vomiting, abdominal pain, diarrhea, constipation, blood in stool and abdominal distention.  Genitourinary: Denies dysuria, urgency, frequency, hematuria, flank pain and difficulty urinating.  Musculoskeletal: + Myalgias, no back pain, joint swelling, arthralgias.   Skin: Denies pallor, rash and wound.  Neurological: Feeling very weak Hematological: Denies adenopathy. Easy bruising, personal or family bleeding history  Psychiatric/Behavioral: Denies suicidal ideation, mood changes, confusion, nervousness, sleep disturbance and agitation  Past Medical History: Past Medical History:  Diagnosis Date  . Abnormal LFTs 07/01/2016  . ALLERGIC RHINITIS CAUSE UNSPECIFIED 03/05/2010   Qualifier: Diagnosis of  By: Stann Mainland CMA, [place]    . Anemia 07/02/2016  . Benign localized hyperplasia of prostate with urinary obstruction   . BPH (benign prostatic hyperplasia) 05/25/2018  . CAP (community acquired pneumonia) 07/01/2016  . 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 Hypertension   . Nocturia   . Osteoarthritis   .  Sarcoidosis    h/o, DX in the 90's to early 2000s (Dr.Simonds)  . Sciatica 09/27/2014    Past Surgical History:  Procedure Laterality Date  . FOOT SURGERY Left     remotely, at [facility], podiatry.  Marland Kitchen KNEE ARTHROSCOPY     Right x2  . KNEE ARTHROSCOPY     Left x1    Medications: Prior to Admission medications   Medication Sig Start Date End Date Taking?  Authorizing Provider  acetaminophen (TYLENOL) 500 MG tablet Take 1,000 mg by mouth every 6 (six) hours as needed for fever.   Yes [provider]  amLODipine (NORVASC) 10 MG tablet Take 1 tablet (10 mg total) by mouth daily. Patient taking differently: Take 20 mg by mouth daily.  09/01/19  Yes Colon Branch, MD  aspirin EC 81 MG tablet Take 81 mg by mouth daily.   Yes [provider]  atorvastatin (LIPITOR) 20 MG tablet Take 1 tablet (20 mg total) by mouth at bedtime. 06/03/19  Yes Paz, Alda Berthold, MD  hydrocortisone 2.5 % cream Apply topically 2 (two) times daily as needed. 09/26/19  Yes Paz, Alda Berthold, MD  loratadine (CLARITIN) 10 MG tablet Take 1 tablet (10 mg total) by mouth daily as needed for allergies. 12/13/18  Yes Paz, Alda Berthold, MD  losartan (COZAAR) 100 MG tablet Take 1 tablet (100 mg total) by mouth daily. 09/01/19  Yes Paz, Alda Berthold, MD  nystatin-triamcinolone ointment [facility]) Apply 1 application topically 2 (two) times daily as needed. Use sparingly 06/01/19  Yes Colon Branch, MD    Allergies:  No Known Allergies  Social History:  reports that he has never smoked. He has never used smokeless tobacco. He reports that he does not drink alcohol or use drugs.  Family History: Family History  Problem Relation Age of Onset  . Lung cancer Father        smoker  . Coronary artery disease Mother 79       stents at age 68  . Hypertension Mother   . Heart failure Mother   . Prostate cancer Neg Hx   . Colon cancer Neg Hx   . Stroke Neg Hx     Physical Exam: Blood pressure 137/69, pulse 90, temperature 99.3 F (37.4 C), temperature source Oral, resp. rate (!) 23, SpO2 (!) 85 %. General: Alert, awake, oriented x3, in moderate respiratory distress, on high flow O2 and nonrebreather mask, still able to complete full sentences Eyes: pink conjunctiva,anicteric sclera, pupils equal and reactive to light and accomodation, HEENT: normocephalic, atraumatic, oropharynx clear Neck: supple,  no masses or lymphadenopathy, no goiter, no bruits, no JVD CVS: Regular rate and rhythm, without murmurs, rubs or gallops. No lower extremity edema Resp : Decreased breath sound at the bases GI : Soft, nontender, nondistended, positive bowel sounds, no masses. No hepatomegaly. No hernia.  Musculoskeletal: No clubbing or cyanosis, positive pedal pulses. No contracture. ROM intact  Neuro: Grossly intact, no focal neurological deficits, strength 5/5 upper and lower extremities bilaterally Psych: alert and oriented x 3, normal mood and affect Skin: no rashes or lesions, warm and dry   LABS on Admission: I have personally reviewed all the labs and imagings below    Basic Metabolic Panel: Recent Labs  Lab 10/28/2019 0844  NA 139  K 3.9  CL 101  CO2 25  GLUCOSE 203*  BUN 17  CREATININE 1.43*  CALCIUM 8.4*   Liver Function Tests: Recent Labs  Lab 11/12/2019 0844  AST  119*  ALT 114*  ALKPHOS 64  BILITOT 1.7*  PROT 7.5  ALBUMIN 3.5   No results for input(s): LIPASE, AMYLASE in the last 168 hours. No results for input(s): AMMONIA in the last 168 hours. CBC: Recent Labs  Lab 11/20/2019 0844  WBC 10.6*  NEUTROABS 8.9*  HGB 12.0*  HCT 37.9*  MCV 88.3  PLT 228   Cardiac Enzymes: No results for input(s): CKTOTAL, CKMB, CKMBINDEX, TROPONINI in the last 168 hours. BNP: Invalid input(s): POCBNP CBG: No results for input(s): GLUCAP in the last 168 hours.  Radiological Exams on Admission:  DG Chest Port 1 View  Result Date: 11/23/2019 CLINICAL DATA:  Shortness of breath, worsening COVID symptoms, decreased O2 sats. EXAM: PORTABLE CHEST 1 VIEW COMPARISON:  Chest radiograph 04/30/2017 FINDINGS: Heart size at the upper limits of normal. Ill-defined bilateral airspace opacities with a mid to lower lung predominance. No sizable pleural effusion or evidence of pneumothorax. No acute bony abnormality. IMPRESSION: Bilateral ill-defined airspace opacities with a mid to lower lung predominance.  Findings likely reflect atypical/viral pneumonia given provided history of COVID. Radiographic follow-up to resolution recommended. Electronically Signed   By: Kellie Simmering DO   On: 11/26/2019 08:33      EKG: Independently reviewed. rate  95 normal sinus rhythm, prolonged QTC 542   Assessment/Plan Principal Problem: Acute hypoxic respiratory failure due to acute COVID-19 viral pneumonia during the ongoing 2020 COVID-19 pandemic- POA - Patient presented with progressive generalized fatigue, weakness, coughing, worsening shortness of breath and profound hypoxia.  Chest x-ray showed multifocal pneumonia - Currently hypoxic, on 35 L heated high flow, NRB sats 95% -Started on management: Decadron 6 mg IV daily, Remdesivir per pharmacy protocol  -Inflammatory markers elevated, will give Actemra x1.  Discussed in detail with the patient, denies any history of TB or immunosuppression, wants to proceed with tocilizumab.  Procalcitonin 0.2.  Discussed in detail with the patient regarding convalescent plasma, he agrees to proceed with convalescent plasma if available. - Continue Supportive care: vitamin C/zinc, albuterol, Tylenol. - Continue to wean oxygen, ambulatory O2 screening daily as tolerated  - Oxygen - SpO2: (!) 85 % O2 Flow Rate (L/min): 30 L/min FiO2 (%): 100 %  -Pulmonary critical care consulted, discussed with Dr. Loanne Drilling - Continue to follow labs as below    Recent Labs  Lab 11/06/2019 0844  DDIMER 1.62*  FERRITIN 4,398*  CRP 18.5*  ALT 114*  PROCALCITON 0.20     Active Problems: Acute kidney injury with lactic acidosis -Patient has had nausea vomiting and diarrhea, acute COVID-19.  Patient has received 1 L of IV fluids in ED.  -Hold losartan -Follow bmet   history of Sarcoidosis -On chart review, patient appears to have history of sarcoidosis, diagnosed in 90s to early 2000s, Dr. Alva Garnet, does not appear to be on any medication or steroids.    Essential  hypertension -BP currently stable, continue amlodipine, hold losartan due to AKI    Hyperlipidemia -LFTs elevated, hold Lipitor for now, will restart once improving   DVT prophylaxis: Lovenox  CODE STATUS: Discussed with the patient in detail, full CODE STATUS  Consults called: Pulmonary critical care, discussed with Dr. Loanne Drilling  Family Communication: Admission, patients condition and plan of care including tests being ordered have been discussed with the patient  who indicates understanding and agree with the plan and Code Status  Admission status:   The medical decision making on this patient was of high complexity and the patient is at high risk  for clinical deterioration, therefore this is a level 3 admission.  Severity of Illness:     The appropriate patient status for this patient is INPATIENT. Inpatient status is judged to be reasonable and necessary in order to provide the required intensity of service to ensure the patient's safety. The patient's presenting symptoms, physical exam findings, and initial radiographic and laboratory data in the context of their chronic comorbidities is felt to place them at high risk for further clinical deterioration. Furthermore, it is not anticipated that the patient will be medically stable for discharge from the hospital within 2 midnights of admission. The following factors support the patient status of inpatient.   " The patient's presenting symptoms include acute respiratory failure with hypoxia, O2 sats in 50s at the time of arrival, COVID-19 " The worrisome physical exam findings include respiratory distress with hypoxia " The initial radiographic and laboratory data are worrisome because of COVID-19 infection, elevated inflammatory markers, acute kidney injury with lactic acidosis " The chronic co-morbidities include hypertension, hyperlipidemia   * I certify that at the point of admission it is my clinical judgment that the patient will  require inpatient hospital care spanning beyond 2 midnights from the point of admission due to high intensity of service, high risk for further deterioration and high frequency of surveillance required.*    Time Spent on Admission: 70 mins      Piercen Covino M.D. [facility] 10/30/2019, 10:37 AM

## 2019-11-01 NOTE — ED Notes (Signed)
Contacted Respiratory to inform about transitioning the patient to high flow nasal cannula oxygen.

## 2019-11-02 ENCOUNTER — Encounter (HOSPITAL_COMMUNITY): Payer: Self-pay | Admitting: Internal Medicine

## 2019-11-02 ENCOUNTER — Other Ambulatory Visit: Payer: Self-pay

## 2019-11-02 DIAGNOSIS — N179 Acute kidney failure, unspecified: Secondary | ICD-10-CM

## 2019-11-02 DIAGNOSIS — N189 Chronic kidney disease, unspecified: Secondary | ICD-10-CM

## 2019-11-02 DIAGNOSIS — N400 Enlarged prostate without lower urinary tract symptoms: Secondary | ICD-10-CM

## 2019-11-02 DIAGNOSIS — E785 Hyperlipidemia, unspecified: Secondary | ICD-10-CM

## 2019-11-02 DIAGNOSIS — J1282 Pneumonia due to coronavirus disease 2019: Secondary | ICD-10-CM

## 2019-11-02 DIAGNOSIS — J9600 Acute respiratory failure, unspecified whether with hypoxia or hypercapnia: Secondary | ICD-10-CM

## 2019-11-02 DIAGNOSIS — U071 COVID-19: Principal | ICD-10-CM

## 2019-11-02 LAB — CBC WITH DIFFERENTIAL/PLATELET
Abs Immature Granulocytes: 0.41 10*3/uL — ABNORMAL HIGH (ref 0.00–0.07)
Basophils Absolute: 0.1 10*3/uL (ref 0.0–0.1)
Basophils Relative: 1 %
Eosinophils Absolute: 0 10*3/uL (ref 0.0–0.5)
Eosinophils Relative: 0 %
HCT: 37.4 % — ABNORMAL LOW (ref 39.0–52.0)
Hemoglobin: 11.4 g/dL — ABNORMAL LOW (ref 13.0–17.0)
Immature Granulocytes: 4 %
Lymphocytes Relative: 11 %
Lymphs Abs: 1 10*3/uL (ref 0.7–4.0)
MCH: 27.5 pg (ref 26.0–34.0)
MCHC: 30.5 g/dL (ref 30.0–36.0)
MCV: 90.1 fL (ref 80.0–100.0)
Monocytes Absolute: 0.6 10*3/uL (ref 0.1–1.0)
Monocytes Relative: 6 %
Neutro Abs: 7.4 10*3/uL (ref 1.7–7.7)
Neutrophils Relative %: 78 %
Platelets: 241 10*3/uL (ref 150–400)
RBC: 4.15 MIL/uL — ABNORMAL LOW (ref 4.22–5.81)
RDW: 12.5 % (ref 11.5–15.5)
WBC: 9.5 10*3/uL (ref 4.0–10.5)
nRBC: 1.6 % — ABNORMAL HIGH (ref 0.0–0.2)

## 2019-11-02 LAB — COMPREHENSIVE METABOLIC PANEL
ALT: 117 U/L — ABNORMAL HIGH (ref 0–44)
AST: 133 U/L — ABNORMAL HIGH (ref 15–41)
Albumin: 3.2 g/dL — ABNORMAL LOW (ref 3.5–5.0)
Alkaline Phosphatase: 72 U/L (ref 38–126)
Anion gap: 13 (ref 5–15)
BUN: 21 mg/dL (ref 8–23)
CO2: 21 mmol/L — ABNORMAL LOW (ref 22–32)
Calcium: 8.2 mg/dL — ABNORMAL LOW (ref 8.9–10.3)
Chloride: 105 mmol/L (ref 98–111)
Creatinine, Ser: 1.1 mg/dL (ref 0.61–1.24)
GFR calc Af Amer: >60 mL/min (ref >60)
GFR calc non Af Amer: >60 mL/min (ref >60)
Glucose, Bld: 219 mg/dL — ABNORMAL HIGH (ref 70–99)
Potassium: 4.3 mmol/L (ref 3.5–5.1)
Sodium: 139 mmol/L (ref 135–145)
Total Bilirubin: 1 mg/dL (ref 0.3–1.2)
Total Protein: 7.3 g/dL (ref 6.5–8.1)

## 2019-11-02 LAB — BPAM FFP
Blood Product Expiration Date: 202101061358
ISSUE DATE / TIME: 202101051913
Unit Type and Rh: 7300

## 2019-11-02 LAB — CBG MONITORING, ED: Glucose-Capillary: 217 mg/dL — ABNORMAL HIGH (ref 70–99)

## 2019-11-02 LAB — PREPARE FRESH FROZEN PLASMA: Unit division: 0

## 2019-11-02 LAB — C-REACTIVE PROTEIN: CRP: 19.7 mg/dL — ABNORMAL HIGH (ref <1.0)

## 2019-11-02 LAB — FERRITIN: Ferritin: 2701 ng/mL — ABNORMAL HIGH (ref 24–336)

## 2019-11-02 LAB — D-DIMER, QUANTITATIVE: D-Dimer, Quant: 1.9 ug/mL-FEU — ABNORMAL HIGH (ref 0.00–0.50)

## 2019-11-02 MED ORDER — INSULIN ASPART 100 UNIT/ML ~~LOC~~ SOLN
0.0000 [IU] | Freq: Every day | SUBCUTANEOUS | Status: DC
  Administered 2019-11-03: 4 [IU] via SUBCUTANEOUS
  Filled 2019-11-02: qty 0.05

## 2019-11-02 MED ORDER — IPRATROPIUM BROMIDE HFA 17 MCG/ACT IN AERS
2.0000 | INHALATION_SPRAY | RESPIRATORY_TRACT | Status: DC
  Administered 2019-11-02: 2 via RESPIRATORY_TRACT
  Filled 2019-11-02: qty 12.9

## 2019-11-02 MED ORDER — ENOXAPARIN SODIUM 60 MG/0.6ML ~~LOC~~ SOLN
60.0000 mg | SUBCUTANEOUS | Status: AC
  Administered 2019-11-02: 60 mg via SUBCUTANEOUS
  Filled 2019-11-02: qty 0.6

## 2019-11-02 MED ORDER — CHLORHEXIDINE GLUCONATE CLOTH 2 % EX PADS
6.0000 | MEDICATED_PAD | Freq: Every day | CUTANEOUS | Status: DC
  Administered 2019-11-02 – 2019-11-16 (×15): 6 via TOPICAL

## 2019-11-02 MED ORDER — ATORVASTATIN CALCIUM 10 MG PO TABS
20.0000 mg | ORAL_TABLET | Freq: Every day | ORAL | Status: DC
  Administered 2019-11-02 – 2019-11-08 (×7): 20 mg via ORAL
  Filled 2019-11-02 (×6): qty 2
  Filled 2019-11-02: qty 1
  Filled 2019-11-02: qty 2

## 2019-11-02 MED ORDER — METHYLPREDNISOLONE SODIUM SUCC 125 MG IJ SOLR
60.0000 mg | Freq: Two times a day (BID) | INTRAMUSCULAR | Status: DC
  Administered 2019-11-02 – 2019-11-05 (×7): 60 mg via INTRAVENOUS
  Filled 2019-11-02 (×7): qty 2

## 2019-11-02 MED ORDER — IPRATROPIUM BROMIDE HFA 17 MCG/ACT IN AERS
2.0000 | INHALATION_SPRAY | Freq: Four times a day (QID) | RESPIRATORY_TRACT | Status: DC
  Administered 2019-11-02 – 2019-11-09 (×29): 2 via RESPIRATORY_TRACT
  Filled 2019-11-02 (×2): qty 12.9

## 2019-11-02 MED ORDER — ENOXAPARIN SODIUM 120 MG/0.8ML ~~LOC~~ SOLN
120.0000 mg | Freq: Two times a day (BID) | SUBCUTANEOUS | Status: DC
  Administered 2019-11-02: 120 mg via SUBCUTANEOUS
  Filled 2019-11-02 (×3): qty 0.8

## 2019-11-02 MED ORDER — ASPIRIN EC 81 MG PO TBEC
81.0000 mg | DELAYED_RELEASE_TABLET | Freq: Every day | ORAL | Status: DC
  Administered 2019-11-02 – 2019-11-09 (×8): 81 mg via ORAL
  Filled 2019-11-02 (×8): qty 1

## 2019-11-02 MED ORDER — INSULIN ASPART 100 UNIT/ML ~~LOC~~ SOLN
0.0000 [IU] | Freq: Three times a day (TID) | SUBCUTANEOUS | Status: DC
  Administered 2019-11-02: 18:00:00 4 [IU] via SUBCUTANEOUS
  Administered 2019-11-02 – 2019-11-03 (×2): 7 [IU] via SUBCUTANEOUS
  Administered 2019-11-03: 14:00:00 11 [IU] via SUBCUTANEOUS
  Filled 2019-11-02: qty 0.2

## 2019-11-02 NOTE — ED Notes (Signed)
Unable to collect blood, phlebotomy called.

## 2019-11-02 NOTE — Progress Notes (Signed)
Pt transported to [facility] ICU/SD. Per ED nurse during report pt was not stable enough to take to CT d/t oxygen requirements. MD came and saw pt and is aware. Pt on 40L HFNC FiO2 100%. Will continue to monitor.

## 2019-11-02 NOTE — Progress Notes (Signed)
Patient placed on HHFNC and titrated to maintain O2 sats of 88-91%; patient currently on 35L and 100% FiO2. RT instructed patient on the use of IS and administered MDI. RT will continue to monitor patient and wean as tolerated.

## 2019-11-02 NOTE — Progress Notes (Signed)
PROGRESS NOTE  Levi Jones [id number] DOB: [birth year]   PCP: Colon Branch, MD  Patient is from: Home  DOA: 10/28/2019 LOS: 1  Brief Narrative / Interim history: 69 year old male with history of sarcoidosis, HTN, HLD and BPH brought to ED by EMS due to worsening fatigue and shortness of breath for 2 to 3 days.  Tested positive for COVID-19 on 12/25.  In ED, COVID-19 test positive.  Desaturated to 50% on arrival.  Saturation improved to 85% on 35 L HFNC.  Saturation improved further to 95% with addition of nonrebreather.  Slightly elevated LFTs.  Elevated inflammatory markers.  Lactic acid 3.2 but improved.  Procalcitonin 0.2.  CXR with bilateral ill-defined airspace opacities.  Admitted for acute respiratory failure due to COVID-19 pneumonia.  Started on Actemra, remdesivir and Decadron.  PCCM consulted.  Subjective: Saturation fluctuates between mid 80s and mid 90s on 35 L by HFNC.  Has no complaints.  He denies shortness of breath, chest pain, nausea, vomiting, diarrhea or UTI symptoms.  Objective: Vitals:   11/02/19 0937 11/02/19 0938 11/02/19 1000 11/02/19 1010  BP:   130/75   Pulse: 90 89 86 87  Resp: (!) 36 (!) 27 (!) 44 (!) 42  Temp:      TempSrc:      SpO2: (!) 88% (!) 86% (!) 86% (!) 88%  Weight:        Intake/Output Summary (Last 24 hours) at 11/02/2019 1051 Last data filed at 11/24/2019 1925 Gross per 24 hour  Intake 244 ml  Output --  Net 244 ml   Filed Weights   10/31/2019 1000  Weight: 115.3 kg    Examination:  GENERAL: No acute distress.  Appears well.  HEENT: MMM.  Vision and hearing grossly intact.  NECK: Supple.  No apparent JVD.  RESP: 87 to 95% on 36 L by HFNC.  No IWOB.  Crackles bilaterally. CVS:  RRR. Heart sounds normal.  ABD/GI/GU: Bowel sounds present. Soft. Non tender.  MSK/EXT:  Moves extremities. No apparent deformity. No edema.  SKIN: no apparent skin lesion or wound NEURO: Awake, alert and oriented appropriately.  No apparent focal  neuro deficit. PSYCH: Calm. Normal affect.   Procedures:  None  Assessment & Plan: Acute respiratory failure with hypoxia due to COVID-19 pneumonia: desaturated to 50% on room air on arrival.  CXR with bilateral opacities.  Inflammatory markers elevated.  Saturation fluctuates from 87 to 95% on 36 L by HFNC. Recent Labs    11/25/2019 0844 11/02/19 0412  DDIMER 1.62* 1.90*  FERRITIN 4,398* 2,701*  LDH 911*  --   CRP 18.5* 19.7*  -Received Actemra -Continue remdesivir. Change Decadron to Solu-Medrol -Therapeutic Lovenox until we exclude PE-CTA chest when able to obtain -Continue inhalers, mucolytic's, antitussive and incentive spirometry -Proning as much as possible -Appreciate PCCM guidance-recommended goal saturation of 85 to 95%.   AKI on CKD-3a -Cr 1.2-1.4 (baseline) >1.46 (admit)> 1.1.  Raises question of CKD-3.  History of sarcoidosis: Does not appear to be on medication for this. -Steroid as above could help  Essential hypertension: Normotensive. -Continue home amlodipine -Hold home losartan in the setting of AKI.  BPH: No LUTS: Not on medication. -Monitor urine output  HLD -Continue home statin.                 DVT prophylaxis: On therapeutic Lovenox Code Status: Full code Family Communication: Updated patient's wife over the phone. Disposition Plan: Remains inpatient due to significant hypoxia Consultants: PCCM   Microbiology  summarized: POC COVID-19 test positive. Blood culture pending.  Sch Meds:  Scheduled Meds: . albuterol  2 puff Inhalation Q6H  . amLODipine  10 mg Oral Daily  . vitamin C  500 mg Oral Daily  . aspirin EC  81 mg Oral Daily  . atorvastatin  20 mg Oral QHS  . dexamethasone (DECADRON) injection  6 mg Intravenous Q24H  . enoxaparin (LOVENOX) injection  120 mg Subcutaneous Q12H  . insulin aspart  0-20 Units Subcutaneous TID WC  . insulin aspart  0-5 Units Subcutaneous QHS  . loratadine  10 mg Oral Daily  . zinc sulfate  220  mg Oral Daily   Continuous Infusions: . remdesivir 100 mg in NS 100 mL     PRN Meds:.guaiFENesin-dextromethorphan  Antimicrobials: Anti-infectives (From admission, onward)   Start     Dose/Rate Route Frequency Ordered Stop   11/02/19 1000  remdesivir 100 mg in sodium chloride 0.9 % 100 mL IVPB  Status:  Discontinued     100 mg 200 mL/hr over 30 Minutes Intravenous Daily 11/21/2019 1838 11/20/2019 1847   11/02/19 1000  remdesivir 100 mg in sodium chloride 0.9 % 100 mL IVPB     100 mg 200 mL/hr over 30 Minutes Intravenous Daily 11/10/2019 1054 11/06/19 0959   11/07/2019 1838  remdesivir 200 mg in sodium chloride 0.9% 250 mL IVPB  Status:  Discontinued     200 mg 580 mL/hr over 30 Minutes Intravenous Once 11/19/2019 1838 10/31/2019 1847   10/30/2019 1200  remdesivir 200 mg in sodium chloride 0.9% 250 mL IVPB     200 mg 580 mL/hr over 30 Minutes Intravenous Once 11/05/2019 1054 11/27/2019 1302       I have personally reviewed the following labs and images: CBC: Recent Labs  Lab 11/04/2019 0844 11/02/19 0412  WBC 10.6* 9.5  NEUTROABS 8.9* 7.4  HGB 12.0* 11.4*  HCT 37.9* 37.4*  MCV 88.3 90.1  PLT 228 241   BMP &GFR Recent Labs  Lab 11/22/2019 0844 11/02/19 0412  NA 139 139  K 3.9 4.3  CL 101 105  CO2 25 21*  GLUCOSE 203* 219*  BUN 17 21  CREATININE 1.43* 1.10  CALCIUM 8.4* 8.2*   Estimated Creatinine Clearance: 84.2 mL/min (by C-G formula based on SCr of 1.1 mg/dL). Liver & Pancreas: Recent Labs  Lab 11/18/2019 0844 11/02/19 0412  AST 119* 133*  ALT 114* 117*  ALKPHOS 64 72  BILITOT 1.7* 1.0  PROT 7.5 7.3  ALBUMIN 3.5 3.2*   No results for input(s): LIPASE, AMYLASE in the last 168 hours. No results for input(s): AMMONIA in the last 168 hours. Diabetic: No results for input(s): HGBA1C in the last 72 hours. No results for input(s): GLUCAP in the last 168 hours. Cardiac Enzymes: No results for input(s): CKTOTAL, CKMB, CKMBINDEX, TROPONINI in the last 168 hours. No results for  input(s): PROBNP in the last 8760 hours. Coagulation Profile: No results for input(s): INR, PROTIME in the last 168 hours. Thyroid Function Tests: No results for input(s): TSH, T4TOTAL, FREET4, T3FREE, THYROIDAB in the last 72 hours. Lipid Profile: Recent Labs    11/06/2019 0844  TRIG 112   Anemia Panel: Recent Labs    10/31/2019 0844 11/02/19 0412  FERRITIN 4,398* 2,701*   Urine analysis:    Component Value Date/Time   COLORURINE YELLOW 12/14/2018 [place] 12/14/2018 0819   LABSPEC 1.025 12/14/2018 [place] 5.5 12/14/2018 [place] 12/14/2018 [id number]  HGBUR NEGATIVE 12/14/2018 0819   HGBUR negative 03/07/2010 1018   BILIRUBINUR SMALL (A) 12/14/2018 0819   BILIRUBINUR [place] 06/24/2016 1144   KETONESUR TRACE (A) 12/14/2018 0819   PROTEINUR NEGATIVE 07/01/2016 1620   UROBILINOGEN 0.2 12/14/2018 0819   NITRITE NEGATIVE 12/14/2018 0819   LEUKOCYTESUR NEGATIVE 12/14/2018 0819   Sepsis Labs: Invalid input(s): PROCALCITONIN, [place]  Microbiology: No results found for this or any previous visit (from the past 240 hour(s)).  Radiology Studies: No results found.   35 minutes with more than 50% spent in reviewing records, counseling patient/family and coordinating care.   Joud Ingwersen T. [place]  If 7PM-7AM, please contact night-coverage [email or web address] Password [facility] 11/02/2019, 10:51 AM

## 2019-11-02 NOTE — ED Notes (Signed)
He is not in any clinical distress. He is, however, hypoxemic. Megan, RT is here and evaluating him at this time.

## 2019-11-02 NOTE — ED Notes (Signed)
Wife would like a call back on how her husband is.

## 2019-11-02 NOTE — Progress Notes (Addendum)
ANTICOAGULATION CONSULT NOTE  Pharmacy Consult for enoxaparin Indication: r/o pulmonary embolism  No Known Allergies  Patient Measurements: Weight: 254 lb 3.1 oz (115.3 kg)   Vital Signs: BP: 129/70 (01/06 0700) Pulse Rate: 87 (01/06 0700)  Labs: Recent Labs    11/07/2019 0844 11/02/19 0412  HGB 12.0* 11.4*  HCT 37.9* 37.4*  PLT 228 241  CREATININE 1.43* 1.10    Estimated Creatinine Clearance: 84.2 mL/min (by C-G formula based on SCr of 1.1 mg/dL).   Medical History: Past Medical History:  Diagnosis Date  . Abnormal LFTs 07/01/2016  . ALLERGIC RHINITIS CAUSE UNSPECIFIED 03/05/2010   Qualifier: Diagnosis of  By: Stann Mainland CMA, [place]    . Anemia 07/02/2016  . Benign localized hyperplasia of prostate with urinary obstruction   . BPH (benign prostatic hyperplasia) 05/25/2018  . CAP (community acquired pneumonia) 07/01/2016  . 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 Hypertension   . Nocturia   . Osteoarthritis   . Sarcoidosis    h/o, DX in the 90's to early 2000s (Dr.Simonds)  . Sciatica 09/27/2014    Assessment: 69 y/o M admitted with COVID pneumonia, worsening dyspnea, D-dimer elevation.  Attending MD has ordered CT angio and pharmacy consult for enoxaparin for r/o PE pending CT angio results. Currently on enoxaparin 0.5 mg/kg (60 mg) q12h for intermediate-dose VTE prophylaxis, last given at 0150.   Goal of Therapy:  Full-dose enoxaparin pending CT angio results Monitor platelets by anticoagulation protocol: Yes   Plan:  Lovenox 60 mg SQ now (for total dose of 120 mg this AM) Lovenox 120 mg (1 mg/kg) SQ q12h, next dose 8 pm Follow up on CT angio result  Clayburn Pert, PharmD, BCPS 11/02/2019  7:59 AM

## 2019-11-02 NOTE — Progress Notes (Signed)
NAME:  Levi Jones, MRN:  [id number], DOB:  1951/05/23, LOS: 1 ADMISSION DATE:  11/20/2019, CONSULTATION DATE:  11/01/2019 REFERRING MD:  Dr. Tana Coast, [facility] CHIEF COMPLAINT:  Short of breath  Brief History   69 yo male with workplace exposure to [place] developed fatigue, weakness, cough, and dyspnea around Christmas day.  Symptoms progressed and he presented to ER.  CXR showed pneumonia, needing supplemental oxygen and COVID 19 positive.  Past Medical History  Sarcoidosis, OA, HTN, ED, BPH, Anemia, Allergies  Significant Hospital Events   1/05 Admit  Consults:    Procedures:    Significant Diagnostic Tests:    Micro Data:  SARS CoV2 Ag 1/05 >> Positive Blood 1/05 >>  COVID Therapy:  Decadron 1/05 >> Remdesivir 1/05 >> Tocilizumab 1/05  Convalescent plasma 1/05   Interim history/subjective:  Wants something to eat.  Denies chest pain, cough, wheeze, sputum, sinus congestion, sore throat, GI symptoms.  Still feels fatigued.  Objective   Blood pressure 128/66, pulse 89, temperature 99 F (37.2 C), temperature source Oral, resp. rate (!) 44, weight 115.3 kg, SpO2 (!) 87 %.    FiO2 (%):  [100 %] 100 %   Intake/Output Summary (Last 24 hours) at 11/02/2019 [id number] Last data filed at 11/13/2019 1925 Gross per 24 hour  Intake 244 ml  Output -  Net 244 ml   Filed Weights   11/13/2019 1000  Weight: 115.3 kg    Examination:  General - alert Eyes - pupils reactive ENT - no sinus tenderness, no stridor Cardiac - regular rate/rhythm, no murmur Chest - scattered rhonchi Abdomen - soft, non tender, + bowel sounds Extremities - no cyanosis, clubbing, or edema Skin - no rashes Neuro - normal strength, moves extremities, follows commands Psych - normal mood and behavior  CXR (reviewed by me) - b/l ASD  Resolved Hospital Problem list     Assessment & Plan:   Acute hypoxic respiratory failure from COVID 19 pneumonia. - day 2 of remdesivir and decadron - goal SpO2 85 to 95%  - prone positioning as able - bronchial hygiene - mobilize in room as able - continue zinc, vit C - f/u CXR intermittently - WOB acceptable at present >> no indication for mechanical ventilation at this time - prn BDs  Hx of HTN, HLD. - continue norvasc, ASA, lipitor  Allergic rhinitis. - continue claritin  Steroid induced hyperglycemia. - SSI  Best practice:  Diet: heart healthy DVT prophylaxis: lovenox GI prophylaxis: not indicated Mobility: OOB to chair Code Status: fulll code Disposition: SDU  Defer further management to hospitalist team.  Please call for assistance if his clinical status worsens.  Labs    CMP Latest Ref Rng & Units 11/02/2019 11/25/2019 09/28/2019  Glucose 70 - 99 mg/dL 219(H) 203(H) 124(H)  BUN 8 - 23 mg/dL 21 17 18   Creatinine 0.61 - 1.24 mg/dL 1.10 1.43(H) 1.29  Sodium 135 - 145 mmol/L 139 139 140  Potassium 3.5 - 5.1 mmol/L 4.3 3.9 3.9  Chloride 98 - 111 mmol/L 105 101 105  CO2 22 - 32 mmol/L 21(L) 25 25  Calcium 8.9 - 10.3 mg/dL 8.2(L) 8.4(L) 9.1  Total Protein 6.5 - 8.1 g/dL 7.3 7.5 7.0  Total Bilirubin 0.3 - 1.2 mg/dL 1.0 1.7(H) 1.0  Alkaline Phos 38 - 126 U/L 72 64 42  AST 15 - 41 U/L 133(H) 119(H) 16  ALT 0 - 44 U/L 117(H) 114(H) 19    CBC Latest Ref Rng & Units 11/02/2019 11/27/2019 09/28/2019  WBC 4.0 - 10.5 K/uL 9.5 10.6(H) 7.9  Hemoglobin 13.0 - 17.0 g/dL 11.4(L) 12.0(L) 13.5  Hematocrit 39.0 - 52.0 % 37.4(L) 37.9(L) 42.0  Platelets 150 - 400 K/uL 241 228 148.0(L)    ABG No results found for: PHART, PCO2ART, PO2ART, HCO3, TCO2, ACIDBASEDEF, O2SAT   CBG (last 3)  No results for input(s): GLUCAP in the last 72 hours.   Chesley Mires, MD [facility] Pulmonary/Critical Care 11/02/2019, 9:37 AM

## 2019-11-02 NOTE — ED Notes (Signed)
Hospitalist text paged regarding pts O2 saturation of 85% on hi flow nasal cannula.  Awaiting response.

## 2019-11-02 NOTE — ED Notes (Signed)
Pt transferred to floor by RT and charge RN.

## 2019-11-03 ENCOUNTER — Inpatient Hospital Stay (HOSPITAL_COMMUNITY): Payer: Medicare Other

## 2019-11-03 LAB — COMPREHENSIVE METABOLIC PANEL
ALT: 123 U/L — ABNORMAL HIGH (ref 0–44)
AST: 94 U/L — ABNORMAL HIGH (ref 15–41)
Albumin: 3.1 g/dL — ABNORMAL LOW (ref 3.5–5.0)
Alkaline Phosphatase: 88 U/L (ref 38–126)
Anion gap: 13 (ref 5–15)
BUN: 27 mg/dL — ABNORMAL HIGH (ref 8–23)
CO2: 23 mmol/L (ref 22–32)
Calcium: 8.3 mg/dL — ABNORMAL LOW (ref 8.9–10.3)
Chloride: 104 mmol/L (ref 98–111)
Creatinine, Ser: 1.11 mg/dL (ref 0.61–1.24)
GFR calc Af Amer: >60 mL/min (ref >60)
GFR calc non Af Amer: >60 mL/min (ref >60)
Glucose, Bld: 239 mg/dL — ABNORMAL HIGH (ref 70–99)
Potassium: 4.1 mmol/L (ref 3.5–5.1)
Sodium: 140 mmol/L (ref 135–145)
Total Bilirubin: 1.3 mg/dL — ABNORMAL HIGH (ref 0.3–1.2)
Total Protein: 7.1 g/dL (ref 6.5–8.1)

## 2019-11-03 LAB — CBC WITH DIFFERENTIAL/PLATELET
Abs Immature Granulocytes: 0.49 10*3/uL — ABNORMAL HIGH (ref 0.00–0.07)
Basophils Absolute: 0 10*3/uL (ref 0.0–0.1)
Basophils Relative: 0 %
Eosinophils Absolute: 0 10*3/uL (ref 0.0–0.5)
Eosinophils Relative: 0 %
HCT: 37.7 % — ABNORMAL LOW (ref 39.0–52.0)
Hemoglobin: 11.5 g/dL — ABNORMAL LOW (ref 13.0–17.0)
Immature Granulocytes: 4 %
Lymphocytes Relative: 5 %
Lymphs Abs: 0.7 10*3/uL (ref 0.7–4.0)
MCH: 27.4 pg (ref 26.0–34.0)
MCHC: 30.5 g/dL (ref 30.0–36.0)
MCV: 90 fL (ref 80.0–100.0)
Monocytes Absolute: 0.3 10*3/uL (ref 0.1–1.0)
Monocytes Relative: 3 %
Neutro Abs: 12 10*3/uL — ABNORMAL HIGH (ref 1.7–7.7)
Neutrophils Relative %: 88 %
Platelets: 266 10*3/uL (ref 150–400)
RBC: 4.19 MIL/uL — ABNORMAL LOW (ref 4.22–5.81)
RDW: 12.6 % (ref 11.5–15.5)
WBC: 13.6 10*3/uL — ABNORMAL HIGH (ref 4.0–10.5)
nRBC: 2.4 % — ABNORMAL HIGH (ref 0.0–0.2)

## 2019-11-03 LAB — GLUCOSE, CAPILLARY
Glucose-Capillary: 183 mg/dL — ABNORMAL HIGH (ref 70–99)
Glucose-Capillary: 192 mg/dL — ABNORMAL HIGH (ref 70–99)
Glucose-Capillary: 231 mg/dL — ABNORMAL HIGH (ref 70–99)
Glucose-Capillary: 280 mg/dL — ABNORMAL HIGH (ref 70–99)
Glucose-Capillary: 315 mg/dL — ABNORMAL HIGH (ref 70–99)
Glucose-Capillary: 325 mg/dL — ABNORMAL HIGH (ref 70–99)

## 2019-11-03 LAB — MRSA PCR SCREENING: MRSA by PCR: NEGATIVE

## 2019-11-03 LAB — D-DIMER, QUANTITATIVE: D-Dimer, Quant: 1.56 ug/mL-FEU — ABNORMAL HIGH (ref 0.00–0.50)

## 2019-11-03 LAB — FERRITIN: Ferritin: 2247 ng/mL — ABNORMAL HIGH (ref 24–336)

## 2019-11-03 LAB — C-REACTIVE PROTEIN: CRP: 11.2 mg/dL — ABNORMAL HIGH (ref <1.0)

## 2019-11-03 MED ORDER — SODIUM CHLORIDE (PF) 0.9 % IJ SOLN
INTRAMUSCULAR | Status: AC
  Administered 2019-11-03: 10 mL
  Filled 2019-11-03: qty 50

## 2019-11-03 MED ORDER — SENNOSIDES-DOCUSATE SODIUM 8.6-50 MG PO TABS: 1.0000 | ORAL_TABLET | Freq: Two times a day (BID) | ORAL | Status: DC | PRN

## 2019-11-03 MED ORDER — ORAL CARE MOUTH RINSE
15.0000 mL | Freq: Two times a day (BID) | OROMUCOSAL | Status: DC
  Administered 2019-11-03 – 2019-11-16 (×25): 15 mL via OROMUCOSAL

## 2019-11-03 MED ORDER — POLYETHYLENE GLYCOL 3350 17 G PO PACK
17.0000 g | PACK | Freq: Every day | ORAL | Status: DC | PRN
  Administered 2019-11-04: 17 g via ORAL
  Filled 2019-11-03: qty 1

## 2019-11-03 MED ORDER — ENOXAPARIN SODIUM 40 MG/0.4ML ~~LOC~~ SOLN
40.0000 mg | Freq: Two times a day (BID) | SUBCUTANEOUS | Status: DC
  Administered 2019-11-03 – 2019-11-09 (×13): 40 mg via SUBCUTANEOUS
  Filled 2019-11-03 (×12): qty 0.4

## 2019-11-03 MED ORDER — IOHEXOL 350 MG/ML SOLN
100.0000 mL | Freq: Once | INTRAVENOUS | Status: AC | PRN
  Administered 2019-11-03: 12:00:00 100 mL via INTRAVENOUS

## 2019-11-03 NOTE — Progress Notes (Signed)
PROGRESS NOTE  Levi Jones [id number] DOB: 11-22-1950   PCP: Colon Branch, MD  Patient is from: Home  DOA: 10/29/2019 LOS: 2  Brief Narrative / Interim history: 69 year old male with history of sarcoidosis, HTN, HLD and BPH brought to ED by EMS due to worsening fatigue and shortness of breath for 2 to 3 days.  Tested positive for COVID-19 on 12/25.  In ED, COVID-19 test positive.  Desaturated to 50% on arrival.  Saturation improved to 85% on 35 L HFNC.  Saturation improved further to 95% with addition of nonrebreather.  Slightly elevated LFTs.  Elevated inflammatory markers.  Lactic acid 3.2 but improved.  Procalcitonin 0.2.  CXR with bilateral ill-defined airspace opacities.  Admitted for acute respiratory failure due to COVID-19 pneumonia.  Started on Actemra, convalescent plasma, remdesivir and Decadron.  PCCM consulted.  Subjective: No major events overnight of this morning.  Remains tachycardic with respiratory rate in 20s to 40s.  Continues to require 15 L / 100% HFNC to maintain appropriate saturation.  He is on NRB with the hope to get CTA and transfer to [facility] this morning.  No complaints.  He denies chest pain, dyspnea, cough, GI or UTI symptoms.  Speaks in full sentences.  Does not appear to be in distress.  Objective: Vitals:   11/03/19 0700 11/03/19 0830 11/03/19 0858 11/03/19 0924  BP: (!) 146/82   (!) 147/84  Pulse: 86     Resp: (!) 31     Temp:  98.6 F (37 C)    TempSrc:  Axillary    SpO2: (!) 88%  95%   Weight:      Height:        Intake/Output Summary (Last 24 hours) at 11/03/2019 1016 Last data filed at 11/03/2019 0853 Gross per 24 hour  Intake 830 ml  Output 1300 ml  Net -470 ml   Filed Weights   10/31/2019 1000 11/02/19 2053  Weight: 115.3 kg 115.3 kg    Examination:  GENERAL: No acute distress.  Appears well.  HEENT: MMM.  Vision and hearing grossly intact.  NECK: Supple.  No apparent JVD.  RESP: On NRB.  Diminished aeration bilaterally.  Rhonchi  bilaterally. CVS:  RRR. Heart sounds normal.  ABD/GI/GU: Bowel sounds present. Soft. Non tender.  MSK/EXT:  Moves extremities. No apparent deformity. No edema.  SKIN: no apparent skin lesion or wound NEURO: Awake, alert and oriented appropriately.  No apparent focal neuro deficit. PSYCH: Calm. Normal affect.  Procedures:  None  Assessment & Plan: Acute respiratory failure with hypoxia due to COVID-19 pneumonia: desaturated to 50% on room air on arrival.  CXR with bilateral opacities.  Inflammatory markers elevated but improving.  Remains tachypneic with high oxygen requirement Recent Labs    11/25/2019 0844 11/02/19 0412 11/03/19 0207  DDIMER 1.62* 1.90* 1.56*  FERRITIN 4,398* 2,701* 2,247*  LDH 911*  --   --   CRP 18.5* 19.7* 11.2*  -Received Actemra and convalescent plasma on 11/01/2018 -Continue remdesivir and Solu-Medrol. -Hopefully will get CTA chest to exclude PE today.  Okay to switch Lovenox to prophylactic dose -Continue inhalers, mucolytic's, antitussive and incentive spirometry -Proning as much as possible -Appreciate PCCM guidance-recommended goal saturation of 85 to 95%. -Transfer to [facility]-progressive bed.   AKI on CKD-3a -Cr 1.2-1.4 (baseline) >1.46 (admit)> 1.1.  Raises question of CKD-3.  History of sarcoidosis: Does not appear to be on medication for this. -Steroid as above could help  Essential hypertension: Normotensive. -Continue home amlodipine -Hold home  losartan in the setting of AKI.  BPH: No LUTS: Not on medication. -Monitor urine output  HLD -Continue home statin.                 DVT prophylaxis: On therapeutic Lovenox Code Status: Full code-confirmed this with patient and patient's wife Family Communication: Updated patient's wife over the phone. Disposition Plan: Transfer to [facility]-progressive bed. Consultants: PCCM   Microbiology summarized: POC COVID-19 test positive. Blood culture negative.  Sch Meds:  Scheduled Meds: .  albuterol  2 puff Inhalation Q6H  . amLODipine  10 mg Oral Daily  . vitamin C  500 mg Oral Daily  . aspirin EC  81 mg Oral Daily  . atorvastatin  20 mg Oral QHS  . Chlorhexidine Gluconate Cloth  6 each Topical Daily  . enoxaparin (LOVENOX) injection  40 mg Subcutaneous Q12H  . insulin aspart  0-20 Units Subcutaneous TID WC  . insulin aspart  0-5 Units Subcutaneous QHS  . ipratropium  2 puff Inhalation Q6H  . loratadine  10 mg Oral Daily  . methylPREDNISolone (SOLU-MEDROL) injection  60 mg Intravenous Q12H  . zinc sulfate  220 mg Oral Daily   Continuous Infusions: . remdesivir 100 mg in NS 100 mL 100 mg (11/03/19 0953)   PRN Meds:.guaiFENesin-dextromethorphan  Antimicrobials: Anti-infectives (From admission, onward)   Start     Dose/Rate Route Frequency Ordered Stop   11/02/19 1000  remdesivir 100 mg in sodium chloride 0.9 % 100 mL IVPB  Status:  Discontinued     100 mg 200 mL/hr over 30 Minutes Intravenous Daily 11/10/2019 1838 11/04/2019 1847   11/02/19 1000  remdesivir 100 mg in sodium chloride 0.9 % 100 mL IVPB     100 mg 200 mL/hr over 30 Minutes Intravenous Daily 10/29/2019 1054 11/06/19 0959   11/09/2019 1838  remdesivir 200 mg in sodium chloride 0.9% 250 mL IVPB  Status:  Discontinued     200 mg 580 mL/hr over 30 Minutes Intravenous Once 11/08/2019 1838 11/18/2019 1847   11/16/2019 1200  remdesivir 200 mg in sodium chloride 0.9% 250 mL IVPB     200 mg 580 mL/hr over 30 Minutes Intravenous Once 11/18/2019 1054 11/18/2019 1302       I have personally reviewed the following labs and images: CBC: Recent Labs  Lab 11/14/2019 0844 11/02/19 0412 11/03/19 0207  WBC 10.6* 9.5 13.6*  NEUTROABS 8.9* 7.4 12.0*  HGB 12.0* 11.4* 11.5*  HCT 37.9* 37.4* 37.7*  MCV 88.3 90.1 90.0  PLT 228 241 266   BMP &GFR Recent Labs  Lab 11/08/2019 0844 11/02/19 0412 11/03/19 0207  NA 139 139 140  K 3.9 4.3 4.1  CL 101 105 104  CO2 25 21* 23  GLUCOSE 203* 219* 239*  BUN 17 21 27*  CREATININE 1.43*  1.10 1.11  CALCIUM 8.4* 8.2* 8.3*   Estimated Creatinine Clearance: 84.7 mL/min (by C-G formula based on SCr of 1.11 mg/dL). Liver & Pancreas: Recent Labs  Lab 11/10/2019 0844 11/02/19 0412 11/03/19 0207  AST 119* 133* 94*  ALT 114* 117* 123*  ALKPHOS 64 72 88  BILITOT 1.7* 1.0 1.3*  PROT 7.5 7.3 7.1  ALBUMIN 3.5 3.2* 3.1*   No results for input(s): LIPASE, AMYLASE in the last 168 hours. No results for input(s): AMMONIA in the last 168 hours. Diabetic: No results for input(s): HGBA1C in the last 72 hours. Recent Labs  Lab 11/02/19 1155 11/03/19 0849  GLUCAP 217* 231*   Cardiac Enzymes: No results for  input(s): CKTOTAL, CKMB, CKMBINDEX, TROPONINI in the last 168 hours. No results for input(s): PROBNP in the last 8760 hours. Coagulation Profile: No results for input(s): INR, PROTIME in the last 168 hours. Thyroid Function Tests: No results for input(s): TSH, T4TOTAL, FREET4, T3FREE, THYROIDAB in the last 72 hours. Lipid Profile: Recent Labs    11/16/2019 0844  TRIG 112   Anemia Panel: Recent Labs    11/02/19 0412 11/03/19 0207  FERRITIN 2,701* 2,247*   Urine analysis:    Component Value Date/Time   COLORURINE YELLOW 12/14/2018 [place] 12/14/2018 0819   LABSPEC 1.025 12/14/2018 0819   PHURINE 5.5 12/14/2018 0819   GLUCOSEU NEGATIVE 12/14/2018 0819   HGBUR NEGATIVE 12/14/2018 0819   HGBUR negative 03/07/2010 1018   BILIRUBINUR SMALL (A) 12/14/2018 0819   BILIRUBINUR Small 06/24/2016 1144   KETONESUR TRACE (A) 12/14/2018 0819   PROTEINUR NEGATIVE 07/01/2016 1620   UROBILINOGEN 0.2 12/14/2018 0819   NITRITE NEGATIVE 12/14/2018 0819   LEUKOCYTESUR NEGATIVE 12/14/2018 0819   Sepsis Labs: Invalid input(s): PROCALCITONIN, [place]  Microbiology: Recent Results (from the past 240 hour(s))  Blood Culture (routine x 2)     Status: None (Preliminary result)   Collection Time: 11/13/2019  8:44 AM   Specimen: BLOOD  Result Value Ref Range Status    Specimen Description   Final    BLOOD RIGHT ANTECUBITAL Performed at [facility] [address] [place], [place] [address]    Special Requests   Final    BOTTLES DRAWN AEROBIC AND ANAEROBIC Blood Culture adequate volume Performed at [facility] [address], [place], [place] [address]    Culture   Final    NO GROWTH 1 DAY Performed at [facility], [place] [address] [place], [place] [address]    Report Status PENDING  Incomplete    Radiology Studies: No results found.   Meriam Chojnowski T. [place]  If 7PM-7AM, please contact night-coverage [email or web address] Password [email or web address] 11/03/2019, 10:16 AM

## 2019-11-04 LAB — BRAIN NATRIURETIC PEPTIDE: B Natriuretic Peptide: 96.6 pg/mL (ref 0.0–100.0)

## 2019-11-04 LAB — CBC WITH DIFFERENTIAL/PLATELET
Abs Immature Granulocytes: 1.11 10*3/uL — ABNORMAL HIGH (ref 0.00–0.07)
Basophils Absolute: 0.1 10*3/uL (ref 0.0–0.1)
Basophils Relative: 0 %
Eosinophils Absolute: 0 10*3/uL (ref 0.0–0.5)
Eosinophils Relative: 0 %
HCT: 39.4 % (ref 39.0–52.0)
Hemoglobin: 12.2 g/dL — ABNORMAL LOW (ref 13.0–17.0)
Immature Granulocytes: 6 %
Lymphocytes Relative: 5 %
Lymphs Abs: 1 10*3/uL (ref 0.7–4.0)
MCH: 27.8 pg (ref 26.0–34.0)
MCHC: 31 g/dL (ref 30.0–36.0)
MCV: 89.7 fL (ref 80.0–100.0)
Monocytes Absolute: 0.8 10*3/uL (ref 0.1–1.0)
Monocytes Relative: 4 %
Neutro Abs: 16.5 10*3/uL — ABNORMAL HIGH (ref 1.7–7.7)
Neutrophils Relative %: 85 %
Platelets: 367 10*3/uL (ref 150–400)
RBC: 4.39 MIL/uL (ref 4.22–5.81)
RDW: 12.8 % (ref 11.5–15.5)
WBC: 19.4 10*3/uL — ABNORMAL HIGH (ref 4.0–10.5)
nRBC: 3.1 % — ABNORMAL HIGH (ref 0.0–0.2)

## 2019-11-04 LAB — D-DIMER, QUANTITATIVE: D-Dimer, Quant: 1.32 ug/mL-FEU — ABNORMAL HIGH (ref 0.00–0.50)

## 2019-11-04 LAB — COMPREHENSIVE METABOLIC PANEL
ALT: 114 U/L — ABNORMAL HIGH (ref 0–44)
AST: 59 U/L — ABNORMAL HIGH (ref 15–41)
Albumin: 3.1 g/dL — ABNORMAL LOW (ref 3.5–5.0)
Alkaline Phosphatase: 111 U/L (ref 38–126)
Anion gap: 11 (ref 5–15)
BUN: 30 mg/dL — ABNORMAL HIGH (ref 8–23)
CO2: 24 mmol/L (ref 22–32)
Calcium: 8.4 mg/dL — ABNORMAL LOW (ref 8.9–10.3)
Chloride: 107 mmol/L (ref 98–111)
Creatinine, Ser: 1.25 mg/dL — ABNORMAL HIGH (ref 0.61–1.24)
GFR calc Af Amer: >60 mL/min (ref >60)
GFR calc non Af Amer: 59 mL/min — ABNORMAL LOW (ref >60)
Glucose, Bld: 256 mg/dL — ABNORMAL HIGH (ref 70–99)
Potassium: 3.9 mmol/L (ref 3.5–5.1)
Sodium: 142 mmol/L (ref 135–145)
Total Bilirubin: 0.9 mg/dL (ref 0.3–1.2)
Total Protein: 7.1 g/dL (ref 6.5–8.1)

## 2019-11-04 LAB — GLUCOSE, CAPILLARY
Glucose-Capillary: 240 mg/dL — ABNORMAL HIGH (ref 70–99)
Glucose-Capillary: 253 mg/dL — ABNORMAL HIGH (ref 70–99)
Glucose-Capillary: 200 mg/dL — ABNORMAL HIGH (ref 70–99)

## 2019-11-04 LAB — FERRITIN: Ferritin: 1705 ng/mL — ABNORMAL HIGH (ref 24–336)

## 2019-11-04 LAB — PROCALCITONIN: Procalcitonin: <0.1 ng/mL

## 2019-11-04 LAB — C-REACTIVE PROTEIN: CRP: 7 mg/dL — ABNORMAL HIGH (ref <1.0)

## 2019-11-04 MED ORDER — LORAZEPAM 0.5 MG PO TABS
0.5000 mg | ORAL_TABLET | Freq: Four times a day (QID) | ORAL | Status: DC | PRN
  Administered 2019-11-04 – 2019-11-07 (×6): 0.5 mg via ORAL
  Filled 2019-11-04 (×6): qty 1

## 2019-11-04 MED ORDER — ACETAMINOPHEN 325 MG PO TABS
650.0000 mg | ORAL_TABLET | Freq: Four times a day (QID) | ORAL | Status: DC | PRN
  Administered 2019-11-05 – 2019-11-11 (×2): 650 mg via ORAL
  Filled 2019-11-04 (×2): qty 2

## 2019-11-04 MED ORDER — INSULIN ASPART 100 UNIT/ML ~~LOC~~ SOLN
6.0000 [IU] | Freq: Three times a day (TID) | SUBCUTANEOUS | Status: DC
  Administered 2019-11-04 – 2019-11-08 (×14): 6 [IU] via SUBCUTANEOUS

## 2019-11-04 MED ORDER — INSULIN ASPART 100 UNIT/ML ~~LOC~~ SOLN: 0.0000 [IU] | Freq: Every day | SUBCUTANEOUS | Status: DC

## 2019-11-04 MED ORDER — INSULIN ASPART 100 UNIT/ML ~~LOC~~ SOLN
0.0000 [IU] | Freq: Three times a day (TID) | SUBCUTANEOUS | Status: DC
  Administered 2019-11-04: 09:00:00 7 [IU] via SUBCUTANEOUS
  Administered 2019-11-04: 4 [IU] via SUBCUTANEOUS
  Administered 2019-11-04: 11 [IU] via SUBCUTANEOUS
  Administered 2019-11-05: 4 [IU] via SUBCUTANEOUS
  Administered 2019-11-05 (×2): 11 [IU] via SUBCUTANEOUS
  Administered 2019-11-06: 7 [IU] via SUBCUTANEOUS
  Administered 2019-11-06: 11 [IU] via SUBCUTANEOUS
  Administered 2019-11-06: 18:00:00 15 [IU] via SUBCUTANEOUS
  Administered 2019-11-07 (×3): 3 [IU] via SUBCUTANEOUS
  Administered 2019-11-08 (×2): 7 [IU] via SUBCUTANEOUS

## 2019-11-04 MED ORDER — TAMSULOSIN HCL 0.4 MG PO CAPS
0.4000 mg | ORAL_CAPSULE | Freq: Every day | ORAL | Status: DC
  Administered 2019-11-04 – 2019-11-16 (×13): 0.4 mg via ORAL
  Filled 2019-11-04 (×13): qty 1

## 2019-11-04 MED ORDER — LINAGLIPTIN 5 MG PO TABS
5.0000 mg | ORAL_TABLET | Freq: Every day | ORAL | Status: DC
  Administered 2019-11-04 – 2019-11-09 (×6): 5 mg via ORAL
  Filled 2019-11-04 (×6): qty 1

## 2019-11-04 MED ORDER — INSULIN DETEMIR 100 UNIT/ML ~~LOC~~ SOLN
8.0000 [IU] | Freq: Every day | SUBCUTANEOUS | Status: DC
  Administered 2019-11-04 – 2019-11-08 (×5): 8 [IU] via SUBCUTANEOUS
  Filled 2019-11-04 (×8): qty 0.08

## 2019-11-04 NOTE — Progress Notes (Signed)
Pt was transported on NRB at [place]. When he arrive he was sating 97-100% on that , so he stayed on the NRB at 15L instead of going back to Heated high flow. Vital sign stable and he is A&OX4.

## 2019-11-04 NOTE — Progress Notes (Signed)
[facility] here to transport patient to [facility]. Patient with no complaints at the current time. Report called by previous RN.

## 2019-11-04 NOTE — Progress Notes (Signed)
PROGRESS NOTE  GIA HEMP [id number] DOB: 1951-08-31   PCP: Colon Branch, MD  Patient is from: Home  DOA: 11/07/2019 LOS: 3  Brief Narrative / Interim history: 69 year old male with history of sarcoidosis, HTN, HLD and BPH brought to ED by EMS due to worsening fatigue and shortness of breath for 2 to 3 days.  Tested positive for COVID-19 on 12/25.  In ED, COVID-19 test positive.  Desaturated to 50% on arrival.  Saturation improved to 85% on 35 L HFNC.  Saturation improved further to 95% with addition of nonrebreather.  Slightly elevated LFTs.  Elevated inflammatory markers.  Lactic acid 3.2 but improved.  Procalcitonin 0.2.  CXR with bilateral ill-defined airspace opacities.  Admitted for acute respiratory failure due to COVID-19 pneumonia.  Started on Actemra, convalescent plasma, remdesivir and Decadron.  PCCM consulted.  Subjective: No major events overnight of this morning. "fFustered" about himself.  Continues to require high oxygen level, 35 L / 100% to maintain appropriate saturation. Reports shortness of breath intermittently.  Denies pain.   Has not had a bowel movement in a while.  Reports some difficulty urinating at times.  Objective: Vitals:   11/04/19 0000 11/04/19 0345 11/04/19 0400 11/04/19 0800  BP: 128/62 128/62 140/78 128/73  Pulse: 88  89 90  Resp: (!) 34  (!) 28 (!) 31  Temp: 98.1 F (36.7 C)  98.4 F (36.9 C) 98.4 F (36.9 C)  TempSrc:   Axillary Oral  SpO2: 97%  90% (!) 87%  Weight:      Height:        Intake/Output Summary (Last 24 hours) at 11/04/2019 1022 Last data filed at 11/04/2019 0912 Gross per 24 hour  Intake 1309.49 ml  Output 1725 ml  Net -415.51 ml   Filed Weights   11/23/2019 1000 11/02/19 2053  Weight: 115.3 kg 115.3 kg    Examination:  GENERAL: No apparent distress.  Sitting up in bed. HEENT: MMM.  Vision and hearing grossly intact.  NECK: Supple.  No apparent JVD.  RESP: 89 to 90% on 35 L / 100%.  No IWOB.  Diminished aeration  with rhonchi and rales bilaterally. CVS:  RRR. Heart sounds normal.  ABD/GI/GU: Bowel sounds present. Soft. Non tender.  MSK/EXT:  Moves extremities. No apparent deformity. No edema.  SKIN: no apparent skin lesion or wound NEURO: Awake, alert and oriented appropriately.  No apparent focal neuro deficit. PSYCH: Appears anxious.  "Flustered"  Procedures:  None  Assessment & Plan: Acute respiratory failure with hypoxia due to COVID-19 pneumonia: Late presentation after significant parenchymal injury.  Tested positive on 12/25.  Desaturated to 50% on room air on arrival.  CXR with bilateral opacities.  CTA chest confirms bilateral pneumonia but negative for PE.  Inflammatory markers elevated but improving.  Remains tachypneic with high oxygen requirement, 35 L / 100%.  Patient is  full code. Recent Labs    11/02/19 0412 11/03/19 0207 11/04/19 0106  DDIMER 1.90* 1.56* 1.32*  FERRITIN 2,701* 2,247* 1,705*  CRP 19.7* 11.2* 7.0*  -Received Actemra and convalescent plasma on 10/31/2018 -Remdesivir and Solu-Medrol 1/5>> -Subcu Lovenox for VTE's prophylaxis -Continue inhalers, mucolytic's, antitussive and incentive spirometry -Proning as much as possible -Appreciate PCCM guidance-recommended goal saturation of 85 to 95%. -Transfer to GVC-progressive bed if stable on NRB for transport per Dr. [place] Bells  Steroid-induced hyperglycemia: no history of diabetes.  A1c 5.3%. Recent Labs    11/03/19 1255 11/03/19 1832 11/03/19 2111  GLUCAP 280* 315* 325*  -Add  Levemir 8 units twice daily and Tradjenta 5 mg daily -Add NovoLog 6 units AC -Continue SSI-high and CBG monitoring  AKI on CKD-3a -Cr 1.2-1.4 (baseline) >1.46 (admit)> 1.1.  Raises question of CKD-3.  History of sarcoidosis: Does not appear to be on medication for this. -Steroid as above could help  Essential hypertension: Normotensive. -Continue home amlodipine -Hold home losartan in the setting of AKI.  BPH: No LUTS: Not on  medication. -Start Flomax -Monitor urine output  HLD -Continue home statin.  Constipation -Senokot-S and MiraLAX as needed  Anxiety: -Low-dose Ativan as needed cautiously                DVT prophylaxis: On therapeutic Lovenox Code Status: Full code-confirmed this with patient and patient's wife Family Communication: Updated patient's wife over the phone. Disposition Plan: Transfer to GVC-progressive bed. Consultants: PCCM   Microbiology summarized: POC COVID-19 test positive. Blood culture negative.  Sch Meds:  Scheduled Meds: . albuterol  2 puff Inhalation Q6H  . amLODipine  10 mg Oral Daily  . vitamin C  500 mg Oral Daily  . aspirin EC  81 mg Oral Daily  . atorvastatin  20 mg Oral QHS  . Chlorhexidine Gluconate Cloth  6 each Topical Daily  . enoxaparin (LOVENOX) injection  40 mg Subcutaneous Q12H  . insulin aspart  0-20 Units Subcutaneous TID WC  . insulin aspart  0-5 Units Subcutaneous QHS  . insulin aspart  6 Units Subcutaneous TID WC  . insulin detemir  8 Units Subcutaneous QHS  . ipratropium  2 puff Inhalation Q6H  . loratadine  10 mg Oral Daily  . mouth rinse  15 mL Mouth Rinse BID  . methylPREDNISolone (SOLU-MEDROL) injection  60 mg Intravenous Q12H  . tamsulosin  0.4 mg Oral Daily  . zinc sulfate  220 mg Oral Daily   Continuous Infusions: . remdesivir 100 mg in NS 100 mL 100 mg (11/04/19 0912)   PRN Meds:.acetaminophen, guaiFENesin-dextromethorphan, LORazepam, polyethylene glycol, senna-docusate  Antimicrobials: Anti-infectives (From admission, onward)   Start     Dose/Rate Route Frequency Ordered Stop   11/02/19 1000  remdesivir 100 mg in sodium chloride 0.9 % 100 mL IVPB  Status:  Discontinued     100 mg 200 mL/hr over 30 Minutes Intravenous Daily 11/24/2019 1838 11/18/2019 1847   11/02/19 1000  remdesivir 100 mg in sodium chloride 0.9 % 100 mL IVPB     100 mg 200 mL/hr over 30 Minutes Intravenous Daily 11/18/2019 1054 11/06/19 0959   11/22/2019  1838  remdesivir 200 mg in sodium chloride 0.9% 250 mL IVPB  Status:  Discontinued     200 mg 580 mL/hr over 30 Minutes Intravenous Once 10/30/2019 1838 11/16/2019 1847   10/30/2019 1200  remdesivir 200 mg in sodium chloride 0.9% 250 mL IVPB     200 mg 580 mL/hr over 30 Minutes Intravenous Once 10/30/2019 1054 11/22/2019 1302       I have personally reviewed the following labs and images: CBC: Recent Labs  Lab 10/29/2019 0844 11/02/19 0412 11/03/19 0207 11/04/19 0106  WBC 10.6* 9.5 13.6* 19.4*  NEUTROABS 8.9* 7.4 12.0* 16.5*  HGB 12.0* 11.4* 11.5* 12.2*  HCT 37.9* 37.4* 37.7* 39.4  MCV 88.3 90.1 90.0 89.7  PLT 228 241 266 367   BMP &GFR Recent Labs  Lab 11/12/2019 0844 11/02/19 0412 11/03/19 0207 11/04/19 0106  NA 139 139 140 142  K 3.9 4.3 4.1 3.9  CL 101 105 104 107  CO2 25 21* 23  24  GLUCOSE 203* 219* 239* 256*  BUN 17 21 27* 30*  CREATININE 1.43* 1.10 1.11 1.25*  CALCIUM 8.4* 8.2* 8.3* 8.4*   Estimated Creatinine Clearance: 75.2 mL/min (A) (by C-G formula based on SCr of 1.25 mg/dL (H)). Liver & Pancreas: Recent Labs  Lab 10/31/2019 0844 11/02/19 0412 11/03/19 0207 11/04/19 0106  AST 119* 133* 94* 59*  ALT 114* 117* 123* 114*  ALKPHOS 64 72 88 111  BILITOT 1.7* 1.0 1.3* 0.9  PROT 7.5 7.3 7.1 7.1  ALBUMIN 3.5 3.2* 3.1* 3.1*   No results for input(s): LIPASE, AMYLASE in the last 168 hours. No results for input(s): AMMONIA in the last 168 hours. Diabetic: No results for input(s): HGBA1C in the last 72 hours. Recent Labs  Lab 11/02/19 2151 11/03/19 0849 11/03/19 1255 11/03/19 1832 11/03/19 2111  GLUCAP 192* 231* 280* 315* 325*   Cardiac Enzymes: No results for input(s): CKTOTAL, CKMB, CKMBINDEX, TROPONINI in the last 168 hours. No results for input(s): PROBNP in the last 8760 hours. Coagulation Profile: No results for input(s): INR, PROTIME in the last 168 hours. Thyroid Function Tests: No results for input(s): TSH, T4TOTAL, FREET4, T3FREE, THYROIDAB in the  last 72 hours. Lipid Profile: No results for input(s): CHOL, HDL, LDLCALC, TRIG, CHOLHDL, LDLDIRECT in the last 72 hours. Anemia Panel: Recent Labs    11/03/19 0207 11/04/19 0106  FERRITIN 2,247* 1,705*   Urine analysis:    Component Value Date/Time   COLORURINE YELLOW 12/14/2018 [place] 12/14/2018 0819   LABSPEC 1.025 12/14/2018 0819   PHURINE 5.5 12/14/2018 0819   GLUCOSEU NEGATIVE 12/14/2018 0819   HGBUR NEGATIVE 12/14/2018 0819   HGBUR negative 03/07/2010 1018   BILIRUBINUR SMALL (A) 12/14/2018 0819   BILIRUBINUR Small 06/24/2016 1144   KETONESUR TRACE (A) 12/14/2018 0819   PROTEINUR NEGATIVE 07/01/2016 1620   UROBILINOGEN 0.2 12/14/2018 0819   NITRITE NEGATIVE 12/14/2018 0819   LEUKOCYTESUR NEGATIVE 12/14/2018 0819   Sepsis Labs: Invalid input(s): PROCALCITONIN, [place]  Microbiology: Recent Results (from the past 240 hour(s))  Blood Culture (routine x 2)     Status: None (Preliminary result)   Collection Time: 11/20/2019  8:44 AM   Specimen: BLOOD  Result Value Ref Range Status   Specimen Description   Final    BLOOD RIGHT ANTECUBITAL Performed at [facility] [address] [place], [place] [address]    Special Requests   Final    BOTTLES DRAWN AEROBIC AND ANAEROBIC Blood Culture adequate volume Performed at [facility] [address] [place], [place] [address]    Culture   Final    NO GROWTH 3 DAYS Performed at [facility], [place] [address] [place], [place] [address]    Report Status PENDING  Incomplete  MRSA PCR Screening     Status: None   Collection Time: 11/03/19  3:31 AM   Specimen: Nasal Mucosa; Nasopharyngeal  Result Value Ref Range Status   MRSA by PCR NEGATIVE NEGATIVE Final    Comment:        The GeneXpert MRSA Assay (FDA approved for NASAL specimens only), is one component of a comprehensive MRSA colonization surveillance program. It is not intended to diagnose  MRSA infection nor to guide or monitor treatment for MRSA infections. Performed at [facility], [place] [address], [place], [place] [address]     Radiology Studies: CT ANGIO CHEST PE W OR WO CONTRAST  Result Date: 11/03/2019 CLINICAL DATA:  Shortness  of breath, elevated D-dimer, hypoxia EXAM: CT ANGIOGRAPHY CHEST WITH CONTRAST TECHNIQUE: Multidetector CT imaging of the chest was performed using the standard protocol during bolus administration of intravenous contrast. Multiplanar CT image reconstructions and MIPs were obtained to evaluate the vascular anatomy. CONTRAST:  136mL OMNIPAQUE IOHEXOL 350 MG/ML SOLN COMPARISON:  07/27/2017 FINDINGS: Cardiovascular: Examination for pulmonary embolism is somewhat limited by breath motion artifact, particularly in the lower lobes. Within this limitation, no evidence of pulmonary embolism through the segmental pulmonary arterial level. Normal heart size. No pericardial effusion. Mediastinum/Nodes: Numerous calcified bilateral hilar and mediastinal lymph nodes. Thyroid gland, trachea, and esophagus demonstrate no significant findings. Lungs/Pleura: Extensive bilateral heterogeneous and ground-glass airspace opacity. Fibrotic change of the perihilar upper lobes (series 6, image 65). Upper Abdomen: No acute abnormality. Somewhat coarse appearing contour of the liver in the included upper abdomen. Musculoskeletal: No chest wall abnormality. No acute or significant osseous findings. Review of the MIP images confirms the above findings. IMPRESSION: 1. Examination for pulmonary embolism is somewhat limited by breath motion artifact, particularly in the lower lobes. Within this limitation, no evidence of pulmonary embolism through the segmental pulmonary arterial level. 2. Extensive bilateral heterogeneous and ground-glass airspace opacity. Findings are consistent with multifocal infection, edema, and/or ARDS. 3. Fibrotic change of the perihilar upper lobes,  combined with numerous calcified bilateral hilar and mediastinal lymph nodes, constellation of findings generally consistent with fibrotic pulmonary sarcoidosis and similar to appearance on prior chest radiographs dated 07/27/2017. 4. Somewhat coarse appearing contour of the liver in the included upper abdomen, suggestive of cirrhosis. Electronically Signed   By: Eddie Candle M.D.   On: 11/03/2019 12:33     Chaniece Barbato T. [place]  If 7PM-7AM, please contact night-coverage [email or web address] Password [facility] 11/04/2019, 10:22 AM

## 2019-11-04 NOTE — Progress Notes (Signed)
Levi Jones, is a 69 y.o. male, DOB - [birth year], SU:[id number]   From [facility], ICU, was on heated high flow, arrived here comfortable, in no distress and resting well, saturating in high 90s will try and titrate his oxygen down, he has severe acute hypoxic respiratory failure due to COVID-19 pneumonia and has been treated appropriately with IV steroids, remdesivir and Actemra.  We will also check a BNP along with procalcitonin and monitor closely.  Noted to have mild leukocytosis likely due to steroids however we will keep an eye out for any secondary bacterial infection.   Vitals:   11/04/19 0800 11/04/19 1200 11/04/19 1300 11/04/19 1645  BP: 128/73 134/66  128/80  Pulse: 90 90  91  Resp: (!) 31 (!) 30  (!) 25  Temp: 98.4 F (36.9 C)   98.3 F (36.8 C)  TempSrc: Oral   Oral  SpO2: (!) 87% 97% 96% 97%  Weight:      Height:            Data Review   Micro Results Recent Results (from the past 240 hour(s))  Blood Culture (routine x 2)     Status: None (Preliminary result)   Collection Time: 11/03/2019  8:44 AM   Specimen: BLOOD  Result Value Ref Range Status   Specimen Description   Final    BLOOD RIGHT ANTECUBITAL Performed at [facility], [place] [address] [place], [place] [address]    Special Requests   Final    BOTTLES DRAWN AEROBIC AND ANAEROBIC Blood Culture adequate volume Performed at [facility] [address], [place], [place] [address]    Culture   Final    NO GROWTH 3 DAYS Performed at [facility], [place] [address], [place], [place] [address]    Report Status PENDING  Incomplete  MRSA PCR Screening     Status: None   Collection Time: 11/03/19  3:31 AM   Specimen: Nasal Mucosa; Nasopharyngeal  Result Value Ref Range Status   MRSA by PCR NEGATIVE NEGATIVE Final    Comment:        The GeneXpert MRSA Assay (FDA approved for NASAL  specimens only), is one component of a comprehensive MRSA colonization surveillance program. It is not intended to diagnose MRSA infection nor to guide or monitor treatment for MRSA infections. Performed at [facility], [place] [address], [place], [place] [address]     Radiology Reports CT ANGIO CHEST PE W OR WO CONTRAST  Result Date: 11/03/2019 CLINICAL DATA:  Shortness of breath, elevated D-dimer, hypoxia EXAM: CT ANGIOGRAPHY CHEST WITH CONTRAST TECHNIQUE: Multidetector CT imaging of the chest was performed using the standard protocol during bolus administration of intravenous contrast. Multiplanar CT image reconstructions and MIPs were obtained to evaluate the vascular anatomy. CONTRAST:  160mL OMNIPAQUE IOHEXOL 350 MG/ML SOLN COMPARISON:  07/27/2017 FINDINGS: Cardiovascular: Examination for pulmonary embolism is somewhat limited by breath motion artifact, particularly in the lower lobes. Within this limitation, no evidence of pulmonary embolism through the segmental pulmonary arterial level. Normal heart size. No pericardial effusion. Mediastinum/Nodes: Numerous calcified bilateral hilar and mediastinal lymph nodes. Thyroid gland, trachea, and esophagus demonstrate no significant findings. Lungs/Pleura: Extensive bilateral heterogeneous and ground-glass airspace opacity. Fibrotic change of the perihilar upper lobes (series 6, image 65). Upper Abdomen: No acute abnormality. Somewhat coarse appearing contour of the liver in the included upper abdomen. Musculoskeletal: No chest wall abnormality. No acute or significant osseous findings. Review of the  MIP images confirms the above findings. IMPRESSION: 1. Examination for pulmonary embolism is somewhat limited by breath motion artifact, particularly in the lower lobes. Within this limitation, no evidence of pulmonary embolism through the segmental pulmonary arterial level. 2. Extensive bilateral heterogeneous and ground-glass airspace  opacity. Findings are consistent with multifocal infection, edema, and/or ARDS. 3. Fibrotic change of the perihilar upper lobes, combined with numerous calcified bilateral hilar and mediastinal lymph nodes, constellation of findings generally consistent with fibrotic pulmonary sarcoidosis and similar to appearance on prior chest radiographs dated 07/27/2017. 4. Somewhat coarse appearing contour of the liver in the included upper abdomen, suggestive of cirrhosis. Electronically Signed   By: Eddie Candle M.D.   On: 11/03/2019 12:33   DG Chest Port 1 View  Result Date: 10/29/2019 CLINICAL DATA:  Shortness of breath, worsening COVID symptoms, decreased O2 sats. EXAM: PORTABLE CHEST 1 VIEW COMPARISON:  Chest radiograph 04/30/2017 FINDINGS: Heart size at the upper limits of normal. Ill-defined bilateral airspace opacities with a mid to lower lung predominance. No sizable pleural effusion or evidence of pneumothorax. No acute bony abnormality. IMPRESSION: Bilateral ill-defined airspace opacities with a mid to lower lung predominance. Findings likely reflect atypical/viral pneumonia given provided history of COVID. Radiographic follow-up to resolution recommended. Electronically Signed   By: Kellie Simmering DO   On: 11/18/2019 08:33    CBC Recent Labs  Lab 11/03/2019 0844 11/02/19 0412 11/03/19 0207 11/04/19 0106  WBC 10.6* 9.5 13.6* 19.4*  HGB 12.0* 11.4* 11.5* 12.2*  HCT 37.9* 37.4* 37.7* 39.4  PLT 228 241 266 367  MCV 88.3 90.1 90.0 89.7  MCH 28.0 27.5 27.4 27.8  MCHC 31.7 30.5 30.5 31.0  RDW 12.3 12.5 12.6 12.8  LYMPHSABS 0.7 1.0 0.7 1.0  MONOABS 0.6 0.6 0.3 0.8  EOSABS 0.0 0.0 0.0 0.0  BASOSABS 0.0 0.1 0.0 0.1    Chemistries  Recent Labs  Lab 11/14/2019 0844 11/02/19 0412 11/03/19 0207 11/04/19 0106  NA 139 139 140 142  K 3.9 4.3 4.1 3.9  CL 101 105 104 107  CO2 25 21* 23 24  GLUCOSE 203* 219* 239* 256*  BUN 17 21 27* 30*  CREATININE 1.43* 1.10 1.11 1.25*  CALCIUM 8.4* 8.2* 8.3* 8.4*    AST 119* 133* 94* 59*  ALT 114* 117* 123* 114*  ALKPHOS 64 72 88 111  BILITOT 1.7* 1.0 1.3* 0.9   ------------------------------------------------------------------------------------------------------------------ estimated creatinine clearance is 75.2 mL/min (A) (by C-G formula based on SCr of 1.25 mg/dL (H)). ------------------------------------------------------------------------------------------------------------------ No results for input(s): HGBA1C in the last 72 hours. ------------------------------------------------------------------------------------------------------------------ No results for input(s): CHOL, HDL, LDLCALC, TRIG, CHOLHDL, LDLDIRECT in the last 72 hours. ------------------------------------------------------------------------------------------------------------------ No results for input(s): TSH, T4TOTAL, T3FREE, THYROIDAB in the last 72 hours.  Invalid input(s): FREET3 ------------------------------------------------------------------------------------------------------------------ Recent Labs    11/03/19 0207 11/04/19 0106  FERRITIN 2,247* 1,705*    Coagulation profile No results for input(s): INR, PROTIME in the last 168 hours.  Recent Labs    11/03/19 0207 11/04/19 0106  DDIMER 1.56* 1.32*    Cardiac Enzymes No results for input(s): CKMB, TROPONINI, MYOGLOBIN in the last 168 hours.  Invalid input(s): CK ------------------------------------------------------------------------------------------------------------------ Invalid input(s): POCBNP   Signature  Lala Lund M.D on 11/04/2019 at 5:03 PM   -  To page go to [email or web address]

## 2019-11-05 ENCOUNTER — Inpatient Hospital Stay (HOSPITAL_COMMUNITY): Payer: Medicare Other

## 2019-11-05 LAB — CBC WITH DIFFERENTIAL/PLATELET
Abs Immature Granulocytes: 0.9 10*3/uL — ABNORMAL HIGH (ref 0.00–0.07)
Basophils Absolute: 0.1 10*3/uL (ref 0.0–0.1)
Basophils Relative: 0 %
Eosinophils Absolute: 0 10*3/uL (ref 0.0–0.5)
Eosinophils Relative: 0 %
HCT: 42 % (ref 39.0–52.0)
Hemoglobin: 12.8 g/dL — ABNORMAL LOW (ref 13.0–17.0)
Immature Granulocytes: 4 %
Lymphocytes Relative: 4 %
Lymphs Abs: 0.8 10*3/uL (ref 0.7–4.0)
MCH: 27.9 pg (ref 26.0–34.0)
MCHC: 30.5 g/dL (ref 30.0–36.0)
MCV: 91.5 fL (ref 80.0–100.0)
Monocytes Absolute: 0.8 10*3/uL (ref 0.1–1.0)
Monocytes Relative: 4 %
Neutro Abs: 18.4 10*3/uL — ABNORMAL HIGH (ref 1.7–7.7)
Neutrophils Relative %: 88 %
Platelets: 363 10*3/uL (ref 150–400)
RBC: 4.59 MIL/uL (ref 4.22–5.81)
RDW: 13.1 % (ref 11.5–15.5)
WBC: 20.9 10*3/uL — ABNORMAL HIGH (ref 4.0–10.5)
nRBC: 1.3 % — ABNORMAL HIGH (ref 0.0–0.2)

## 2019-11-05 LAB — GLUCOSE, CAPILLARY
Glucose-Capillary: 258 mg/dL — ABNORMAL HIGH (ref 70–99)
Glucose-Capillary: 294 mg/dL — ABNORMAL HIGH (ref 70–99)
Glucose-Capillary: 186 mg/dL — ABNORMAL HIGH (ref 70–99)
Glucose-Capillary: 175 mg/dL — ABNORMAL HIGH (ref 70–99)

## 2019-11-05 LAB — COMPREHENSIVE METABOLIC PANEL
ALT: 92 U/L — ABNORMAL HIGH (ref 0–44)
AST: 46 U/L — ABNORMAL HIGH (ref 15–41)
Albumin: 3.1 g/dL — ABNORMAL LOW (ref 3.5–5.0)
Alkaline Phosphatase: 138 U/L — ABNORMAL HIGH (ref 38–126)
Anion gap: 12 (ref 5–15)
BUN: 37 mg/dL — ABNORMAL HIGH (ref 8–23)
CO2: 25 mmol/L (ref 22–32)
Calcium: 8.3 mg/dL — ABNORMAL LOW (ref 8.9–10.3)
Chloride: 106 mmol/L (ref 98–111)
Creatinine, Ser: 1.24 mg/dL (ref 0.61–1.24)
GFR calc Af Amer: >60 mL/min (ref >60)
GFR calc non Af Amer: 60 mL/min — ABNORMAL LOW (ref >60)
Glucose, Bld: 205 mg/dL — ABNORMAL HIGH (ref 70–99)
Potassium: 4.5 mmol/L (ref 3.5–5.1)
Sodium: 143 mmol/L (ref 135–145)
Total Bilirubin: 1.4 mg/dL — ABNORMAL HIGH (ref 0.3–1.2)
Total Protein: 7 g/dL (ref 6.5–8.1)

## 2019-11-05 LAB — C-REACTIVE PROTEIN: CRP: 3.7 mg/dL — ABNORMAL HIGH (ref <1.0)

## 2019-11-05 LAB — MAGNESIUM: Magnesium: 2.8 mg/dL — ABNORMAL HIGH (ref 1.7–2.4)

## 2019-11-05 LAB — PROCALCITONIN: Procalcitonin: <0.1 ng/mL

## 2019-11-05 LAB — BRAIN NATRIURETIC PEPTIDE: B Natriuretic Peptide: 64.2 pg/mL (ref 0.0–100.0)

## 2019-11-05 LAB — D-DIMER, QUANTITATIVE: D-Dimer, Quant: 2.27 ug/mL-FEU — ABNORMAL HIGH (ref 0.00–0.50)

## 2019-11-05 MED ORDER — SODIUM CHLORIDE 0.9 % IV SOLN
3.0000 g | Freq: Four times a day (QID) | INTRAVENOUS | Status: DC
  Administered 2019-11-05 – 2019-11-07 (×8): 3 g via INTRAVENOUS
  Filled 2019-11-05 (×8): qty 8

## 2019-11-05 MED ORDER — FUROSEMIDE 10 MG/ML IJ SOLN
60.0000 mg | Freq: Once | INTRAMUSCULAR | Status: AC
  Administered 2019-11-05: 10:00:00 60 mg via INTRAVENOUS
  Filled 2019-11-05: qty 6

## 2019-11-05 MED ORDER — FUROSEMIDE 10 MG/ML IJ SOLN
20.0000 mg | Freq: Once | INTRAMUSCULAR | Status: AC
  Administered 2019-11-05: 20 mg via INTRAVENOUS
  Filled 2019-11-05: qty 2

## 2019-11-05 NOTE — Progress Notes (Signed)
PT's daughter called for update.  Had talked with pt but she stated he sounded tired/slightly SOB thus calling Nurse for update.  Nurse updated on pt's status - being up in chair to assist in breathing, use of IS and use of HHFNC with NRB mask.  All combined helping maintaining O2 Sats in acceptable range.

## 2019-11-05 NOTE — Progress Notes (Signed)
Pharmacy Antibiotic Note  Levi Jones is a 69 y.o. male admitted on 11/19/2019 with COVID-19.  Pharmacy has been consulted for Unasyn dosing for aspiration PNA. Tm 99.3, WBC up to 20.9 (also on steroids).   Plan: Unasyn 3 g IV q6h Monitor renal function, clinical progress F/U LOT    Height: 6' (182.9 cm) Weight: 254 lb 3.1 oz (115.3 kg) IBW/kg (Calculated) : 77.6  Temp (24hrs), Avg:98.4 F (36.9 C), Min:97.8 F (36.6 C), Max:99.3 F (37.4 C)  Recent Labs  Lab 11/09/2019 0844 11/16/2019 1130 11/02/19 0412 11/03/19 0207 11/04/19 0106 11/05/19 0432  WBC 10.6*  --  9.5 13.6* 19.4* 20.9*  CREATININE 1.43*  --  1.10 1.11 1.25* 1.24  LATICACIDVEN 3.2* 2.2*  --   --   --   --     Estimated Creatinine Clearance: 75.8 mL/min (by C-G formula based on SCr of 1.24 mg/dL).    No Known Allergies   Thank you for involving pharmacy in this patient's care.  Renold Genta, PharmD, BCPS Clinical Pharmacist Clinical phone for 11/05/2019 until 3:30p is [phone] 11/05/2019 2:54 PM  **Pharmacist phone directory can be found on [email or web address] listed under [place]**

## 2019-11-05 NOTE — Progress Notes (Addendum)
PROGRESS NOTE                                                                                                                                                                                                             Patient Demographics:    Pratik Dalziel, is a 69 y.o. male, DOB - 07/05/1951, QPY:[id number]  Outpatient Primary MD for the patient is Colon Branch, MD    LOS - 4  Admit date - 11/11/2019    Chief Complaint  Patient presents with  . COVID 19  . Weakness       Brief Narrative  69 year old male with history of Sarcoidosis, HTN, HLD and BPH brought to ED by EMS due to worsening fatigue and shortness of breath for 2 to 3 days. Tested positive for COVID-19 on 12/25, he was admitted with severe hypoxic respiratory failure requiring nonrebreather mask and then 35 to 40 L of heated high flow oxygen.  He was treated with full extent of treatment including IV steroids, remdesivir, convalescent plasma and Actemra.  Transferred to [facility] for further care, transferred to my service on 11/05/2019.  He appears extremely tenuous with advanced acute parenchymal injury on top of his sarcoid lung.   Subjective:    Jaymian Bogart today has, No headache, No chest pain, No abdominal pain - No Nausea, No new weakness tingling or numbness, mildly improved shortness of breath   Assessment  & Plan :     1. Acute Hypoxic Resp. Failure due to Acute Covid 19 Viral Pneumonitis during the ongoing 2020 Covid 19 Pandemic - he has severe disease with underlying pulmonary sarcoidosis, still has severe acute hypoxic respiratory failure and currently on 30 L of heated high flow oxygen.  Still appears to be in somewhat in respiratory distress with few Rales so will add IV Lasix as well.  He has so far been treated with IV steroids, remdesivir, convalescent plasma and Actemra.  Continue steroids.  Continue to monitor closely.    His overall condition is  extremely tenuous and this was communicated to both patient and patient's wife by me personally on 11/05/2019, he may require intubation and his long-term prognosis might not be good despite that.  Encouraged the patient to sit up in chair in the daytime use I-S and flutter valve for pulmonary toiletry and then prone in  bed when at night.    Addendum seen patient the second time around 2 PM.  Chest x-ray repeat shows a right lower focal infiltrate, also continues to have leukocytosis although procalcitonin is negative, I suspect in the interim he has aspirated as well.  Will place him on Unasyn.  Again condition overall extremely tenuous.   SpO2: 96 % O2 Flow Rate (L/min): 30 L/min FiO2 (%): 100 %  Recent Labs  Lab 11/22/2019 0844 11/02/19 0412 11/03/19 0207 11/04/19 0106 11/04/19 1745 11/05/19 0432  CRP 18.5* 19.7* 11.2* 7.0*  --  3.7*  DDIMER 1.62* 1.90* 1.56* 1.32*  --  2.27*  FERRITIN 4,398* 2,701* 2,247* 1,705*  --   --   BNP  --   --   --   --  96.6 64.2  PROCALCITON 0.20  --   --   --  <0.10 <0.10    Hepatic Function Latest Ref Rng & Units 11/05/2019 11/04/2019 11/03/2019  Total Protein 6.5 - 8.1 g/dL 7.0 7.1 7.1  Albumin 3.5 - 5.0 g/dL 3.1(L) 3.1(L) 3.1(L)  AST 15 - 41 U/L 46(H) 59(H) 94(H)  ALT 0 - 44 U/L 92(H) 114(H) 123(H)  Alk Phosphatase 38 - 126 U/L 138(H) 111 88  Total Bilirubin 0.3 - 1.2 mg/dL 1.4(H) 0.9 1.3(H)  Bilirubin, Direct 0.00 - 0.40 mg/dL - - -    2.  History of sarcoidosis with possible pulmonary involvement on CT scan.  Supportive care for now.  Outpatient pulmonary follow-up post discharge.  3.  Possible underlying liver cirrhosis noted on CT scan.  Supportive care outpatient GI follow-up.  4.  Essential hypertension.  Currently on Norvasc.  5.  Dyslipidemia.  Continue statin.  6.  BPH.  Has been placed on Flomax.  7.  Leukocytosis with stable procalcitonin.  Repeat chest x-ray.  Question if he has aspirated as well.   8. Steroid-induced  hyperglycemia.  Currently on Lantus along with sliding scale, monitor and adjust.   Lab Results  Component Value Date   HGBA1C 5.3 09/28/2019    CBG (last 3)  Recent Labs    11/04/19 1107 11/04/19 1810 11/05/19 0811  GLUCAP 253* 200* 258*      Condition - Extremely Guarded  Family Communication  : Discussed with patient's wife in detail on 11/05/2019, explained guarded prognosis, also left for daughter Benjamine Mola on her listed cell phone on 11/05/2019 at 11:15 AM  Code Status : Full  Diet :    Diet Order            Diet Heart Room service appropriate? Yes; Fluid consistency: Thin  Diet effective now               Disposition Plan  : Stay in PCU  Consults  : None  Procedures  :    CT - 1. Examination for pulmonary embolism is somewhat limited by breath motion artifact, particularly in the lower lobes. Within this limitation, no evidence of pulmonary embolism through the segmental pulmonary arterial level. 2. Extensive bilateral heterogeneous and ground-glass airspace opacity. Findings are consistent with multifocal infection, edema, and/or ARDS. 3. Fibrotic change of the perihilar upper lobes, combined with numerous calcified bilateral hilar and mediastinal lymph nodes, constellation of findings generally consistent with fibrotic pulmonary sarcoidosis and similar to appearance on prior chest radiographs dated 07/27/2017. 4. Somewhat coarse appearing contour of the liver in the included upper abdomen, suggestive of cirrhosis.   PUD Prophylaxis :   DVT Prophylaxis  :  Lovenox  Lab Results  Component Value Date   PLT 363 11/05/2019    Inpatient Medications  Scheduled Meds: . albuterol  2 puff Inhalation Q6H  . amLODipine  10 mg Oral Daily  . vitamin C  500 mg Oral Daily  . aspirin EC  81 mg Oral Daily  . atorvastatin  20 mg Oral QHS  . Chlorhexidine Gluconate Cloth  6 each Topical Daily  . enoxaparin (LOVENOX) injection  40 mg Subcutaneous Q12H  . insulin aspart   0-20 Units Subcutaneous TID WC  . insulin aspart  0-5 Units Subcutaneous QHS  . insulin aspart  6 Units Subcutaneous TID WC  . insulin detemir  8 Units Subcutaneous QHS  . ipratropium  2 puff Inhalation Q6H  . linagliptin  5 mg Oral Daily  . loratadine  10 mg Oral Daily  . mouth rinse  15 mL Mouth Rinse BID  . methylPREDNISolone (SOLU-MEDROL) injection  60 mg Intravenous Q12H  . tamsulosin  0.4 mg Oral Daily  . zinc sulfate  220 mg Oral Daily   Continuous Infusions: PRN Meds:.acetaminophen, guaiFENesin-dextromethorphan, LORazepam, polyethylene glycol, senna-docusate  Antibiotics  :    Anti-infectives (From admission, onward)   Start     Dose/Rate Route Frequency Ordered Stop   11/02/19 1000  remdesivir 100 mg in sodium chloride 0.9 % 100 mL IVPB  Status:  Discontinued     100 mg 200 mL/hr over 30 Minutes Intravenous Daily 11/10/2019 1838 11/24/2019 1847   11/02/19 1000  remdesivir 100 mg in sodium chloride 0.9 % 100 mL IVPB     100 mg 200 mL/hr over 30 Minutes Intravenous Daily 11/04/2019 1054 11/05/19 1017   10/30/2019 1838  remdesivir 200 mg in sodium chloride 0.9% 250 mL IVPB  Status:  Discontinued     200 mg 580 mL/hr over 30 Minutes Intravenous Once 11/05/2019 1838 11/01/2019 1847   11/27/2019 1200  remdesivir 200 mg in sodium chloride 0.9% 250 mL IVPB     200 mg 580 mL/hr over 30 Minutes Intravenous Once 10/31/2019 1054 11/11/2019 1302       Time Spent in minutes  30   Lala Lund M.D on 11/05/2019 at 11:15 AM  To page go to [email or web address] - password [email or web address]  [facility] -  Office  [phone]    See all Orders from today for further details    Objective:   Vitals:   11/05/19 0952 11/05/19 1055 11/05/19 1057 11/05/19 1100  BP: 130/90  (!) 131/114 117/76  Pulse:  95 94 94  Resp:  (!) 21 (!) 25 (!) 29  Temp:      TempSrc:      SpO2:  (!) 87% 97% 96%  Weight:      Height:        Wt Readings from Last 3 Encounters:  11/02/19 115.3 kg  09/28/19 115.3 kg  06/01/19  114.1 kg     Intake/Output Summary (Last 24 hours) at 11/05/2019 1115 Last data filed at 11/05/2019 1000 Gross per 24 hour  Intake 336.8 ml  Output 1200 ml  Net -863.2 ml     Physical Exam  Awake Alert,  No new F.N deficits, Normal affect [place].AT,PERRAL Supple Neck,No JVD, No cervical lymphadenopathy appriciated.  Symmetrical Chest wall movement, Good air movement bilaterally, few rales RRR,No Gallops,Rubs or new Murmurs, No Parasternal Heave +ve B.Sounds, Abd Soft, No tenderness, No organomegaly appriciated, No rebound - guarding or rigidity. No Cyanosis, Clubbing or edema, No new Rash or bruise  Data Review:    CBC Recent Labs  Lab 11/27/2019 0844 11/02/19 0412 11/03/19 0207 11/04/19 0106 11/05/19 0432  WBC 10.6* 9.5 13.6* 19.4* 20.9*  HGB 12.0* 11.4* 11.5* 12.2* 12.8*  HCT 37.9* 37.4* 37.7* 39.4 42.0  PLT 228 241 266 367 363  MCV 88.3 90.1 90.0 89.7 91.5  MCH 28.0 27.5 27.4 27.8 27.9  MCHC 31.7 30.5 30.5 31.0 30.5  RDW 12.3 12.5 12.6 12.8 13.1  LYMPHSABS 0.7 1.0 0.7 1.0 0.8  MONOABS 0.6 0.6 0.3 0.8 0.8  EOSABS 0.0 0.0 0.0 0.0 0.0  BASOSABS 0.0 0.1 0.0 0.1 0.1    Chemistries  Recent Labs  Lab 11/03/2019 0844 11/02/19 0412 11/03/19 0207 11/04/19 0106 11/05/19 0432  NA 139 139 140 142 143  K 3.9 4.3 4.1 3.9 4.5  CL 101 105 104 107 106  CO2 25 21* '23 24 25  ' GLUCOSE 203* 219* 239* 256* 205*  BUN 17 21 27* 30* 37*  CREATININE 1.43* 1.10 1.11 1.25* 1.24  CALCIUM 8.4* 8.2* 8.3* 8.4* 8.3*  MG  --   --   --   --  2.8*  AST 119* 133* 94* 59* 46*  ALT 114* 117* 123* 114* 92*  ALKPHOS 64 72 88 111 138*  BILITOT 1.7* 1.0 1.3* 0.9 1.4*   ------------------------------------------------------------------------------------------------------------------ No results for input(s): CHOL, HDL, LDLCALC, TRIG, CHOLHDL, LDLDIRECT in the last 72 hours.  Lab Results  Component Value Date   HGBA1C 5.3 09/28/2019    ------------------------------------------------------------------------------------------------------------------ No results for input(s): TSH, T4TOTAL, T3FREE, THYROIDAB in the last 72 hours.  Invalid input(s): FREET3  Cardiac Enzymes No results for input(s): CKMB, TROPONINI, MYOGLOBIN in the last 168 hours.  Invalid input(s): CK ------------------------------------------------------------------------------------------------------------------    Component Value Date/Time   BNP 64.2 11/05/2019 0432    Micro Results Recent Results (from the past 240 hour(s))  Blood Culture (routine x 2)     Status: None (Preliminary result)   Collection Time: 10/28/2019  8:44 AM   Specimen: BLOOD  Result Value Ref Range Status   Specimen Description   Final    BLOOD RIGHT ANTECUBITAL Performed at [facility] [address] [place], [place] [address]    Special Requests   Final    BOTTLES DRAWN AEROBIC AND ANAEROBIC Blood Culture adequate volume Performed at [facility] [address] [place], [place] [address]    Culture   Final    NO GROWTH 4 DAYS Performed at [facility], [place] [address], [place], [place] [address]    Report Status PENDING  Incomplete  MRSA PCR Screening     Status: None   Collection Time: 11/03/19  3:31 AM   Specimen: Nasal Mucosa; Nasopharyngeal  Result Value Ref Range Status   MRSA by PCR NEGATIVE NEGATIVE Final    Comment:        The GeneXpert MRSA Assay (FDA approved for NASAL specimens only), is one component of a comprehensive MRSA colonization surveillance program. It is not intended to diagnose MRSA infection nor to guide or monitor treatment for MRSA infections. Performed at [facility], [place] [address] [place], [place] [address]     Radiology Reports CT ANGIO CHEST PE W OR WO CONTRAST  Result Date: 11/03/2019 CLINICAL DATA:  Shortness of breath, elevated D-dimer, hypoxia EXAM: CT  ANGIOGRAPHY CHEST WITH CONTRAST TECHNIQUE: Multidetector CT imaging of the chest was performed using the standard protocol during bolus administration of intravenous contrast. Multiplanar CT image reconstructions and  MIPs were obtained to evaluate the vascular anatomy. CONTRAST:  141m OMNIPAQUE IOHEXOL 350 MG/ML SOLN COMPARISON:  07/27/2017 FINDINGS: Cardiovascular: Examination for pulmonary embolism is somewhat limited by breath motion artifact, particularly in the lower lobes. Within this limitation, no evidence of pulmonary embolism through the segmental pulmonary arterial level. Normal heart size. No pericardial effusion. Mediastinum/Nodes: Numerous calcified bilateral hilar and mediastinal lymph nodes. Thyroid gland, trachea, and esophagus demonstrate no significant findings. Lungs/Pleura: Extensive bilateral heterogeneous and ground-glass airspace opacity. Fibrotic change of the perihilar upper lobes (series 6, image 65). Upper Abdomen: No acute abnormality. Somewhat coarse appearing contour of the liver in the included upper abdomen. Musculoskeletal: No chest wall abnormality. No acute or significant osseous findings. Review of the MIP images confirms the above findings. IMPRESSION: 1. Examination for pulmonary embolism is somewhat limited by breath motion artifact, particularly in the lower lobes. Within this limitation, no evidence of pulmonary embolism through the segmental pulmonary arterial level. 2. Extensive bilateral heterogeneous and ground-glass airspace opacity. Findings are consistent with multifocal infection, edema, and/or ARDS. 3. Fibrotic change of the perihilar upper lobes, combined with numerous calcified bilateral hilar and mediastinal lymph nodes, constellation of findings generally consistent with fibrotic pulmonary sarcoidosis and similar to appearance on prior chest radiographs dated 07/27/2017. 4. Somewhat coarse appearing contour of the liver in the included upper abdomen, suggestive  of cirrhosis. Electronically Signed   By: AEddie CandleM.D.   On: 11/03/2019 12:33   DG Chest Port 1 View  Result Date: 11/05/2019 CLINICAL DATA:  Shortness of breath EXAM: PORTABLE CHEST 1 VIEW COMPARISON:  November 01, 2019 FINDINGS: Evaluation is limited due to low lung volumes. Bilateral pulmonary infiltrates persist. The infiltrate in the right base is more prominent the interval. The infiltrate on the left may be a little improved in the interval. No change in the cardiomediastinal silhouette. No pneumothorax. IMPRESSION: 1. Evaluation is limited due to the low volume technique. 2. The right basilar opacity is more focal in the interval. Left-sided infiltrate may be mildly improved in the interval. Recommend attention on follow-up. Electronically Signed   By: DDorise BullionIII M.D   On: 11/05/2019 09:42   DG Chest Port 1 View  Result Date: 11/11/2019 CLINICAL DATA:  Shortness of breath, worsening COVID symptoms, decreased O2 sats. EXAM: PORTABLE CHEST 1 VIEW COMPARISON:  Chest radiograph 04/30/2017 FINDINGS: Heart size at the upper limits of normal. Ill-defined bilateral airspace opacities with a mid to lower lung predominance. No sizable pleural effusion or evidence of pneumothorax. No acute bony abnormality. IMPRESSION: Bilateral ill-defined airspace opacities with a mid to lower lung predominance. Findings likely reflect atypical/viral pneumonia given provided history of COVID. Radiographic follow-up to resolution recommended. Electronically Signed   By: KKellie SimmeringDO   On: 11/10/2019 08:33

## 2019-11-05 NOTE — Progress Notes (Signed)
Pt's spouse called.  Updated given on pt's status.  Pt remains on [place] with NRB Mask, keeping sats 92% or better with this.  Pt's spouse requested a clip to play for pt tonight 0 Healing Prayer by Pierce Crane on youtube.  Nurse able to pull up on pt's phone and played it low on pt's phone.

## 2019-11-05 NOTE — Plan of Care (Signed)
Updated pt on POC, pt verbalized understanding 

## 2019-11-06 LAB — PROCALCITONIN: Procalcitonin: <0.1 ng/mL

## 2019-11-06 LAB — CBC WITH DIFFERENTIAL/PLATELET
Abs Immature Granulocytes: 0.67 10*3/uL — ABNORMAL HIGH (ref 0.00–0.07)
Basophils Absolute: 0 10*3/uL (ref 0.0–0.1)
Basophils Relative: 0 %
Eosinophils Absolute: 0 10*3/uL (ref 0.0–0.5)
Eosinophils Relative: 0 %
HCT: 41.1 % (ref 39.0–52.0)
Hemoglobin: 12.9 g/dL — ABNORMAL LOW (ref 13.0–17.0)
Immature Granulocytes: 3 %
Lymphocytes Relative: 3 %
Lymphs Abs: 0.7 10*3/uL (ref 0.7–4.0)
MCH: 27.8 pg (ref 26.0–34.0)
MCHC: 31.4 g/dL (ref 30.0–36.0)
MCV: 88.6 fL (ref 80.0–100.0)
Monocytes Absolute: 0.5 10*3/uL (ref 0.1–1.0)
Monocytes Relative: 3 %
Neutro Abs: 20.1 10*3/uL — ABNORMAL HIGH (ref 1.7–7.7)
Neutrophils Relative %: 91 %
Platelets: 382 10*3/uL (ref 150–400)
RBC: 4.64 MIL/uL (ref 4.22–5.81)
RDW: 13.3 % (ref 11.5–15.5)
WBC: 22 10*3/uL — ABNORMAL HIGH (ref 4.0–10.5)
nRBC: 0.5 % — ABNORMAL HIGH (ref 0.0–0.2)

## 2019-11-06 LAB — COMPREHENSIVE METABOLIC PANEL
ALT: 74 U/L — ABNORMAL HIGH (ref 0–44)
AST: 34 U/L (ref 15–41)
Albumin: 3.1 g/dL — ABNORMAL LOW (ref 3.5–5.0)
Alkaline Phosphatase: 120 U/L (ref 38–126)
Anion gap: 13 (ref 5–15)
BUN: 44 mg/dL — ABNORMAL HIGH (ref 8–23)
CO2: 23 mmol/L (ref 22–32)
Calcium: 8 mg/dL — ABNORMAL LOW (ref 8.9–10.3)
Chloride: 106 mmol/L (ref 98–111)
Creatinine, Ser: 1.41 mg/dL — ABNORMAL HIGH (ref 0.61–1.24)
GFR calc Af Amer: 59 mL/min — ABNORMAL LOW (ref >60)
GFR calc non Af Amer: 51 mL/min — ABNORMAL LOW (ref >60)
Glucose, Bld: 184 mg/dL — ABNORMAL HIGH (ref 70–99)
Potassium: 3.7 mmol/L (ref 3.5–5.1)
Sodium: 142 mmol/L (ref 135–145)
Total Bilirubin: 1.5 mg/dL — ABNORMAL HIGH (ref 0.3–1.2)
Total Protein: 6.7 g/dL (ref 6.5–8.1)

## 2019-11-06 LAB — BRAIN NATRIURETIC PEPTIDE: B Natriuretic Peptide: 40.3 pg/mL (ref 0.0–100.0)

## 2019-11-06 LAB — CULTURE, BLOOD (ROUTINE X 2)
Culture: NO GROWTH
Special Requests: ADEQUATE

## 2019-11-06 LAB — GLUCOSE, CAPILLARY
Glucose-Capillary: 248 mg/dL — ABNORMAL HIGH (ref 70–99)
Glucose-Capillary: 281 mg/dL — ABNORMAL HIGH (ref 70–99)
Glucose-Capillary: 311 mg/dL — ABNORMAL HIGH (ref 70–99)
Glucose-Capillary: 135 mg/dL — ABNORMAL HIGH (ref 70–99)

## 2019-11-06 LAB — C-REACTIVE PROTEIN: CRP: 2.1 mg/dL — ABNORMAL HIGH (ref <1.0)

## 2019-11-06 LAB — D-DIMER, QUANTITATIVE: D-Dimer, Quant: 6.38 ug/mL-FEU — ABNORMAL HIGH (ref 0.00–0.50)

## 2019-11-06 LAB — MAGNESIUM: Magnesium: 2.8 mg/dL — ABNORMAL HIGH (ref 1.7–2.4)

## 2019-11-06 MED ORDER — POTASSIUM CHLORIDE CRYS ER 20 MEQ PO TBCR
40.0000 meq | EXTENDED_RELEASE_TABLET | Freq: Once | ORAL | Status: AC
  Administered 2019-11-06: 40 meq via ORAL
  Filled 2019-11-06: qty 2

## 2019-11-06 MED ORDER — METHYLPREDNISOLONE SODIUM SUCC 125 MG IJ SOLR
60.0000 mg | Freq: Every day | INTRAMUSCULAR | Status: AC
  Administered 2019-11-06 – 2019-11-10 (×5): 60 mg via INTRAVENOUS
  Filled 2019-11-06 (×5): qty 2

## 2019-11-06 MED ORDER — LORAZEPAM 0.5 MG PO TABS
0.5000 mg | ORAL_TABLET | Freq: Once | ORAL | Status: AC
  Administered 2019-11-06: 0.5 mg via ORAL
  Filled 2019-11-06: qty 1

## 2019-11-06 MED ORDER — FUROSEMIDE 10 MG/ML IJ SOLN: 20.0000 mg | Freq: Once | INTRAMUSCULAR | Status: DC

## 2019-11-06 NOTE — Progress Notes (Signed)
Patient's wife updated on patient's condition and aware of confusion. Informed he is able to reorient, but stating he needs to go to [place]. She states his daughter, Benjamine Mola, lives in [place] and he hasn't talked to her in a couple of days. Will update the patient.

## 2019-11-06 NOTE — Progress Notes (Signed)
PROGRESS NOTE                                                                                                                                                                                                             Patient Demographics:    Levi Jones, is a 69 y.o. male, DOB - [birth year], IOX:[id number]  Outpatient Primary MD for the patient is Colon Branch, MD    LOS - 5  Admit date - 11/14/2019    Chief Complaint  Patient presents with  . COVID 19  . Weakness       Brief Narrative  69 year old male with history of Sarcoidosis, HTN, HLD and BPH brought to ED by EMS due to worsening fatigue and shortness of breath for 2 to 3 days. Tested positive for COVID-19 on 12/25, he was admitted with severe hypoxic respiratory failure requiring nonrebreather mask and then 35 to 40 L of heated high flow oxygen.  He was treated with full extent of treatment including IV steroids, remdesivir, convalescent plasma and Actemra.  Transferred to [facility] for further care, transferred to my service on 11/05/2019.  He appears extremely tenuous with advanced acute parenchymal injury on top of his sarcoid lung.   Subjective:   Patient in bed, appears comfortable, denies any headache, no fever, no chest pain or pressure, mild improvement in shortness of breath , no abdominal pain. No focal weakness.    Assessment  & Plan :     1. Acute Hypoxic Resp. Failure due to Acute Covid 19 Viral Pneumonitis during the ongoing 2020 Covid 19 Pandemic - he has severe disease with underlying pulmonary sarcoidosis, still has severe acute hypoxic respiratory failure and currently on 30 L of heated high flow oxygen + NRB.   He likely has CHF + possible Asp.PNA - lasix + Unasyn + Steroids for now, overall extremely guarded prognosis.   His overall condition is extremely tenuous and this was communicated to both patient and patient's wife by me personally on 11/05/2019,  he may require intubation and his long-term prognosis might not be good despite that.  Encouraged the patient to sit up in chair in the daytime use I-S and flutter valve for pulmonary toiletry and then prone in bed when at night.      SpO2: 91 % O2 Flow Rate (L/min): 25 L/min FiO2 (%):  60 %  Recent Labs  Lab 11/16/2019 0844 11/02/19 0412 11/03/19 0207 11/04/19 0106 11/04/19 1745 11/05/19 0432 11/06/19 0210  CRP 18.5* 19.7* 11.2* 7.0*  --  3.7* 2.1*  DDIMER 1.62* 1.90* 1.56* 1.32*  --  2.27* 6.38*  FERRITIN 4,398* 2,701* 2,247* 1,705*  --   --   --   BNP  --   --   --   --  96.6 64.2 40.3  PROCALCITON 0.20  --   --   --  <0.10 <0.10 <0.10    Hepatic Function Latest Ref Rng & Units 11/06/2019 11/05/2019 11/04/2019  Total Protein 6.5 - 8.1 g/dL 6.7 7.0 7.1  Albumin 3.5 - 5.0 g/dL 3.1(L) 3.1(L) 3.1(L)  AST 15 - 41 U/L 34 46(H) 59(H)  ALT 0 - 44 U/L 74(H) 92(H) 114(H)  Alk Phosphatase 38 - 126 U/L 120 138(H) 111  Total Bilirubin 0.3 - 1.2 mg/dL 1.5(H) 1.4(H) 0.9  Bilirubin, Direct 0.00 - 0.40 mg/dL - - -    2.  History of sarcoidosis with possible pulmonary involvement on CT scan.  Supportive care for now.  Outpatient pulmonary follow-up post discharge.  3.  Possible underlying liver cirrhosis noted on CT scan.  Supportive care outpatient GI follow-up.  4.  Essential hypertension.  Currently on Norvasc.  5.  Dyslipidemia.  Continue statin.  6.  BPH.  Has been placed on Flomax.  7.  Leukocytosis with stable procalcitonin.  Repeat chest x-ray shows possible a new RLL focal infiltrate - see # 8.   8. Asp PNA - speech eval, Unasyn.  9. Steroid-induced hyperglycemia.  Currently on Lantus along with sliding scale, monitor and adjust.   Lab Results  Component Value Date   HGBA1C 5.3 09/28/2019    CBG (last 3)  Recent Labs    11/05/19 1619 11/05/19 2028 11/06/19 0841  GLUCAP 186* 175* 248*      Condition - Extremely Guarded  Family Communication  : Discussed with  patient's wife in detail on 11/05/2019, explained guarded prognosis, also left for daughter Benjamine Mola on her listed cell phone on 11/05/2019 at 11:15 AM  Code Status : Full  Diet :    Diet Order            Diet Heart Room service appropriate? Yes; Fluid consistency: Thin  Diet effective now               Disposition Plan  : Stay in PCU  Consults  : None  Procedures  :    CT - 1. Examination for pulmonary embolism is somewhat limited by breath motion artifact, particularly in the lower lobes. Within this limitation, no evidence of pulmonary embolism through the segmental pulmonary arterial level. 2. Extensive bilateral heterogeneous and ground-glass airspace opacity. Findings are consistent with multifocal infection, edema, and/or ARDS. 3. Fibrotic change of the perihilar upper lobes, combined with numerous calcified bilateral hilar and mediastinal lymph nodes, constellation of findings generally consistent with fibrotic pulmonary sarcoidosis and similar to appearance on prior chest radiographs dated 07/27/2017. 4. Somewhat coarse appearing contour of the liver in the included upper abdomen, suggestive of cirrhosis.   PUD Prophylaxis :   DVT Prophylaxis  :  Lovenox   Lab Results  Component Value Date   PLT 382 11/06/2019    Inpatient Medications  Scheduled Meds: . albuterol  2 puff Inhalation Q6H  . amLODipine  10 mg Oral Daily  . vitamin C  500 mg Oral Daily  . aspirin EC  81 mg  Oral Daily  . atorvastatin  20 mg Oral QHS  . Chlorhexidine Gluconate Cloth  6 each Topical Daily  . enoxaparin (LOVENOX) injection  40 mg Subcutaneous Q12H  . insulin aspart  0-20 Units Subcutaneous TID WC  . insulin aspart  0-5 Units Subcutaneous QHS  . insulin aspart  6 Units Subcutaneous TID WC  . insulin detemir  8 Units Subcutaneous QHS  . ipratropium  2 puff Inhalation Q6H  . linagliptin  5 mg Oral Daily  . loratadine  10 mg Oral Daily  . mouth rinse  15 mL Mouth Rinse BID  .  methylPREDNISolone (SOLU-MEDROL) injection  60 mg Intravenous Daily  . tamsulosin  0.4 mg Oral Daily  . zinc sulfate  220 mg Oral Daily   Continuous Infusions: . ampicillin-sulbactam (UNASYN) IV 3 g (11/06/19 0855)   PRN Meds:.acetaminophen, guaiFENesin-dextromethorphan, LORazepam, polyethylene glycol, senna-docusate  Antibiotics  :    Anti-infectives (From admission, onward)   Start     Dose/Rate Route Frequency Ordered Stop   11/05/19 1600  Ampicillin-Sulbactam (UNASYN) 3 g in sodium chloride 0.9 % 100 mL IVPB     3 g 200 mL/hr over 30 Minutes Intravenous Every 6 hours 11/05/19 1454     11/02/19 1000  remdesivir 100 mg in sodium chloride 0.9 % 100 mL IVPB  Status:  Discontinued     100 mg 200 mL/hr over 30 Minutes Intravenous Daily 11/09/2019 1838 11/05/2019 1847   11/02/19 1000  remdesivir 100 mg in sodium chloride 0.9 % 100 mL IVPB     100 mg 200 mL/hr over 30 Minutes Intravenous Daily 11/07/2019 1054 11/05/19 1017   11/22/2019 1838  remdesivir 200 mg in sodium chloride 0.9% 250 mL IVPB  Status:  Discontinued     200 mg 580 mL/hr over 30 Minutes Intravenous Once 11/25/2019 1838 11/08/2019 1847   11/05/2019 1200  remdesivir 200 mg in sodium chloride 0.9% 250 mL IVPB     200 mg 580 mL/hr over 30 Minutes Intravenous Once 11/05/2019 1054 11/11/2019 1302       Time Spent in minutes  30   Lala Lund M.D on 11/06/2019 at 11:28 AM  To page go to [email or web address] - password [email or web address]  [facility] -  Office  [phone]    See all Orders from today for further details    Objective:   Vitals:   11/06/19 0100 11/06/19 0400 11/06/19 0600 11/06/19 0800  BP: 95/71 113/74 111/72 99/77  Pulse: 92 94 95 96  Resp: (!) 35 20 (!) 25 (!) 23  Temp:  (!) 97.2 F (36.2 C)  (!) 97.2 F (36.2 C)  TempSrc:  Axillary  Axillary  SpO2: 95% 97% 96% 91%  Weight:      Height:        Wt Readings from Last 3 Encounters:  11/02/19 115.3 kg  09/28/19 115.3 kg  06/01/19 114.1 kg     Intake/Output  Summary (Last 24 hours) at 11/06/2019 1128 Last data filed at 11/06/2019 0425 Gross per 24 hour  Intake 920 ml  Output 1600 ml  Net -680 ml     Physical Exam  Awake Alert,  No new F.N deficits, Normal affect [place].AT,PERRAL Supple Neck,No JVD, No cervical lymphadenopathy appriciated.  Symmetrical Chest wall movement, Good air movement bilaterally, few rales RRR,No Gallops, Rubs or new Murmurs, No Parasternal Heave +ve B.Sounds, Abd Soft, No tenderness, No organomegaly appriciated, No rebound - guarding or rigidity. No Cyanosis, Clubbing or edema  Data Review:    CBC Recent Labs  Lab 11/02/19 0412 11/03/19 0207 11/04/19 0106 11/05/19 0432 11/06/19 0210  WBC 9.5 13.6* 19.4* 20.9* 22.0*  HGB 11.4* 11.5* 12.2* 12.8* 12.9*  HCT 37.4* 37.7* 39.4 42.0 41.1  PLT 241 266 367 363 382  MCV 90.1 90.0 89.7 91.5 88.6  MCH 27.5 27.4 27.8 27.9 27.8  MCHC 30.5 30.5 31.0 30.5 31.4  RDW 12.5 12.6 12.8 13.1 13.3  LYMPHSABS 1.0 0.7 1.0 0.8 0.7  MONOABS 0.6 0.3 0.8 0.8 0.5  EOSABS 0.0 0.0 0.0 0.0 0.0  BASOSABS 0.1 0.0 0.1 0.1 0.0    Chemistries  Recent Labs  Lab 11/02/19 0412 11/03/19 0207 11/04/19 0106 11/05/19 0432 11/06/19 0210  NA 139 140 142 143 142  K 4.3 4.1 3.9 4.5 3.7  CL 105 104 107 106 106  CO2 21* '23 24 25 23  ' GLUCOSE 219* 239* 256* 205* 184*  BUN 21 27* 30* 37* 44*  CREATININE 1.10 1.11 1.25* 1.24 1.41*  CALCIUM 8.2* 8.3* 8.4* 8.3* 8.0*  MG  --   --   --  2.8* 2.8*  AST 133* 94* 59* 46* 34  ALT 117* 123* 114* 92* 74*  ALKPHOS 72 88 111 138* 120  BILITOT 1.0 1.3* 0.9 1.4* 1.5*   ------------------------------------------------------------------------------------------------------------------ No results for input(s): CHOL, HDL, LDLCALC, TRIG, CHOLHDL, LDLDIRECT in the last 72 hours.  Lab Results  Component Value Date   HGBA1C 5.3 09/28/2019    ------------------------------------------------------------------------------------------------------------------ No results for input(s): TSH, T4TOTAL, T3FREE, THYROIDAB in the last 72 hours.  Invalid input(s): FREET3  Cardiac Enzymes No results for input(s): CKMB, TROPONINI, MYOGLOBIN in the last 168 hours.  Invalid input(s): CK ------------------------------------------------------------------------------------------------------------------    Component Value Date/Time   BNP 40.3 11/06/2019 0210    Micro Results Recent Results (from the past 240 hour(s))  Blood Culture (routine x 2)     Status: None (Preliminary result)   Collection Time: 11/12/2019  8:44 AM   Specimen: BLOOD  Result Value Ref Range Status   Specimen Description   Final    BLOOD RIGHT ANTECUBITAL Performed at [facility] [address], [place], [place] [address]    Special Requests   Final    BOTTLES DRAWN AEROBIC AND ANAEROBIC Blood Culture adequate volume Performed at [facility] [address] [place], [place] [address]    Culture   Final    NO GROWTH 4 DAYS Performed at [facility], [place] [address], [place], [place] [address]    Report Status PENDING  Incomplete  MRSA PCR Screening     Status: None   Collection Time: 11/03/19  3:31 AM   Specimen: Nasal Mucosa; Nasopharyngeal  Result Value Ref Range Status   MRSA by PCR NEGATIVE NEGATIVE Final    Comment:        The GeneXpert MRSA Assay (FDA approved for NASAL specimens only), is one component of a comprehensive MRSA colonization surveillance program. It is not intended to diagnose MRSA infection nor to guide or monitor treatment for MRSA infections. Performed at [facility], [place] [address], [place], [place] [address]     Radiology Reports CT ANGIO CHEST PE W OR WO CONTRAST  Result Date: 11/03/2019 CLINICAL DATA:  Shortness of breath, elevated D-dimer, hypoxia EXAM:  CT ANGIOGRAPHY CHEST WITH CONTRAST TECHNIQUE: Multidetector CT imaging of the chest was performed using the standard protocol during bolus administration of intravenous contrast. Multiplanar CT image reconstructions and MIPs were  obtained to evaluate the vascular anatomy. CONTRAST:  136m OMNIPAQUE IOHEXOL 350 MG/ML SOLN COMPARISON:  07/27/2017 FINDINGS: Cardiovascular: Examination for pulmonary embolism is somewhat limited by breath motion artifact, particularly in the lower lobes. Within this limitation, no evidence of pulmonary embolism through the segmental pulmonary arterial level. Normal heart size. No pericardial effusion. Mediastinum/Nodes: Numerous calcified bilateral hilar and mediastinal lymph nodes. Thyroid gland, trachea, and esophagus demonstrate no significant findings. Lungs/Pleura: Extensive bilateral heterogeneous and ground-glass airspace opacity. Fibrotic change of the perihilar upper lobes (series 6, image 65). Upper Abdomen: No acute abnormality. Somewhat coarse appearing contour of the liver in the included upper abdomen. Musculoskeletal: No chest wall abnormality. No acute or significant osseous findings. Review of the MIP images confirms the above findings. IMPRESSION: 1. Examination for pulmonary embolism is somewhat limited by breath motion artifact, particularly in the lower lobes. Within this limitation, no evidence of pulmonary embolism through the segmental pulmonary arterial level. 2. Extensive bilateral heterogeneous and ground-glass airspace opacity. Findings are consistent with multifocal infection, edema, and/or ARDS. 3. Fibrotic change of the perihilar upper lobes, combined with numerous calcified bilateral hilar and mediastinal lymph nodes, constellation of findings generally consistent with fibrotic pulmonary sarcoidosis and similar to appearance on prior chest radiographs dated 07/27/2017. 4. Somewhat coarse appearing contour of the liver in the included upper abdomen,  suggestive of cirrhosis. Electronically Signed   By: AEddie CandleM.D.   On: 11/03/2019 12:33   DG Chest Port 1 View  Result Date: 11/05/2019 CLINICAL DATA:  Shortness of breath EXAM: PORTABLE CHEST 1 VIEW COMPARISON:  November 01, 2019 FINDINGS: Evaluation is limited due to low lung volumes. Bilateral pulmonary infiltrates persist. The infiltrate in the right base is more prominent the interval. The infiltrate on the left may be a little improved in the interval. No change in the cardiomediastinal silhouette. No pneumothorax. IMPRESSION: 1. Evaluation is limited due to the low volume technique. 2. The right basilar opacity is more focal in the interval. Left-sided infiltrate may be mildly improved in the interval. Recommend attention on follow-up. Electronically Signed   By: DDorise BullionIII M.D   On: 11/05/2019 09:42   DG Chest Port 1 View  Result Date: 11/15/2019 CLINICAL DATA:  Shortness of breath, worsening COVID symptoms, decreased O2 sats. EXAM: PORTABLE CHEST 1 VIEW COMPARISON:  Chest radiograph 04/30/2017 FINDINGS: Heart size at the upper limits of normal. Ill-defined bilateral airspace opacities with a mid to lower lung predominance. No sizable pleural effusion or evidence of pneumothorax. No acute bony abnormality. IMPRESSION: Bilateral ill-defined airspace opacities with a mid to lower lung predominance. Findings likely reflect atypical/viral pneumonia given provided history of COVID. Radiographic follow-up to resolution recommended. Electronically Signed   By: KKellie SimmeringDO   On: 11/14/2019 08:33

## 2019-11-06 NOTE — Evaluation (Signed)
Physical Therapy Evaluation Patient Details Name: Levi Jones MRN: [id number] DOB: 01/02/1951 Today's Date: 11/06/2019   History of Present Illness  69 year old male patient admitted with COVID/acute respiratory failure; PMH includes Sarcoidosis,HTN, HLD, and BPH- condition (per chart) noted to be tenuous. Cleared with RN to perform PT evaluation  Clinical Impression  Very pleasant patient, but tended to moan throughout PT evaluation. Stated he was not in any pain, but generally tired and his muscles were weak and tired. He lives with his spouse in a one story home. Walk in shower, safety bars and standard height commode. He relates that he did not use any AD for ambulation- but that his wife had been helping him in bathing and dressing. Indicates he has had Sarcoidosis "for several years", but was not using oxygen at home. Currently on HHF 25 L-[place]/60% NRB . Should benefit from PT to address goals as detailed. Patient presents as having reduced insight into his currently medical situation.    Follow Up Recommendations Home health PT(He repeated several times during EVAL that "he just wants to go home")    Equipment Recommendations       Recommendations for Other Services       Precautions / Restrictions Precautions Precautions: Fall Restrictions Weight Bearing Restrictions: No Other Position/Activity Restrictions: Monitor closely as he is on HHF 25L and 60% NRB- generalized weakness. Reduced energy conservation.      Mobility  Bed Mobility Overal bed mobility: Needs Assistance Bed Mobility: Rolling;Sidelying to Sit;Supine to Sit;Sit to Supine;Sit to Sidelying Rolling: Min assist Sidelying to sit: Mod assist   Sit to supine: Mod assist Sit to sidelying: Min assist General bed mobility comments: Per RN , nursing has been ambulating him to and from [facility] commode.  Transfers Overall transfer level: Needs assistance Equipment used: 2 person hand held assist(May need to transition  to RW depending on overall progression, and improvement in energy conservation) Transfers: Sit to/from Stand Sit to Stand: Min assist;Mod assist            Ambulation/Gait Ambulation/Gait assistance: Mod assist Gait Distance (Feet): 8 Feet Assistive device: 2 person hand held assist Gait Pattern/deviations: Wide base of support;Shuffle        Stairs            Wheelchair Mobility    Modified Rankin (Stroke Patients Only)       Balance Overall balance assessment: Mild deficits observed, not formally tested                                           Pertinent Vitals/Pain Pain Assessment: No/denies pain(states he just feels exhausted , general muscle weakness/aching, but no "pain")    Home Living Family/patient expects to be discharged to:: Private residence Living Arrangements: Spouse/significant other   Type of Home: House       Home Layout: One level Home Equipment: Grab bars - tub/shower Additional Comments: Was noted to be independent in ambulation, no AD, but states that his wife provides all driving , and assisted his bathing and dressing.    Prior Function Level of Independence: Needs assistance   Gait / Transfers Assistance Needed: Independent in ambulation per patient, no AD  ADL's / Homemaking Assistance Needed: Per spouse  Comments: He relates that his spouse assists him in bathing and dressing- has had Sarcoidosis "for several years"     Hand  Dominance   Dominant Hand: Right    Extremity/Trunk Assessment   Upper Extremity Assessment Upper Extremity Assessment: (suggest OT consultation.)    Lower Extremity Assessment Lower Extremity Assessment: Defer to PT evaluation;Generalized weakness    Cervical / Trunk Assessment Cervical / Trunk Assessment: Normal  Communication   Communication: No difficulties  Cognition Arousal/Alertness: Awake/alert Behavior During Therapy: Anxious;Restless Overall Cognitive Status:  Within Functional Limits for tasks assessed                                 General Comments: Tended to moan throughout PT evaluation, but could not identify any pain - "just general discomfort"      General Comments General comments (skin integrity, edema, etc.): Fair tone and turgor ; no edema noted during PT evaluation    Exercises General Exercises - Lower Extremity Ankle Circles/Pumps: Seated;AROM(He had difficulty staying on task- tending to keep eyes closed and moaning, but would follow through with directives) Short Arc Quad: AROM;Seated Hip ABduction/ADduction: AROM;Seated Other Exercises Other Exercises: Reviewed IS and able to achieve 750   for 5 out of 8 attempts. Reminded to practice this ex every hour for 8-10 breaths. Good return demo.   Assessment/Plan    PT Assessment Patient needs continued PT services  PT Problem List Decreased strength;Decreased activity tolerance;Decreased mobility;Cardiopulmonary status limiting activity       PT Treatment Interventions Gait training;Functional mobility training;Therapeutic activities    PT Goals (Current goals can be found in the Care Plan section)  Acute Rehab PT Goals Patient Stated Goal: Patient just states that he is "anxious to go home" PT Goal Formulation: With patient Time For Goal Achievement: 11/20/19 Potential to Achieve Goals: Fair    Frequency Min 3X/week   Barriers to discharge        Co-evaluation               AM-PAC PT "6 Clicks" Mobility  Outcome Measure Help needed turning from your back to your side while in a flat bed without using bedrails?: A Little Help needed moving from lying on your back to sitting on the side of a flat bed without using bedrails?: A Little Help needed moving to and from a bed to a chair (including a wheelchair)?: A Little Help needed standing up from a chair using your arms (e.g., wheelchair or bedside chair)?: A Little Help needed to walk in hospital  room?: A Little Help needed climbing 3-5 steps with a railing? : A Lot 6 Click Score: 17    End of Session   Activity Tolerance: Patient limited by fatigue Patient left: in chair;with call bell/phone within reach   PT Visit Diagnosis: Muscle weakness (generalized) (M62.81)    Time: 0110-0150 PT Time Calculation (min) (ACUTE ONLY): 40 min   Charges:   PT Evaluation $PT Eval Moderate Complexity: 1 Mod PT Treatments $Therapeutic Exercise: 8-22 mins $Therapeutic Activity: 8-22 mins        Rollen Sox, PT # [phone] CGV cell   Casandra Doffing 11/06/2019, 2:18 PM

## 2019-11-07 LAB — POCT I-STAT 7, (LYTES, BLD GAS, ICA,H+H)
Bicarbonate: 22.1 mmol/L (ref 20.0–28.0)
Calcium, Ion: 1.16 mmol/L (ref 1.15–1.40)
HCT: 38 % — ABNORMAL LOW (ref 39.0–52.0)
Hemoglobin: 12.9 g/dL — ABNORMAL LOW (ref 13.0–17.0)
O2 Saturation: 84 %
Patient temperature: 97.8
Potassium: 3.7 mmol/L (ref 3.5–5.1)
Sodium: 146 mmol/L — ABNORMAL HIGH (ref 135–145)
TCO2: 23 mmol/L (ref 22–32)
pCO2 arterial: 28.2 mmHg — ABNORMAL LOW (ref 32.0–48.0)
pH, Arterial: 7.501 — ABNORMAL HIGH (ref 7.350–7.450)
pO2, Arterial: 42 mmHg — ABNORMAL LOW (ref 83.0–108.0)

## 2019-11-07 LAB — CBC WITH DIFFERENTIAL/PLATELET
Abs Immature Granulocytes: 0.44 10*3/uL — ABNORMAL HIGH (ref 0.00–0.07)
Basophils Absolute: 0.1 10*3/uL (ref 0.0–0.1)
Basophils Relative: 0 %
Eosinophils Absolute: 0 10*3/uL (ref 0.0–0.5)
Eosinophils Relative: 0 %
HCT: 45.8 % (ref 39.0–52.0)
Hemoglobin: 14.1 g/dL (ref 13.0–17.0)
Immature Granulocytes: 2 %
Lymphocytes Relative: 4 %
Lymphs Abs: 0.9 10*3/uL (ref 0.7–4.0)
MCH: 27.7 pg (ref 26.0–34.0)
MCHC: 30.8 g/dL (ref 30.0–36.0)
MCV: 90 fL (ref 80.0–100.0)
Monocytes Absolute: 0.6 10*3/uL (ref 0.1–1.0)
Monocytes Relative: 3 %
Neutro Abs: 19.3 10*3/uL — ABNORMAL HIGH (ref 1.7–7.7)
Neutrophils Relative %: 91 %
Platelets: 358 10*3/uL (ref 150–400)
RBC: 5.09 MIL/uL (ref 4.22–5.81)
RDW: 13.9 % (ref 11.5–15.5)
WBC: 21.3 10*3/uL — ABNORMAL HIGH (ref 4.0–10.5)
nRBC: 0.6 % — ABNORMAL HIGH (ref 0.0–0.2)

## 2019-11-07 LAB — COMPREHENSIVE METABOLIC PANEL
ALT: 62 U/L — ABNORMAL HIGH (ref 0–44)
AST: 37 U/L (ref 15–41)
Albumin: 3.4 g/dL — ABNORMAL LOW (ref 3.5–5.0)
Alkaline Phosphatase: 106 U/L (ref 38–126)
Anion gap: 16 — ABNORMAL HIGH (ref 5–15)
BUN: 49 mg/dL — ABNORMAL HIGH (ref 8–23)
CO2: 21 mmol/L — ABNORMAL LOW (ref 22–32)
Calcium: 8.3 mg/dL — ABNORMAL LOW (ref 8.9–10.3)
Chloride: 110 mmol/L (ref 98–111)
Creatinine, Ser: 1.39 mg/dL — ABNORMAL HIGH (ref 0.61–1.24)
GFR calc Af Amer: >60 mL/min (ref >60)
GFR calc non Af Amer: 52 mL/min — ABNORMAL LOW (ref >60)
Glucose, Bld: 97 mg/dL (ref 70–99)
Potassium: 3.9 mmol/L (ref 3.5–5.1)
Sodium: 147 mmol/L — ABNORMAL HIGH (ref 135–145)
Total Bilirubin: 1.6 mg/dL — ABNORMAL HIGH (ref 0.3–1.2)
Total Protein: 7.1 g/dL (ref 6.5–8.1)

## 2019-11-07 LAB — GLUCOSE, CAPILLARY
Glucose-Capillary: 194 mg/dL — ABNORMAL HIGH (ref 70–99)
Glucose-Capillary: 138 mg/dL — ABNORMAL HIGH (ref 70–99)
Glucose-Capillary: 138 mg/dL — ABNORMAL HIGH (ref 70–99)
Glucose-Capillary: 143 mg/dL — ABNORMAL HIGH (ref 70–99)
Glucose-Capillary: 124 mg/dL — ABNORMAL HIGH (ref 70–99)

## 2019-11-07 LAB — D-DIMER, QUANTITATIVE: D-Dimer, Quant: 5.56 ug/mL-FEU — ABNORMAL HIGH (ref 0.00–0.50)

## 2019-11-07 LAB — PROCALCITONIN: Procalcitonin: 0.11 ng/mL

## 2019-11-07 LAB — C-REACTIVE PROTEIN: CRP: <0.5 mg/dL (ref <1.0)

## 2019-11-07 LAB — BRAIN NATRIURETIC PEPTIDE: B Natriuretic Peptide: 49.2 pg/mL (ref 0.0–100.0)

## 2019-11-07 LAB — MAGNESIUM: Magnesium: 3.2 mg/dL — ABNORMAL HIGH (ref 1.7–2.4)

## 2019-11-07 MED ORDER — QUETIAPINE FUMARATE 25 MG PO TABS
25.0000 mg | ORAL_TABLET | Freq: Two times a day (BID) | ORAL | Status: DC
  Administered 2019-11-07 – 2019-11-09 (×6): 25 mg via ORAL
  Filled 2019-11-07 (×6): qty 1

## 2019-11-07 MED ORDER — SODIUM CHLORIDE 0.9 % IV SOLN
2.0000 g | Freq: Two times a day (BID) | INTRAVENOUS | Status: DC
  Administered 2019-11-07 – 2019-11-08 (×2): 2 g via INTRAVENOUS
  Filled 2019-11-07 (×2): qty 2

## 2019-11-07 MED ORDER — VANCOMYCIN HCL 2000 MG/400ML IV SOLN
2000.0000 mg | Freq: Once | INTRAVENOUS | Status: AC
  Administered 2019-11-07: 13:00:00 2000 mg via INTRAVENOUS
  Filled 2019-11-07: qty 400

## 2019-11-07 MED ORDER — VANCOMYCIN HCL 750 MG/150ML IV SOLN
750.0000 mg | Freq: Two times a day (BID) | INTRAVENOUS | Status: DC
  Administered 2019-11-08 – 2019-11-09 (×3): 750 mg via INTRAVENOUS
  Filled 2019-11-07 (×5): qty 150

## 2019-11-07 MED ORDER — DEXTROSE 5 % IV SOLN: INTRAVENOUS | Status: DC

## 2019-11-07 MED ORDER — HALOPERIDOL LACTATE 5 MG/ML IJ SOLN
1.0000 mg | Freq: Four times a day (QID) | INTRAMUSCULAR | Status: DC | PRN
  Filled 2019-11-07: qty 1

## 2019-11-07 NOTE — Progress Notes (Signed)
Family Update: I have received calls from three different family members of the patient and had given them all appropriate updates and answered all questions.

## 2019-11-07 NOTE — Evaluation (Signed)
Clinical/Bedside Swallow Evaluation Patient Details  Name: Levi Jones MRN: [id number] Date of Birth: [birth year]  Today's Date: 11/07/2019 Time: SLP Start Time (ACUTE ONLY): 1025 SLP Stop Time (ACUTE ONLY): 1035 SLP Time Calculation (min) (ACUTE ONLY): 10 min  Past Medical History:  Past Medical History:  Diagnosis Date  . Abnormal LFTs 07/01/2016  . ALLERGIC RHINITIS CAUSE UNSPECIFIED 03/05/2010   Qualifier: Diagnosis of  By: Stann Mainland CMA, [place]    . Anemia 07/02/2016  . Benign localized hyperplasia of prostate with urinary obstruction   . BPH (benign prostatic hyperplasia) 05/25/2018  . CAP (community acquired pneumonia) 07/01/2016  . 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 Hypertension   . Nocturia   . Osteoarthritis   . Sarcoidosis    h/o, DX in the 90's to early 2000s (Dr.Simonds)  . Sciatica 09/27/2014   Past Surgical History:  Past Surgical History:  Procedure Laterality Date  . FOOT SURGERY Left     remotely, at [facility], podiatry.  Marland Kitchen KNEE ARTHROSCOPY     Right x2  . KNEE ARTHROSCOPY     Left x50   HPI:  69 year old male with history of Sarcoidosis, HTN, HLD and BPH brought to ED by EMS due to worsening fatigue and shortness of breath for 2 to 3 days. Tested positive for COVID-19 on 12/25, he was admitted with severe hypoxic respiratory failure requiring nonrebreather mask and then 35 to 40 L of heated high flow oxygen.  He was treated with full extent of treatment including IV steroids, remdesivir, convalescent plasma and Actemra.  Transferred to [facility] for further care on 11/05/19.    Assessment / Plan / Recommendation Clinical Impression  Pt presents with significant concerns for an acute dysphagia relative to dyssynchrony between swallow/respiratory cycles and potential pharyngeal pressure changes with HHFNC (which  can impact swallow mechanics).  Currently he is on 40 L/m, Fi02 100%, tachypneic to the mid-40s - increasing oxygen demands and his WOB can create high potential for aspiration at this time.  Pt provided with ice chips, teaspoons water, meds whole in puree.  Confused, chewing pills rather than swallowing whole, with increased effort and RR as session progressed.  No overt coughing, but no further POs offered given the aforementioned concerns.  Recommend NPO for now excluding occasional ice chips and meds crushed in puree.  RN present, agrees.  SLP will follow closely for readiness to resume an oral diet.  SLP Visit Diagnosis: Dysphagia, unspecified (R13.10)    Aspiration Risk  Moderate aspiration risk    Diet Recommendation   NPO except crushed meds, ice chips  Medication Administration: Crushed with puree    Other  Recommendations Oral Care Recommendations: Oral care prior to ice chip/H20;Oral care QID   Follow up Recommendations Other (comment)(tba)      Frequency and Duration min 3x week  2 weeks       Prognosis Prognosis for Safe Diet Advancement: Fair      Swallow Study   General Date of Onset: 11/07/19 HPI: 69 year old male with history of Sarcoidosis, HTN, HLD and BPH brought to ED by EMS due to worsening fatigue and shortness of breath for 2 to 3 days. Tested positive for COVID-19 on 12/25, he was admitted with severe hypoxic respiratory failure requiring nonrebreather mask and then 35 to 40 L of heated high flow oxygen.  He  was treated with full extent of treatment including IV steroids, remdesivir, convalescent plasma and Actemra.  Transferred to [facility] for further care on 11/05/19.  Type of Study: Bedside Swallow Evaluation Previous Swallow Assessment: no Diet Prior to this Study: Regular;Thin liquids Temperature Spikes Noted: No Respiratory Status: Other (comment)(Heated HFNC ) History of Recent Intubation: No Behavior/Cognition: Alert;Confused Oral Cavity Assessment: Within  Functional Limits Oral Care Completed by SLP: No Oral Cavity - Dentition: Adequate natural dentition Self-Feeding Abilities: Needs assist Patient Positioning: Upright in bed Baseline Vocal Quality: Normal Volitional Cough: Strong Volitional Swallow: Able to elicit    Oral/Motor/Sensory Function Overall Oral Motor/Sensory Function: Within functional limits   Ice Chips Ice chips: Within functional limits   Thin Liquid Thin Liquid: Not tested    Nectar Thick Nectar Thick Liquid: Not tested   Honey Thick Honey Thick Liquid: Not tested   Puree Puree: Within functional limits   Solid     Solid: Not tested      Levi Jones 11/07/2019,11:15 AM  Estill Bamberg L. Tivis Ringer, [place] Office number [phone] Pager [phone]

## 2019-11-07 NOTE — Progress Notes (Addendum)
PROGRESS NOTE                                                                                                                                                                                                             Patient Demographics:    Levi Jones, is a 69 y.o. male, DOB - Jan 13, 1951, [id number]  Outpatient Primary MD for the patient is Colon Branch, MD    LOS - 6  Admit date - 11/05/2019    Chief Complaint  Patient presents with  . COVID 19  . Weakness       Brief Narrative  69 year old male with history of Sarcoidosis, HTN, HLD and BPH brought to ED by EMS due to worsening fatigue and shortness of breath for 2 to 3 days. Tested positive for COVID-19 on 12/25, he was admitted with severe hypoxic respiratory failure requiring nonrebreather mask and then 35 to 40 L of heated high flow oxygen.  He was treated with full extent of treatment including IV steroids, remdesivir, convalescent plasma and Actemra.  Transferred to [facility] for further care, transferred to my service on 11/05/2019.  He appears extremely tenuous with advanced acute parenchymal injury on top of his sarcoid lung.   Subjective:   Patient in bed, denies any headache, does have shortness of breath, no chest or abdominal pain, no productive cough, no focal weakness.   Assessment  & Plan :     1. Acute Hypoxic Resp. Failure due to Acute Covid 19 Viral Pneumonitis during the ongoing 2020 Covid 19 Pandemic - he has severe disease with underlying pulmonary sarcoidosis along with possible aspiration pneumonia and fluid overload, still has severe acute hypoxic respiratory failure and currently on 40 L of heated high flow oxygen at 100% FiO2 + NRB.   He likely has CHF + possible Asp.PNA - has been getting IV lasix as tolerated by his blood pressure and electrolytes + Unasyn (switched to vancomycin and cefepime on 11/07/2019)  + Steroids for now, overall extremely  guarded prognosis.  Discussed again with patient and patient's wife on 11/07/2019, plan is for now to monitor him closely, there is a good chance that he might require intubation and mechanical ventilation with full awareness that even with that his prognosis is extremely guarded.  His overall condition remains extremely guarded and tenuous.  Encouraged the patient  to sit up in chair in the daytime use I-S and flutter valve for pulmonary toiletry and then prone in bed when at night.      SpO2: 98 % O2 Flow Rate (L/min): 40 L/min FiO2 (%): 100 %  Recent Labs  Lab 11/02/2019 0844 11/02/19 0412 11/03/19 0207 11/04/19 0106 11/04/19 1745 11/05/19 0432 11/06/19 0210 11/07/19 0305  CRP 18.5* 19.7* 11.2* 7.0*  --  3.7* 2.1* <0.5  DDIMER 1.62* 1.90* 1.56* 1.32*  --  2.27* 6.38* 5.56*  FERRITIN 4,398* 2,701* 2,247* 1,705*  --   --   --   --   BNP  --   --   --   --  96.6 64.2 40.3 49.2  PROCALCITON 0.20  --   --   --  <0.10 <0.10 <0.10 0.11    Hepatic Function Latest Ref Rng & Units 11/07/2019 11/06/2019 11/05/2019  Total Protein 6.5 - 8.1 g/dL 7.1 6.7 7.0  Albumin 3.5 - 5.0 g/dL 3.4(L) 3.1(L) 3.1(L)  AST 15 - 41 U/L 37 34 46(H)  ALT 0 - 44 U/L 62(H) 74(H) 92(H)  Alk Phosphatase 38 - 126 U/L 106 120 138(H)  Total Bilirubin 0.3 - 1.2 mg/dL 1.6(H) 1.5(H) 1.4(H)  Bilirubin, Direct 0.00 - 0.40 mg/dL - - -   ABG    Component Value Date/Time   PHART 7.501 (H) 11/07/2019 0815   PCO2ART 28.2 (L) 11/07/2019 0815   PO2ART 42.0 (L) 11/07/2019 0815   HCO3 22.1 11/07/2019 0815   TCO2 23 11/07/2019 0815   O2SAT 84.0 11/07/2019 0815    2.  History of sarcoidosis with possible pulmonary involvement on CT scan.  Supportive care for now.  Outpatient pulmonary follow-up post discharge.  3.  Possible underlying liver cirrhosis noted on CT scan.  Supportive care outpatient GI follow-up.  4.  Essential hypertension.  Currently on Norvasc.  5.  Dyslipidemia.  Continue statin.  6.  BPH.  Has been  placed on Flomax.  7.  Leukocytosis with stable procalcitonin.  Repeat chest x-ray shows possible a new RLL focal infiltrate - see # 8.   8. Asp PNA - speech eval, Unasyn, procalcitonin is stable, afebrile, still has white count, will escalate antibiotics to vancomycin and cefepime and monitor, speech also following currently n.p.o. except medications, gentle IV fluid.  We will also check sputum Gram stain and culture.  If febrile then blood cultures.  9.  Mild hypernatremia.  Gentle D5W, Lasix as needed will have to be given for any fluid overload and to improve his pulmonary status and this might worsen his hypernatremia.    10. Steroid-induced hyperglycemia.  Currently on Lantus along with sliding scale, monitor and adjust.   Lab Results  Component Value Date   HGBA1C 5.3 09/28/2019    CBG (last 3)  Recent Labs    11/06/19 1648 11/06/19 2316 11/07/19 0745  GLUCAP 311* 135* 138*      Condition - Extremely Guarded  Family Communication  : Discussed with patient's wife in detail on 11/05/2019, explained guarded prognosis, also left for daughter Benjamine Mola on her listed cell phone on 11/05/2019 at 11:15 AM, 11/07/19 @ 11.45 am  Code Status : Full  Diet :    Diet Order            Diet NPO time specified Except for: Ice Chips  Diet effective now               Disposition Plan  : Stay in PCU  Consults  :  None  Procedures  :    CT - 1. Examination for pulmonary embolism is somewhat limited by breath motion artifact, particularly in the lower lobes. Within this limitation, no evidence of pulmonary embolism through the segmental pulmonary arterial level. 2. Extensive bilateral heterogeneous and ground-glass airspace opacity. Findings are consistent with multifocal infection, edema, and/or ARDS. 3. Fibrotic change of the perihilar upper lobes, combined with numerous calcified bilateral hilar and mediastinal lymph nodes, constellation of findings generally consistent with fibrotic  pulmonary sarcoidosis and similar to appearance on prior chest radiographs dated 07/27/2017. 4. Somewhat coarse appearing contour of the liver in the included upper abdomen, suggestive of cirrhosis.   PUD Prophylaxis :   DVT Prophylaxis  :  Lovenox   Lab Results  Component Value Date   PLT 358 11/07/2019    Inpatient Medications  Scheduled Meds: . albuterol  2 puff Inhalation Q6H  . vitamin C  500 mg Oral Daily  . aspirin EC  81 mg Oral Daily  . atorvastatin  20 mg Oral QHS  . Chlorhexidine Gluconate Cloth  6 each Topical Daily  . enoxaparin (LOVENOX) injection  40 mg Subcutaneous Q12H  . insulin aspart  0-20 Units Subcutaneous TID WC  . insulin aspart  0-5 Units Subcutaneous QHS  . insulin aspart  6 Units Subcutaneous TID WC  . insulin detemir  8 Units Subcutaneous QHS  . ipratropium  2 puff Inhalation Q6H  . linagliptin  5 mg Oral Daily  . loratadine  10 mg Oral Daily  . mouth rinse  15 mL Mouth Rinse BID  . methylPREDNISolone (SOLU-MEDROL) injection  60 mg Intravenous Daily  . QUEtiapine  25 mg Oral BID  . tamsulosin  0.4 mg Oral Daily  . zinc sulfate  220 mg Oral Daily   Continuous Infusions: . ampicillin-sulbactam (UNASYN) IV 3 g (11/07/19 1044)  . dextrose     PRN Meds:.acetaminophen, guaiFENesin-dextromethorphan, haloperidol lactate, LORazepam, polyethylene glycol, senna-docusate  Antibiotics  :    Anti-infectives (From admission, onward)   Start     Dose/Rate Route Frequency Ordered Stop   11/05/19 1600  Ampicillin-Sulbactam (UNASYN) 3 g in sodium chloride 0.9 % 100 mL IVPB     3 g 200 mL/hr over 30 Minutes Intravenous Every 6 hours 11/05/19 1454     11/02/19 1000  remdesivir 100 mg in sodium chloride 0.9 % 100 mL IVPB  Status:  Discontinued     100 mg 200 mL/hr over 30 Minutes Intravenous Daily 11/20/2019 1838 11/02/2019 1847   11/02/19 1000  remdesivir 100 mg in sodium chloride 0.9 % 100 mL IVPB     100 mg 200 mL/hr over 30 Minutes Intravenous Daily 11/22/2019  1054 11/05/19 1017   11/13/2019 1838  remdesivir 200 mg in sodium chloride 0.9% 250 mL IVPB  Status:  Discontinued     200 mg 580 mL/hr over 30 Minutes Intravenous Once 10/28/2019 1838 11/08/2019 1847   11/27/2019 1200  remdesivir 200 mg in sodium chloride 0.9% 250 mL IVPB     200 mg 580 mL/hr over 30 Minutes Intravenous Once 11/03/2019 1054 11/07/2019 1302       Time Spent in minutes  30   Lala Lund M.D on 11/07/2019 at 11:47 AM  To page go to [email or web address] - password [facility]  [facility] -  Office  [phone]    See all Orders from today for further details    Objective:   Vitals:   11/07/19 0900 11/07/19 1000 11/07/19 1100 11/07/19 1141  BP: 102/76 126/80 128/80 117/86  Pulse:    100  Resp: (!) 25 (!) 45 (!) 24 (!) 29  Temp:    97.9 F (36.6 C)  TempSrc:    Oral  SpO2: 92% 92%  98%  Weight:      Height:        Wt Readings from Last 3 Encounters:  11/02/19 115.3 kg  09/28/19 115.3 kg  06/01/19 114.1 kg     Intake/Output Summary (Last 24 hours) at 11/07/2019 1147 Last data filed at 11/07/2019 1100 Gross per 24 hour  Intake 1880 ml  Output 1625 ml  Net 255 ml     Physical Exam  Awake Alert but at times does get fidgety and confused intermittently, No new F.N deficits,   Stokes.AT,PERRAL Supple Neck,No JVD, No cervical lymphadenopathy appriciated.  Symmetrical Chest wall movement, Good air movement bilaterally, does have few bibasilar Rales RRR,No Gallops, Rubs or new Murmurs, No Parasternal Heave +ve B.Sounds, Abd Soft, No tenderness, No organomegaly appriciated, No rebound - guarding or rigidity. No Cyanosis, Clubbing or edema, No new Rash or bruise      Data Review:    CBC Recent Labs  Lab 11/03/19 0207 11/04/19 0106 11/05/19 0432 11/06/19 0210 11/07/19 0305 11/07/19 0815  WBC 13.6* 19.4* 20.9* 22.0* 21.3*  --   HGB 11.5* 12.2* 12.8* 12.9* 14.1 12.9*  HCT 37.7* 39.4 42.0 41.1 45.8 38.0*  PLT 266 367 363 382 358  --   MCV 90.0 89.7 91.5 88.6  90.0  --   MCH 27.4 27.8 27.9 27.8 27.7  --   MCHC 30.5 31.0 30.5 31.4 30.8  --   RDW 12.6 12.8 13.1 13.3 13.9  --   LYMPHSABS 0.7 1.0 0.8 0.7 0.9  --   MONOABS 0.3 0.8 0.8 0.5 0.6  --   EOSABS 0.0 0.0 0.0 0.0 0.0  --   BASOSABS 0.0 0.1 0.1 0.0 0.1  --     Chemistries  Recent Labs  Lab 11/03/19 0207 11/04/19 0106 11/05/19 0432 11/06/19 0210 11/07/19 0305 11/07/19 0815  NA 140 142 143 142 147* 146*  K 4.1 3.9 4.5 3.7 3.9 3.7  CL 104 107 106 106 110  --   CO2 '23 24 25 23 ' 21*  --   GLUCOSE 239* 256* 205* 184* 97  --   BUN 27* 30* 37* 44* 49*  --   CREATININE 1.11 1.25* 1.24 1.41* 1.39*  --   CALCIUM 8.3* 8.4* 8.3* 8.0* 8.3*  --   MG  --   --  2.8* 2.8* 3.2*  --   AST 94* 59* 46* 34 37  --   ALT 123* 114* 92* 74* 62*  --   ALKPHOS 88 111 138* 120 106  --   BILITOT 1.3* 0.9 1.4* 1.5* 1.6*  --    ------------------------------------------------------------------------------------------------------------------ No results for input(s): CHOL, HDL, LDLCALC, TRIG, CHOLHDL, LDLDIRECT in the last 72 hours.  Lab Results  Component Value Date   HGBA1C 5.3 09/28/2019   ------------------------------------------------------------------------------------------------------------------ No results for input(s): TSH, T4TOTAL, T3FREE, THYROIDAB in the last 72 hours.  Invalid input(s): FREET3  Cardiac Enzymes No results for input(s): CKMB, TROPONINI, MYOGLOBIN in the last 168 hours.  Invalid input(s): CK ------------------------------------------------------------------------------------------------------------------    Component Value Date/Time   BNP 49.2 11/07/2019 0305    Micro Results Recent Results (from the past 240 hour(s))  Blood Culture (routine x 2)     Status: None   Collection Time: 11/10/2019  8:44 AM  Specimen: BLOOD  Result Value Ref Range Status   Specimen Description   Final    BLOOD RIGHT ANTECUBITAL Performed at [facility] [address] [place], [place] [address]    Special Requests   Final    BOTTLES DRAWN AEROBIC AND ANAEROBIC Blood Culture adequate volume Performed at [facility] [address] [place], [place] [address]    Culture   Final    NO GROWTH 5 DAYS Performed at [facility], [place] [address], [place], [place] [address]    Report Status 11/06/2019 FINAL  Final  MRSA PCR Screening     Status: None   Collection Time: 11/03/19  3:31 AM   Specimen: Nasal Mucosa; Nasopharyngeal  Result Value Ref Range Status   MRSA by PCR NEGATIVE NEGATIVE Final    Comment:        The GeneXpert MRSA Assay (FDA approved for NASAL specimens only), is one component of a comprehensive MRSA colonization surveillance program. It is not intended to diagnose MRSA infection nor to guide or monitor treatment for MRSA infections. Performed at [facility], [place] [address] [place], [place] [address]     Radiology Reports CT ANGIO CHEST PE W OR WO CONTRAST  Result Date: 11/03/2019 CLINICAL DATA:  Shortness of breath, elevated D-dimer, hypoxia EXAM: CT ANGIOGRAPHY CHEST WITH CONTRAST TECHNIQUE: Multidetector CT imaging of the chest was performed using the standard protocol during bolus administration of intravenous contrast. Multiplanar CT image reconstructions and MIPs were obtained to evaluate the vascular anatomy. CONTRAST:  151m OMNIPAQUE IOHEXOL 350 MG/ML SOLN COMPARISON:  07/27/2017 FINDINGS: Cardiovascular: Examination for pulmonary embolism is somewhat limited by breath motion artifact, particularly in the lower lobes. Within this limitation, no evidence of pulmonary embolism through the segmental pulmonary arterial level. Normal heart size. No pericardial effusion. Mediastinum/Nodes: Numerous calcified bilateral hilar and mediastinal lymph nodes. Thyroid gland, trachea, and esophagus demonstrate no significant findings. Lungs/Pleura: Extensive bilateral heterogeneous and ground-glass  airspace opacity. Fibrotic change of the perihilar upper lobes (series 6, image 65). Upper Abdomen: No acute abnormality. Somewhat coarse appearing contour of the liver in the included upper abdomen. Musculoskeletal: No chest wall abnormality. No acute or significant osseous findings. Review of the MIP images confirms the above findings. IMPRESSION: 1. Examination for pulmonary embolism is somewhat limited by breath motion artifact, particularly in the lower lobes. Within this limitation, no evidence of pulmonary embolism through the segmental pulmonary arterial level. 2. Extensive bilateral heterogeneous and ground-glass airspace opacity. Findings are consistent with multifocal infection, edema, and/or ARDS. 3. Fibrotic change of the perihilar upper lobes, combined with numerous calcified bilateral hilar and mediastinal lymph nodes, constellation of findings generally consistent with fibrotic pulmonary sarcoidosis and similar to appearance on prior chest radiographs dated 07/27/2017. 4. Somewhat coarse appearing contour of the liver in the included upper abdomen, suggestive of cirrhosis. Electronically Signed   By: AEddie CandleM.D.   On: 11/03/2019 12:33   DG Chest Port 1 View  Result Date: 11/05/2019 CLINICAL DATA:  Shortness of breath EXAM: PORTABLE CHEST 1 VIEW COMPARISON:  November 01, 2019 FINDINGS: Evaluation is limited due to low lung volumes. Bilateral pulmonary infiltrates persist. The infiltrate in the right base is more prominent the interval. The infiltrate on the left may be a little improved in the interval. No change in the cardiomediastinal silhouette. No pneumothorax. IMPRESSION: 1. Evaluation is limited due to the low volume technique. 2. The right basilar opacity is more focal in  the interval. Left-sided infiltrate may be mildly improved in the interval. Recommend attention on follow-up. Electronically Signed   By: Dorise Bullion III M.D   On: 11/05/2019 09:42   DG Chest Port 1  View  Result Date: 11/24/2019 CLINICAL DATA:  Shortness of breath, worsening COVID symptoms, decreased O2 sats. EXAM: PORTABLE CHEST 1 VIEW COMPARISON:  Chest radiograph 04/30/2017 FINDINGS: Heart size at the upper limits of normal. Ill-defined bilateral airspace opacities with a mid to lower lung predominance. No sizable pleural effusion or evidence of pneumothorax. No acute bony abnormality. IMPRESSION: Bilateral ill-defined airspace opacities with a mid to lower lung predominance. Findings likely reflect atypical/viral pneumonia given provided history of COVID. Radiographic follow-up to resolution recommended. Electronically Signed   By: Kellie Simmering DO   On: 11/03/2019 08:33

## 2019-11-07 NOTE — Progress Notes (Addendum)
2145-2300: patient had multiple episodes of removing oxygen and increased confusion. Dr. Vanita Ingles to bedside, had a call with the wife at the bedside so patient can talk. Will start on seroquel BID, ativan given per [facility], wife updated. Wife asked if the patient was upset by staff or phone call. Informed that I have not seen any staff go in outside of myself, his phone is dead so he has not gotten a phone call. I reassured her that every time I check on him i'm reassuring him, calming touch, and replacing oxygen and discussing importance of it. All questions were answered. When I left room the patient was resting with eyes closed.   0500 - patient has taken oxygen off multiple times. This writer has not been able to leave the room for longer than 5 minutes without him taking oxygen off his face. Mitts placed to bilateral hands, and patient still manages to remove oxygen. Ativan given, see MAR, to help with increased agitation. Unable to reorient, easily confused. Will continue to remain at bedside.

## 2019-11-07 NOTE — Progress Notes (Signed)
Spoke with pt's daughter Benjamine Mola and answered all of her questions

## 2019-11-07 NOTE — Progress Notes (Signed)
Pharmacy Antibiotic Note  Levi Jones is a 69 y.o. male admitted on 11/09/2019 with COVID-19.  Pharmacy has been consulted for to broaden unasyn to vancomycin and cefepime. Pt is afebrile and WBC is elevated at 21.3. SCr is elevated at 1.39.   Plan: Vancomycin 2gm IV x 1 then 750mg  IV Q12H Cefepime 2gm IV Q12H F/u renal fxn, C&S, clinical status and peak/trough at SS   Height: 6' (182.9 cm) Weight: 254 lb 3.1 oz (115.3 kg) IBW/kg (Calculated) : 77.6  Temp (24hrs), Avg:98 F (36.7 C), Min:97.5 F (36.4 C), Max:98.6 F (37 C)  Recent Labs  Lab 11/26/2019 0844 11/27/2019 1130 11/03/19 0207 11/04/19 0106 11/05/19 0432 11/06/19 0210 11/07/19 0305  WBC 10.6*  --  13.6* 19.4* 20.9* 22.0* 21.3*  CREATININE 1.43*  --  1.11 1.25* 1.24 1.41* 1.39*  LATICACIDVEN 3.2* 2.2*  --   --   --   --   --     Estimated Creatinine Clearance: 67.6 mL/min (A) (by C-G formula based on SCr of 1.39 mg/dL (H)).    No Known Allergies   Thank you for involving pharmacy in this patient's care.  Salome Arnt, PharmD, BCPS Clinical Pharmacist Please see AMION for all pharmacy numbers 11/07/2019 12:14 PM

## 2019-11-08 ENCOUNTER — Inpatient Hospital Stay (HOSPITAL_COMMUNITY): Payer: Medicare Other

## 2019-11-08 LAB — CBC WITH DIFFERENTIAL/PLATELET
Abs Immature Granulocytes: 0.19 10*3/uL — ABNORMAL HIGH (ref 0.00–0.07)
Basophils Absolute: 0 10*3/uL (ref 0.0–0.1)
Basophils Relative: 0 %
Eosinophils Absolute: 0 10*3/uL (ref 0.0–0.5)
Eosinophils Relative: 0 %
HCT: 41.6 % (ref 39.0–52.0)
Hemoglobin: 12.8 g/dL — ABNORMAL LOW (ref 13.0–17.0)
Immature Granulocytes: 1 %
Lymphocytes Relative: 4 %
Lymphs Abs: 0.6 10*3/uL — ABNORMAL LOW (ref 0.7–4.0)
MCH: 27.9 pg (ref 26.0–34.0)
MCHC: 30.8 g/dL (ref 30.0–36.0)
MCV: 90.8 fL (ref 80.0–100.0)
Monocytes Absolute: 0.4 10*3/uL (ref 0.1–1.0)
Monocytes Relative: 3 %
Neutro Abs: 14.7 10*3/uL — ABNORMAL HIGH (ref 1.7–7.7)
Neutrophils Relative %: 92 %
Platelets: 280 10*3/uL (ref 150–400)
RBC: 4.58 MIL/uL (ref 4.22–5.81)
RDW: 14 % (ref 11.5–15.5)
WBC: 15.9 10*3/uL — ABNORMAL HIGH (ref 4.0–10.5)
nRBC: 0.3 % — ABNORMAL HIGH (ref 0.0–0.2)

## 2019-11-08 LAB — COMPREHENSIVE METABOLIC PANEL
ALT: 51 U/L — ABNORMAL HIGH (ref 0–44)
AST: 32 U/L (ref 15–41)
Albumin: 2.7 g/dL — ABNORMAL LOW (ref 3.5–5.0)
Alkaline Phosphatase: 89 U/L (ref 38–126)
Anion gap: 14 (ref 5–15)
BUN: 35 mg/dL — ABNORMAL HIGH (ref 8–23)
CO2: 19 mmol/L — ABNORMAL LOW (ref 22–32)
Calcium: 7.7 mg/dL — ABNORMAL LOW (ref 8.9–10.3)
Chloride: 111 mmol/L (ref 98–111)
Creatinine, Ser: 1.23 mg/dL (ref 0.61–1.24)
GFR calc Af Amer: >60 mL/min (ref >60)
GFR calc non Af Amer: >60 mL/min (ref >60)
Glucose, Bld: 111 mg/dL — ABNORMAL HIGH (ref 70–99)
Potassium: 4.5 mmol/L (ref 3.5–5.1)
Sodium: 144 mmol/L (ref 135–145)
Total Bilirubin: 1.3 mg/dL — ABNORMAL HIGH (ref 0.3–1.2)
Total Protein: 5.8 g/dL — ABNORMAL LOW (ref 6.5–8.1)

## 2019-11-08 LAB — BRAIN NATRIURETIC PEPTIDE: B Natriuretic Peptide: 33.6 pg/mL (ref 0.0–100.0)

## 2019-11-08 LAB — GLUCOSE, CAPILLARY
Glucose-Capillary: 118 mg/dL — ABNORMAL HIGH (ref 70–99)
Glucose-Capillary: 223 mg/dL — ABNORMAL HIGH (ref 70–99)
Glucose-Capillary: 208 mg/dL — ABNORMAL HIGH (ref 70–99)
Glucose-Capillary: 231 mg/dL — ABNORMAL HIGH (ref 70–99)
Glucose-Capillary: 139 mg/dL — ABNORMAL HIGH (ref 70–99)

## 2019-11-08 LAB — C-REACTIVE PROTEIN: CRP: 0.8 mg/dL

## 2019-11-08 LAB — MRSA PCR SCREENING: MRSA by PCR: NEGATIVE

## 2019-11-08 LAB — MAGNESIUM: Magnesium: 2.7 mg/dL — ABNORMAL HIGH (ref 1.7–2.4)

## 2019-11-08 LAB — D-DIMER, QUANTITATIVE: D-Dimer, Quant: 3.66 ug{FEU}/mL — ABNORMAL HIGH (ref 0.00–0.50)

## 2019-11-08 LAB — PROCALCITONIN: Procalcitonin: <0.1 ng/mL

## 2019-11-08 MED ORDER — DEXTROSE 5 % IV SOLN: INTRAVENOUS | Status: DC

## 2019-11-08 MED ORDER — SODIUM CHLORIDE 0.9 % IV SOLN
2.0000 g | Freq: Three times a day (TID) | INTRAVENOUS | Status: DC
  Administered 2019-11-08 – 2019-11-10 (×6): 2 g via INTRAVENOUS
  Filled 2019-11-08 (×6): qty 2

## 2019-11-08 MED ORDER — ORAL CARE MOUTH RINSE
15.0000 mL | Freq: Two times a day (BID) | OROMUCOSAL | Status: DC
  Administered 2019-11-08 – 2019-11-09 (×3): 15 mL via OROMUCOSAL

## 2019-11-08 MED ORDER — CHLORHEXIDINE GLUCONATE 0.12 % MT SOLN
15.0000 mL | Freq: Two times a day (BID) | OROMUCOSAL | Status: DC
  Administered 2019-11-08 – 2019-11-16 (×18): 15 mL via OROMUCOSAL
  Filled 2019-11-08 (×16): qty 15

## 2019-11-08 NOTE — Progress Notes (Addendum)
PROGRESS NOTE                                                                                                                                                                                                             Patient Demographics:    Levi Jones, is a 69 y.o. male, DOB - February 20, 1951, AJO:[id number]  Outpatient Primary MD for the patient is Colon Branch, MD    LOS - 7  Admit date - 11/16/2019    Chief Complaint  Patient presents with   COVID 19   Weakness       Brief Narrative  69 year old male with history of Sarcoidosis, HTN, HLD and BPH brought to ED by EMS due to worsening fatigue and shortness of breath for 2 to 3 days. Tested positive for COVID-19 on 12/25, he was admitted with severe hypoxic respiratory failure requiring nonrebreather mask and then 35 to 40 L of heated high flow oxygen.  He was treated with full extent of treatment including IV steroids, remdesivir, convalescent plasma and Actemra.  Transferred to [facility] for further care, transferred to my service on 11/05/2019.  He appears extremely tenuous with advanced acute parenchymal injury on top of his sarcoid lung.   Subjective:   Patient sitting up in recliner, appears tired and fatigued, in no visible distress but wearing high flow nasal cannula oxygen along with nonrebreather mask, denies any headache, no chest or abdominal pain, does have shortness of breath.   Assessment  & Plan :     1. Acute Hypoxic Resp. Failure due to Acute Covid 19 Viral Pneumonitis during the ongoing 2020 Covid 19 Pandemic - he has severe disease with underlying pulmonary sarcoidosis along with possible aspiration pneumonia and fluid overload, still has severe acute hypoxic respiratory failure and currently on 40 L of heated high flow oxygen at 100% FiO2 + NRB.   He likely has CHF + possible Asp.PNA - has been getting IV lasix as tolerated by his blood pressure and electrolytes  + Unasyn (switched to vancomycin and cefepime on 11/07/2019)  + Steroids for now, overall extremely guarded prognosis.  Discussed again with patient and patient's wife  on 11/07/2019, plan is for now to monitor him closely, there is a good chance that he might require intubation and mechanical ventilation with full awareness that even with that his prognosis is extremely guarded.  His overall condition remains extremely guarded and tenuous.  He will be moved electively to ICU for close monitoring as he is now getting more tired and I think he might need to get electively intubated soon.  Also might require a NG tube for feeding soon.  Again I have explained to the wife in detail over the phone multiple times including on 11/08/2019 that his condition is extremely guarded, if he gets intubated his chances of coming off of the ventilator alive or bleak.  She understands it.  Wants to continue full code for now.  Encouraged the patient to sit up in chair in the daytime use I-S and flutter valve for pulmonary toiletry and then prone in bed when at night.  SpO2: 93 % O2 Flow Rate (L/min): 40 L/min FiO2 (%): 100 %  Recent Labs  Lab 11/02/19 0412 11/03/19 0207 11/04/19 0106 11/04/19 1745 11/05/19 0432 11/06/19 0210 11/07/19 0305 11/08/19 0300  CRP 19.7* 11.2* 7.0*  --  3.7* 2.1* <0.5 0.8  DDIMER 1.90* 1.56* 1.32*  --  2.27* 6.38* 5.56* 3.66*  FERRITIN 2,701* 2,247* 1,705*  --   --   --   --   --   BNP  --   --   --  96.6 64.2 40.3 49.2 33.6  PROCALCITON  --   --   --  <0.10 <0.10 <0.10 0.11 <0.10    Hepatic Function Latest Ref Rng & Units 11/08/2019 11/07/2019 11/06/2019  Total Protein 6.5 - 8.1 g/dL 5.8(L) 7.1 6.7  Albumin 3.5 - 5.0 g/dL 2.7(L) 3.4(L) 3.1(L)  AST 15 - 41 U/L 32 37 34  ALT 0 - 44 U/L 51(H) 62(H) 74(H)  Alk Phosphatase 38 - 126 U/L 89 106 120  Total Bilirubin 0.3 - 1.2 mg/dL 1.3(H) 1.6(H) 1.5(H)  Bilirubin, Direct 0.00 - 0.40 mg/dL - - -   ABG    Component Value Date/Time    PHART 7.501 (H) 11/07/2019 0815   PCO2ART 28.2 (L) 11/07/2019 0815   PO2ART 42.0 (L) 11/07/2019 0815   HCO3 22.1 11/07/2019 0815   TCO2 23 11/07/2019 0815   O2SAT 84.0 11/07/2019 0815    2.  History of sarcoidosis with possible pulmonary involvement on CT scan.  Supportive care for now.  Outpatient pulmonary follow-up post discharge.  3.  Possible underlying liver cirrhosis noted on CT scan.  Supportive care outpatient GI follow-up.  4.  Essential hypertension.  Currently on Norvasc.  5.  Dyslipidemia.  Continue statin.  6.  BPH.  Has been placed on Flomax.  7.  Leukocytosis with stable procalcitonin.  Repeat chest x-ray shows possible a new RLL focal infiltrate - see # 8.   8. Asp PNA - speech eval, Unasyn, procalcitonin is stable, afebrile, still has white count, will escalate antibiotics to vancomycin and cefepime and monitor, speech also following currently n.p.o. except medications since 11/07/2019, gentle IV fluid.  We will also check sputum Gram stain and culture.  If febrile then blood cultures.  Currently gentle D5W but may require NG tube/core track soon if he is unable to eat or drink.  9.  Mild hypernatremia.  Improving with gentle D5W.    10. Steroid-induced hyperglycemia.  Currently on Lantus along with sliding scale, monitor and adjust.   Lab Results  Component Value  Date   HGBA1C 5.3 09/28/2019    CBG (last 3)  Recent Labs    11/07/19 1601 11/07/19 2237 11/08/19 0740  GLUCAP 143* 124* 118*      Condition - Extremely Guarded  Family Communication  : Discussed with patient's wife in detail on 11/05/2019, explained guarded prognosis, also left for daughter Benjamine Mola on her listed cell phone on 11/05/2019 at 11:15 AM, 11/07/19 @ 11.45 am, 11/08/19 @ 11.35  Code Status : Full  Diet :    Diet Order            Diet NPO time specified Except for: Ice Chips  Diet effective now               Disposition Plan  : Stay in PCU  Consults  : None  Procedures   :    CT - 1. Examination for pulmonary embolism is somewhat limited by breath motion artifact, particularly in the lower lobes. Within this limitation, no evidence of pulmonary embolism through the segmental pulmonary arterial level. 2. Extensive bilateral heterogeneous and ground-glass airspace opacity. Findings are consistent with multifocal infection, edema, and/or ARDS. 3. Fibrotic change of the perihilar upper lobes, combined with numerous calcified bilateral hilar and mediastinal lymph nodes, constellation of findings generally consistent with fibrotic pulmonary sarcoidosis and similar to appearance on prior chest radiographs dated 07/27/2017. 4. Somewhat coarse appearing contour of the liver in the included upper abdomen, suggestive of cirrhosis.   PUD Prophylaxis :   DVT Prophylaxis  :  Lovenox   Lab Results  Component Value Date   PLT 280 11/08/2019    Inpatient Medications  Scheduled Meds:  albuterol  2 puff Inhalation Q6H   vitamin C  500 mg Oral Daily   aspirin EC  81 mg Oral Daily   atorvastatin  20 mg Oral QHS   Chlorhexidine Gluconate Cloth  6 each Topical Daily   enoxaparin (LOVENOX) injection  40 mg Subcutaneous Q12H   insulin aspart  0-20 Units Subcutaneous TID WC   insulin aspart  0-5 Units Subcutaneous QHS   insulin aspart  6 Units Subcutaneous TID WC   insulin detemir  8 Units Subcutaneous QHS   ipratropium  2 puff Inhalation Q6H   linagliptin  5 mg Oral Daily   loratadine  10 mg Oral Daily   mouth rinse  15 mL Mouth Rinse BID   methylPREDNISolone (SOLU-MEDROL) injection  60 mg Intravenous Daily   QUEtiapine  25 mg Oral BID   tamsulosin  0.4 mg Oral Daily   zinc sulfate  220 mg Oral Daily   Continuous Infusions:  ceFEPime (MAXIPIME) IV     dextrose 50 mL/hr at 11/08/19 0610   vancomycin 150 mL/hr at 11/08/19 0600   PRN Meds:.acetaminophen, guaiFENesin-dextromethorphan, haloperidol lactate, LORazepam, polyethylene glycol,  senna-docusate  Antibiotics  :    Anti-infectives (From admission, onward)   Start     Dose/Rate Route Frequency Ordered Stop   11/08/19 1400  ceFEPIme (MAXIPIME) 2 g in sodium chloride 0.9 % 100 mL IVPB     2 g 200 mL/hr over 30 Minutes Intravenous Every 8 hours 11/08/19 0821     11/08/19 0500  vancomycin (VANCOREADY) IVPB 750 mg/150 mL     750 mg 150 mL/hr over 60 Minutes Intravenous Every 12 hours 11/07/19 1212     11/07/19 1300  ceFEPIme (MAXIPIME) 2 g in sodium chloride 0.9 % 100 mL IVPB  Status:  Discontinued     2 g 200 mL/hr over  30 Minutes Intravenous Every 12 hours 11/07/19 1210 11/08/19 0821   11/07/19 1300  vancomycin (VANCOREADY) IVPB 2000 mg/400 mL     2,000 mg 200 mL/hr over 120 Minutes Intravenous  Once 11/07/19 1210 11/07/19 1600   11/05/19 1600  Ampicillin-Sulbactam (UNASYN) 3 g in sodium chloride 0.9 % 100 mL IVPB  Status:  Discontinued     3 g 200 mL/hr over 30 Minutes Intravenous Every 6 hours 11/05/19 1454 11/07/19 1210   11/02/19 1000  remdesivir 100 mg in sodium chloride 0.9 % 100 mL IVPB  Status:  Discontinued     100 mg 200 mL/hr over 30 Minutes Intravenous Daily 10/31/2019 1838 11/06/2019 1847   11/02/19 1000  remdesivir 100 mg in sodium chloride 0.9 % 100 mL IVPB     100 mg 200 mL/hr over 30 Minutes Intravenous Daily 11/06/2019 1054 11/05/19 1017   11/07/2019 1838  remdesivir 200 mg in sodium chloride 0.9% 250 mL IVPB  Status:  Discontinued     200 mg 580 mL/hr over 30 Minutes Intravenous Once 11/23/2019 1838 11/10/2019 1847   11/23/2019 1200  remdesivir 200 mg in sodium chloride 0.9% 250 mL IVPB     200 mg 580 mL/hr over 30 Minutes Intravenous Once 11/10/2019 1054 11/12/2019 1302       Time Spent in minutes  30   Lala Lund M.D on 11/08/2019 at 11:33 AM  To page go to [email or web address] - password [facility]  [facility] -  Office  [phone]    See all Orders from today for further details    Objective:   Vitals:   11/08/19 0400 11/08/19 0500 11/08/19  0600 11/08/19 0741  BP: 117/75 111/80  110/68  Pulse:    88  Resp: (!) 25 (!) 30 (!) 32 20  Temp:    98.2 F (36.8 C)  TempSrc:    Oral  SpO2: 93% 93% 94% 93%  Weight:      Height:        Wt Readings from Last 3 Encounters:  11/02/19 115.3 kg  09/28/19 115.3 kg  06/01/19 114.1 kg     Intake/Output Summary (Last 24 hours) at 11/08/2019 1133 Last data filed at 11/08/2019 0600 Gross per 24 hour  Intake 1571.66 ml  Output 1550 ml  Net 21.66 ml     Physical Exam  Awake but overall getting more tired and fatigued, able to answer basic questions and follow basic commands, currently no focal deficits,  [place].AT,PERRAL Supple Neck,No JVD, No cervical lymphadenopathy appriciated.  Symmetrical Chest wall movement, Good air movement bilaterally, CTAB RRR,No Gallops, Rubs or new Murmurs, No Parasternal Heave +ve B.Sounds, Abd Soft, No tenderness, No organomegaly appriciated, No rebound - guarding or rigidity. No Cyanosis, Clubbing or edema, No new Rash or bruise     Data Review:    CBC Recent Labs  Lab 11/04/19 0106 11/05/19 0432 11/06/19 0210 11/07/19 0305 11/07/19 0815 11/08/19 0300  WBC 19.4* 20.9* 22.0* 21.3*  --  15.9*  HGB 12.2* 12.8* 12.9* 14.1 12.9* 12.8*  HCT 39.4 42.0 41.1 45.8 38.0* 41.6  PLT 367 363 382 358  --  280  MCV 89.7 91.5 88.6 90.0  --  90.8  MCH 27.8 27.9 27.8 27.7  --  27.9  MCHC 31.0 30.5 31.4 30.8  --  30.8  RDW 12.8 13.1 13.3 13.9  --  14.0  LYMPHSABS 1.0 0.8 0.7 0.9  --  0.6*  MONOABS 0.8 0.8 0.5 0.6  --  0.4  EOSABS 0.0  0.0 0.0 0.0  --  0.0  BASOSABS 0.1 0.1 0.0 0.1  --  0.0    Chemistries  Recent Labs  Lab 11/04/19 0106 11/05/19 0432 11/06/19 0210 11/07/19 0305 11/07/19 0815 11/08/19 0300  NA 142 143 142 147* 146* 144  K 3.9 4.5 3.7 3.9 3.7 4.5  CL 107 106 106 110  --  111  CO2 _0 21*  --  19*  GLUCOSE 256* 205* 184* 97  --  111*  BUN 30* 37* 44* 49*  --  35*  CREATININE 1.25* 1.24 1.41* 1.39*  --  1.23  CALCIUM 8.4*  8.3* 8.0* 8.3*  --  7.7*  MG  --  2.8* 2.8* 3.2*  --  2.7*  AST 59* 46* 34 37  --  32  ALT 114* 92* 74* 62*  --  51*  ALKPHOS 111 138* 120 106  --  89  BILITOT 0.9 1.4* 1.5* 1.6*  --  1.3*   ------------------------------------------------------------------------------------------------------------------ No results for input(s): CHOL, HDL, LDLCALC, TRIG, CHOLHDL, LDLDIRECT in the last 72 hours.  Lab Results  Component Value Date   HGBA1C 5.3 09/28/2019   ------------------------------------------------------------------------------------------------------------------ No results for input(s): TSH, T4TOTAL, T3FREE, THYROIDAB in the last 72 hours.  Invalid input(s): FREET3  Cardiac Enzymes No results for input(s): CKMB, TROPONINI, MYOGLOBIN in the last 168 hours.  Invalid input(s): CK ------------------------------------------------------------------------------------------------------------------    Component Value Date/Time   BNP 33.6 11/08/2019 0300    Micro Results Recent Results (from the past 240 hour(s))  Blood Culture (routine x 2)     Status: None   Collection Time: 10/29/2019  8:44 AM   Specimen: BLOOD  Result Value Ref Range Status   Specimen Description   Final    BLOOD RIGHT ANTECUBITAL Performed at [place] [facility] [place] [address], [place], [place] [address]    Special Requests   Final    BOTTLES DRAWN AEROBIC AND ANAEROBIC Blood Culture adequate volume Performed at [place] [address], [place], [place] [address]    Culture   Final    NO GROWTH 5 DAYS Performed at [facility], [place] [address], [place], [place] [address]    Report Status 11/06/2019 FINAL  Final  MRSA PCR Screening     Status: None   Collection Time: 11/03/19  3:31 AM   Specimen: Nasal Mucosa; Nasopharyngeal  Result Value Ref Range Status   MRSA by PCR NEGATIVE NEGATIVE Final    Comment:        The GeneXpert MRSA Assay (FDA approved for  NASAL specimens only), is one component of a comprehensive MRSA colonization surveillance program. It is not intended to diagnose MRSA infection nor to guide or monitor treatment for MRSA infections. Performed at [facility], [place] [address], [place], [place] [address]     Radiology Reports CT ANGIO CHEST PE W OR WO CONTRAST  Result Date: 11/03/2019 CLINICAL DATA:  Shortness of breath, elevated D-dimer, hypoxia EXAM: CT ANGIOGRAPHY CHEST WITH CONTRAST TECHNIQUE: Multidetector CT imaging of the chest was performed using the standard protocol during bolus administration of intravenous contrast. Multiplanar CT image reconstructions and MIPs were obtained to evaluate the vascular anatomy. CONTRAST:  150m OMNIPAQUE IOHEXOL 350 MG/ML SOLN COMPARISON:  07/27/2017 FINDINGS: Cardiovascular: Examination for pulmonary embolism is somewhat limited by breath motion artifact, particularly in the lower lobes. Within this limitation, no evidence of pulmonary embolism through the segmental pulmonary arterial level. Normal heart size. No pericardial effusion.  Mediastinum/Nodes: Numerous calcified bilateral hilar and mediastinal lymph nodes. Thyroid gland, trachea, and esophagus demonstrate no significant findings. Lungs/Pleura: Extensive bilateral heterogeneous and ground-glass airspace opacity. Fibrotic change of the perihilar upper lobes (series 6, image 65). Upper Abdomen: No acute abnormality. Somewhat coarse appearing contour of the liver in the included upper abdomen. Musculoskeletal: No chest wall abnormality. No acute or significant osseous findings. Review of the MIP images confirms the above findings. IMPRESSION: 1. Examination for pulmonary embolism is somewhat limited by breath motion artifact, particularly in the lower lobes. Within this limitation, no evidence of pulmonary embolism through the segmental pulmonary arterial level. 2. Extensive bilateral heterogeneous and ground-glass  airspace opacity. Findings are consistent with multifocal infection, edema, and/or ARDS. 3. Fibrotic change of the perihilar upper lobes, combined with numerous calcified bilateral hilar and mediastinal lymph nodes, constellation of findings generally consistent with fibrotic pulmonary sarcoidosis and similar to appearance on prior chest radiographs dated 07/27/2017. 4. Somewhat coarse appearing contour of the liver in the included upper abdomen, suggestive of cirrhosis. Electronically Signed   By: Eddie Candle M.D.   On: 11/03/2019 12:33   CXR am  Result Date: 11/08/2019 CLINICAL DATA:  Shortness of breath EXAM: PORTABLE CHEST 1 VIEW COMPARISON:  11/05/2019 FINDINGS: Bilateral opacities with a basilar predominance again identified. Aeration is slightly worse compared to the prior study. No pneumothorax. Cardiomediastinal contours are within normal limits. IMPRESSION: Persistent bilateral opacities with slight worsening of lung aeration compared to 11/05/2019. Electronically Signed   By: Macy Mis M.D.   On: 11/08/2019 08:47   DG Chest Port 1 View  Result Date: 11/05/2019 CLINICAL DATA:  Shortness of breath EXAM: PORTABLE CHEST 1 VIEW COMPARISON:  November 01, 2019 FINDINGS: Evaluation is limited due to low lung volumes. Bilateral pulmonary infiltrates persist. The infiltrate in the right base is more prominent the interval. The infiltrate on the left may be a little improved in the interval. No change in the cardiomediastinal silhouette. No pneumothorax. IMPRESSION: 1. Evaluation is limited due to the low volume technique. 2. The right basilar opacity is more focal in the interval. Left-sided infiltrate may be mildly improved in the interval. Recommend attention on follow-up. Electronically Signed   By: Dorise Bullion III M.D   On: 11/05/2019 09:42   DG Chest Port 1 View  Result Date: 10/31/2019 CLINICAL DATA:  Shortness of breath, worsening COVID symptoms, decreased O2 sats. EXAM: PORTABLE CHEST 1  VIEW COMPARISON:  Chest radiograph 04/30/2017 FINDINGS: Heart size at the upper limits of normal. Ill-defined bilateral airspace opacities with a mid to lower lung predominance. No sizable pleural effusion or evidence of pneumothorax. No acute bony abnormality. IMPRESSION: Bilateral ill-defined airspace opacities with a mid to lower lung predominance. Findings likely reflect atypical/viral pneumonia given provided history of COVID. Radiographic follow-up to resolution recommended. Electronically Signed   By: Kellie Simmering DO   On: 11/27/2019 08:33

## 2019-11-08 NOTE — Progress Notes (Signed)
  Speech Language Pathology Treatment: Dysphagia  Patient Details Name: Levi Jones MRN: [id number] DOB: 22-Oct-1951 Today's Date: 11/08/2019 Time: [id number] SLP Time Calculation (min) (ACUTE ONLY): 12 min  Assessment / Plan / Recommendation Clinical Impression  Pt continues with concerns for swallowing safety given tenuous respiratory status with increasing oxygen needs today.  Pt on NRB and heated HFNC, 40L/m, Fi02 100%. Tachypnea still present.  Sp02 87%.  He wants to eat; explained concerns for aspiration in basic terms. Ice chips provide some satisfaction (and appear to be tolerated 1-2 at a time), but advanced POs were not provided and three oz water test NOT administered given concerns for airway protection and his work to breathe. Would not recommend starting a PO diet at this time; consider cortrak.   SLP will follow along for readiness.     HPI HPI: 69 year old male with history of Sarcoidosis, HTN, HLD and BPH brought to ED by EMS due to worsening fatigue and shortness of breath for 2 to 3 days. Tested positive for COVID-19 on 12/25, he was admitted with severe hypoxic respiratory failure requiring nonrebreather mask and then 35 to 40 L of heated high flow oxygen.  He was treated with full extent of treatment including IV steroids, remdesivir, convalescent plasma and Actemra.  Transferred to [facility] for further care on 11/05/19.       SLP Plan  Continue with current plan of care       Recommendations  Diet recommendations: NPO Medication Administration: Crushed with puree                Oral Care Recommendations: Oral care prior to ice chip/H20;Oral care QID Follow up Recommendations: Other (comment)(tba) SLP Visit Diagnosis: Dysphagia, unspecified (R13.10) Plan: Continue with current plan of care       [place]. Levi Ringer, MA CCC/SLP Acute Rehabilitation Services Office number [phone]    Levi Jones 11/08/2019, 9:12 AM

## 2019-11-08 NOTE — Progress Notes (Signed)
ANTICOAGULATION CONSULT NOTE - Initial Consult  Pharmacy Consult for enoxaparin Indication: VTE prophylaxis  No Known Allergies  Patient Measurements: Height: 6' (182.9 cm) Weight: 254 lb 3.1 oz (115.3 kg) IBW/kg (Calculated) : 77.6  Vital Signs: Temp: 98.2 F (36.8 C) (01/12 0741) Temp Source: Oral (01/12 0741) BP: 110/68 (01/12 0741) Pulse Rate: 88 (01/12 0741)  Labs: Recent Labs    11/06/19 0210 11/07/19 0305 11/07/19 0815 11/08/19 0300  HGB 12.9* 14.1 12.9* 12.8*  HCT 41.1 45.8 38.0* 41.6  PLT 382 358  --  280  CREATININE 1.41* 1.39*  --  1.23    Estimated Creatinine Clearance: 76.4 mL/min (by C-G formula based on SCr of 1.23 mg/dL).   Medical History: Past Medical History:  Diagnosis Date  . Abnormal LFTs 07/01/2016  . ALLERGIC RHINITIS CAUSE UNSPECIFIED 03/05/2010   Qualifier: Diagnosis of  By: Stann Mainland CMA, [place]    . Anemia 07/02/2016  . Benign localized hyperplasia of prostate with urinary obstruction   . BPH (benign prostatic hyperplasia) 05/25/2018  . CAP (community acquired pneumonia) 07/01/2016  . 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 Hypertension   . Nocturia   . Osteoarthritis   . Sarcoidosis    h/o, DX in the 90's to early 2000s (Dr.Simonds)  . Sciatica 09/27/2014   Assessment: 69 yo M currently on Lovenox BID for high prophylactic dose per MD. Ddimer down to 3.66. BMI borderline at 34. Hgb stable at 12.8, plts wnl.  Goal of Therapy:  Monitor platelets by anticoagulation protocol: Yes   Plan:  Continue enoxaparin 40mg  [place] Q12h  Monitor CBC, s/s of bleed  [name] J 11/08/2019,8:08 AM

## 2019-11-08 NOTE — Plan of Care (Signed)
  Problem: Education: Goal: Knowledge of General Education information will improve Description: Including pain rating scale, medication(s)/side effects and non-pharmacologic comfort measures Outcome: Progressing   Problem: Clinical Measurements: Goal: Will remain free from infection Outcome: Progressing Goal: Respiratory complications will improve Outcome: Progressing Goal: Cardiovascular complication will be avoided Outcome: Progressing   Problem: Activity: Goal: Risk for activity intolerance will decrease Outcome: Progressing   

## 2019-11-08 NOTE — Progress Notes (Signed)
Spoke with pt's wife, [name], and provided updates; all questions answered. Later in the morning, the pt's daughter, Benjamine Mola, also called. I explained the family communication policy to her and asked that she get her updates from [place]. Pt's daughter was already aware of the policy, and explained that she is not in communication with pt's wife.  After confirming with AC, AD, and charge, I explained to the pt's daughter that she will be provided with an update if she calls but that pt's wife will be the first to receive phone calls. Pt's daughter expressed understanding.

## 2019-11-08 NOTE — Progress Notes (Signed)
Patient arrive to ICU from PCU. Oxygen demands increasing. Patient placed on heated high flow and tolerating well.

## 2019-11-08 NOTE — Progress Notes (Signed)
PT Cancellation Note  Patient Details Name: RUBEN ALTIZER MRN: [id number] DOB: 08-Feb-1951   Cancelled Treatment:    Reason Eval/Treat Not Completed: Medical issues which prohibited therapy(He is being transfered to ICU, currently medically unstable.)  Rollen Sox, PT # [phone] CGV cell  Casandra Doffing 11/08/2019, 1:14 PM

## 2019-11-08 NOTE — Progress Notes (Signed)
Pharmacy Antibiotic Note  Levi Jones is a 69 y.o. male admitted on 11/24/2019 with COVID-19.  Pharmacy has been consulted for to broaden unasyn to vancomycin and cefepime.  Day #2 of broadened abx for PNA. Day #4 of total abx. S/p remdesivir and Actemra. MRSA PCR was negative on 1/7 but vanc was initiated > 48 hrs after PCR test. Afebrile, WBC down to 15.9. SCr improved to 1.23  Plan: Increase cefepime to 2g IV Q8h Continue vancomycin 750mg  IV Q12h (AUC 506, SCr 1.39, Vd 0.5) Monitor clinical picture, renal function, vanc levels prn F/U sputum cx, abx deescalation / LOT  Height: 6' (182.9 cm) Weight: 254 lb 3.1 oz (115.3 kg) IBW/kg (Calculated) : 77.6  Temp (24hrs), Avg:98 F (36.7 C), Min:97.9 F (36.6 C), Max:98.2 F (36.8 C)  Recent Labs  Lab 11/03/2019 0844 11/19/2019 1130 11/04/19 0106 11/05/19 0432 11/06/19 0210 11/07/19 0305 11/08/19 0300  WBC 10.6*  --  19.4* 20.9* 22.0* 21.3* 15.9*  CREATININE 1.43*  --  1.25* 1.24 1.41* 1.39* 1.23  LATICACIDVEN 3.2* 2.2*  --   --   --   --   --     Estimated Creatinine Clearance: 76.4 mL/min (by C-G formula based on SCr of 1.23 mg/dL).    No Known Allergies  Thank you for involving pharmacy in this patient's care.  Elenor Quinones, PharmD, BCPS, BCIDP Clinical Pharmacist 11/08/2019 8:18 AM

## 2019-11-09 DIAGNOSIS — E43 Unspecified severe protein-calorie malnutrition: Secondary | ICD-10-CM | POA: Insufficient documentation

## 2019-11-09 LAB — GLUCOSE, CAPILLARY
Glucose-Capillary: 100 mg/dL — ABNORMAL HIGH (ref 70–99)
Glucose-Capillary: 123 mg/dL — ABNORMAL HIGH (ref 70–99)
Glucose-Capillary: 213 mg/dL — ABNORMAL HIGH (ref 70–99)
Glucose-Capillary: 188 mg/dL — ABNORMAL HIGH (ref 70–99)

## 2019-11-09 MED ORDER — LINAGLIPTIN 5 MG PO TABS
5.0000 mg | ORAL_TABLET | Freq: Every day | ORAL | Status: DC
  Administered 2019-11-10 – 2019-11-12 (×3): 5 mg
  Filled 2019-11-09 (×3): qty 1

## 2019-11-09 MED ORDER — INSULIN ASPART 100 UNIT/ML ~~LOC~~ SOLN: 6.0000 [IU] | SUBCUTANEOUS | Status: DC

## 2019-11-09 MED ORDER — INSULIN DETEMIR 100 UNIT/ML ~~LOC~~ SOLN
10.0000 [IU] | Freq: Every day | SUBCUTANEOUS | Status: DC
  Administered 2019-11-09 – 2019-11-16 (×8): 10 [IU] via SUBCUTANEOUS
  Filled 2019-11-09 (×10): qty 0.1

## 2019-11-09 MED ORDER — INSULIN ASPART 100 UNIT/ML ~~LOC~~ SOLN
0.0000 [IU] | SUBCUTANEOUS | Status: DC
  Administered 2019-11-09: 4 [IU] via SUBCUTANEOUS
  Administered 2019-11-09: 7 [IU] via SUBCUTANEOUS
  Administered 2019-11-10: 3 [IU] via SUBCUTANEOUS
  Administered 2019-11-10: 17:00:00 7 [IU] via SUBCUTANEOUS
  Administered 2019-11-10: 13:00:00 3 [IU] via SUBCUTANEOUS

## 2019-11-09 MED ORDER — LORATADINE 10 MG PO TABS
10.0000 mg | ORAL_TABLET | Freq: Every day | ORAL | Status: DC
  Administered 2019-11-10 – 2019-11-12 (×3): 10 mg
  Filled 2019-11-09 (×3): qty 1

## 2019-11-09 MED ORDER — POLYETHYLENE GLYCOL 3350 17 G PO PACK: 17.0000 g | PACK | Freq: Every day | ORAL | Status: DC | PRN

## 2019-11-09 MED ORDER — PRO-STAT SUGAR FREE PO LIQD
30.0000 mL | Freq: Two times a day (BID) | ORAL | Status: DC
  Administered 2019-11-09 (×2): 30 mL
  Filled 2019-11-09 (×3): qty 30

## 2019-11-09 MED ORDER — LORAZEPAM 0.5 MG PO TABS
0.5000 mg | ORAL_TABLET | Freq: Four times a day (QID) | ORAL | Status: DC | PRN
  Administered 2019-11-10 – 2019-11-11 (×2): 0.5 mg
  Filled 2019-11-09 (×2): qty 1

## 2019-11-09 MED ORDER — OSMOLITE 1.2 CAL PO LIQD: 1000.0000 mL | ORAL | Status: DC

## 2019-11-09 MED ORDER — QUETIAPINE FUMARATE 25 MG PO TABS
25.0000 mg | ORAL_TABLET | Freq: Two times a day (BID) | ORAL | Status: DC
  Administered 2019-11-09 – 2019-11-11 (×4): 25 mg
  Filled 2019-11-09 (×4): qty 1

## 2019-11-09 MED ORDER — ENOXAPARIN SODIUM 40 MG/0.4ML ~~LOC~~ SOLN
40.0000 mg | SUBCUTANEOUS | Status: DC
  Administered 2019-11-10 – 2019-11-11 (×2): 40 mg via SUBCUTANEOUS
  Filled 2019-11-09 (×2): qty 0.4

## 2019-11-09 MED ORDER — INSULIN ASPART 100 UNIT/ML ~~LOC~~ SOLN
4.0000 [IU] | SUBCUTANEOUS | Status: DC
  Administered 2019-11-09 – 2019-11-10 (×3): 4 [IU] via SUBCUTANEOUS

## 2019-11-09 MED ORDER — SENNOSIDES-DOCUSATE SODIUM 8.6-50 MG PO TABS: 1.0000 | ORAL_TABLET | Freq: Two times a day (BID) | ORAL | Status: DC | PRN

## 2019-11-09 MED ORDER — IPRATROPIUM BROMIDE HFA 17 MCG/ACT IN AERS
2.0000 | INHALATION_SPRAY | Freq: Four times a day (QID) | RESPIRATORY_TRACT | Status: DC | PRN
  Administered 2019-11-11: 2 via RESPIRATORY_TRACT
  Filled 2019-11-09: qty 12.9

## 2019-11-09 MED ORDER — OSMOLITE 1.5 CAL PO LIQD
1000.0000 mL | ORAL | Status: DC
  Administered 2019-11-09: 1000 mL
  Filled 2019-11-09 (×3): qty 1000

## 2019-11-09 MED ORDER — ALBUTEROL SULFATE HFA 108 (90 BASE) MCG/ACT IN AERS
2.0000 | INHALATION_SPRAY | Freq: Four times a day (QID) | RESPIRATORY_TRACT | Status: DC | PRN
  Administered 2019-11-11 – 2019-11-12 (×2): 2 via RESPIRATORY_TRACT
  Filled 2019-11-09: qty 6.7

## 2019-11-09 MED ORDER — ASPIRIN 81 MG PO CHEW
81.0000 mg | CHEWABLE_TABLET | Freq: Every day | ORAL | Status: DC
  Administered 2019-11-10 – 2019-11-11 (×2): 81 mg
  Filled 2019-11-09 (×2): qty 1

## 2019-11-09 NOTE — Progress Notes (Addendum)
Initial Nutrition Assessment  DOCUMENTATION CODES:   Severe malnutrition in context of acute illness/injury  INTERVENTION:   Begin TF via Cortrak:   Osmolite 1.5 at 20 ml/h, increase by 10 ml every 4 hours to goal rate of 70 ml/h (1680 ml per day).   Pro-stat 30 ml BID.   Provides 2720 kcal, 135 gm protein, 1280 ml free water daily.   Recommend monitor phosphorus, potassium, and magnesium and replete as necessary, patient is at risk for refeeding syndrome, given poor intake for greater than the past week and severe malnutrition.    When IVF discontinued, add free water flushes 250 ml every 4 hours.   NUTRITION DIAGNOSIS:   Severe Malnutrition related to acute illness(COVID-19) as evidenced by energy intake < or equal to 50% for > or equal to 5 days, moderate muscle depletion.  GOAL:   Patient will meet greater than or equal to 90% of their needs  MONITOR:   TF tolerance, Skin, Labs, I & O's, Weight trends  REASON FOR ASSESSMENT:   Other (Comment)(Cortrak placed)    ASSESSMENT:   69 yo male admitted with worsening fatigue and SOB x 2-3 days. Tested positive for COVID-19 on 12/25. PMH includes HTN, sarcoidosis, anemia.   Patient was transferred to the ICU 1/12 on 40 L. Remains on HFNC, 40 L oxygen today.   SLP following. Patient is currently not appropriate for PO's. Previously on a heart healthy diet consuming 50% of meals from 1/5-1/11, then made NPO. Intake has been meeting < 50% of estimated energy requirement for >/= 5 days.   Cortrak tube was placed earlier today, tip in the stomach.  Labs reviewed.  CBG's: [phone]  Medications reviewed and include vitamin C, novolog, levemir, tradjenta, solu-medrol, flomax, zinc sulfate.  IVF: D5 at 75 ml/h  Patient reports usual weight 255 lbs, 253.7 lbs on admission 1/6. No more recent weight available.  NUTRITION - FOCUSED PHYSICAL EXAM:    Most Recent Value  Orbital Region  Mild depletion  Upper Arm  Region  No depletion  Thoracic and Lumbar Region  No depletion  Buccal Region  Mild depletion  Temple Region  Moderate depletion  Clavicle Bone Region  Moderate depletion  Clavicle and Acromion Bone Region  Mild depletion  Scapular Bone Region  Unable to assess  Dorsal Hand  No depletion  Patellar Region  No depletion  Anterior Thigh Region  No depletion  Posterior Calf Region  No depletion  Edema (RD Assessment)  Mild  Hair  Reviewed  Eyes  Reviewed  Mouth  Reviewed  Skin  Reviewed  Nails  Reviewed [pale]       Diet Order:   Diet Order            Diet NPO time specified Except for: Ice Chips  Diet effective now              EDUCATION NEEDS:   Not appropriate for education at this time  Skin:  Skin Assessment: Reviewed RN Assessment  Last BM:  1/10 type 4  Height:   Ht Readings from Last 1 Encounters:  11/02/19 6' (1.829 m)    Weight:   Wt Readings from Last 1 Encounters:  11/02/19 115.3 kg    Ideal Body Weight:  80.9 kg  BMI:  Body mass index is 34.47 kg/m.  Estimated Nutritional Needs:   Kcal:  2600-2800  Protein:  130-150 gm  Fluid:  >/= 2.5 L    Molli Barrows, RD, LDN, [place] Pager  [phone] After Hours Pager [phone]

## 2019-11-09 NOTE — Progress Notes (Addendum)
Levi Jones  [id number] DOB: 1951/04/06 DOA: 11/25/2019 PCP: Colon Branch, MD    Brief Narrative:  69 year old with a history of sarcoidosis, HTN, HLD, and BPH who was transported to the ED via EMS with severe fatigue and shortness of breath for 2 to 3 days.  He tested positive for Covid 12/25.  He was admitted after he was found to be in severe hypoxic respiratory failure initially requiring nonrebreather mask as well as heated high flow nasal cannula.  He was dosed with IV steroids, remdesivir, Actemra, and convalescent plasma.  He was transferred to [facility] 1/9 with persisting continuous pulmonary status.  Significant Events: 1/5 admit to [facility] via ED 1/8 transfer to [facility] 1/12 transfer to ICU for heated high flow nasal cannula 1/13 core track placed  COVID-19 specific Treatment: Remdesivir 1/5 > 1/9 Decadron 1/5 > 1/6 Solu-Medrol 1/6 > 1/14 Actemra 1/5  Antimicrobials:  Unasyn 1/9 > 1/11 Vancomycin 1/11 >  Cefepime 11/11 >  Subjective: The patient is alert and conversant.  He tells me he is feeling better overall.  He denies current chest pain nausea or vomiting.  He does continue to feel somewhat short of breath.  Assessment & Plan:  COVID Pneumonia - acute hypoxic respiratory failure Overall condition is guarded but does appear to have stabilized at present without need for intubation -push incentive spirometer and mobility -completes his course of steroid tomorrow  Recent Labs  Lab 11/03/19 0207 11/04/19 0106 11/04/19 1745 11/05/19 0432 11/06/19 0210 11/07/19 0305 11/08/19 0300  DDIMER 1.56* 1.32*  --  2.27* 6.38* 5.56* 3.66*  FERRITIN 2,247* 1,705*  --   --   --   --   --   CRP 11.2* 7.0*  --  3.7* 2.1* <0.5 0.8  ALT 123* 114*  --  92* 57* 60* 41*  PROCALCITON  --   --  <0.10 <0.10 <0.10 0.11 <0.10    Possible aspiration pneumonia Remains on antibiotic therapy -SLP following -core track placed today to allow for nutrition  Possible  CHF Negative approximately 1 L since admission -monitor ins and outs  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 rate (!) 33, height 6' (1.829 m), weight 115.3 kg, SpO2 (!) 83 %.  Intake/Output Summary (Last 24 hours) at 11/09/2019 0952 Last data filed at 11/09/2019 0900 Gross per 24 hour  Intake 2207.91 ml  Output 1275 ml  Net 932.91 ml   Filed Weights   11/22/2019 1000 11/02/19 2053  Weight: 115.3 kg 115.3 kg    Examination: General: Alert and interactive though requiring high level oxygen support Lungs: Fine crackles diffusely without wheezing Cardiovascular: Regular rate and rhythm without murmur gallop or rub normal S1 and S2 Abdomen: Nontender, nondistended, soft, bowel sounds positive, no rebound, no ascites, no appreciable mass Extremities: No significant cyanosis, clubbing, or edema bilateral lower extremities  CBC: Recent Labs  Lab 11/06/19 0210 11/07/19 0305 11/07/19 0815 11/08/19 0300  WBC 22.0* 21.3*  --  15.9*  NEUTROABS 20.1* 19.3*  --  14.7*  HGB 12.9* 14.1 12.9* 12.8*  HCT 41.1 45.8 38.0* 41.6  MCV 88.6 90.0  --  90.8  PLT 382 358  --  123456   Basic Metabolic Panel: Recent Labs  Lab 11/06/19 0210 11/07/19 0305 11/07/19 0815 11/08/19 0300  NA 142 147* 146* 144  K 3.7 3.9 3.7 4.5  CL 106 110  --  111  CO2 23 21*  --  19*  GLUCOSE 184* 97  --  111*  BUN 44* 49*  --  35*  CREATININE 1.41* 1.39*  --  1.23  CALCIUM 8.0*  8.3*  --  7.7*  MG 2.8* 3.2*  --  2.7*   GFR: Estimated Creatinine Clearance: 76.4 mL/min (by C-G formula based on SCr of 1.23 mg/dL).  Liver Function Tests: Recent Labs  Lab 11/05/19 0432 11/06/19 0210 11/07/19 0305 11/08/19 0300  AST 46* 34 37 32  ALT 92* 74* 62* 51*  ALKPHOS 138* 120 106 89  BILITOT 1.4* 1.5* 1.6* 1.3*  PROT 7.0 6.7 7.1 5.8*  ALBUMIN 3.1* 3.1* 3.4* 2.7*    HbA1C: Hgb A1c MFr Bld  Date/Time Value Ref Range Status  09/28/2019 08:58 AM 5.3 4.6 - 6.5 % Final    Comment:    Glycemic Control Guidelines for People with Diabetes:Non Diabetic:  <6%Goal of Therapy: <7%Additional Action Suggested:  >8%   03/04/2017 03:31 PM 5.4 4.6 - 6.5 % Final    Comment:    Glycemic Control Guidelines for People with Diabetes:Non Diabetic:  <6%Goal of Therapy: <7%Additional Action Suggested:  >8%     CBG: Recent Labs  Lab 11/08/19 1146 11/08/19 1249 11/08/19 1634 11/08/19 2131 11/09/19 0900  GLUCAP 223* 208* 231* 139* 100*    Recent Results (from the past 240 hour(s))  Blood Culture (routine x 2)     Status: None   Collection Time: 11/18/2019  8:44 AM   Specimen: BLOOD  Result Value Ref Range Status   Specimen Description   Final    BLOOD RIGHT ANTECUBITAL Performed at [facility], [place] [address], [place], [place] [address]    Special Requests   Final    BOTTLES DRAWN AEROBIC AND ANAEROBIC Blood Culture adequate volume Performed at [place] [address], [place], [place] [address]    Culture   Final    NO GROWTH 5 DAYS Performed at [facility], [place] [address] [place], [place] [address]    Report Status 11/06/2019 FINAL  Final  MRSA PCR Screening     Status: None   Collection Time: 11/03/19  3:31 AM   Specimen: Nasal Mucosa; Nasopharyngeal  Result Value Ref Range Status   MRSA by PCR NEGATIVE NEGATIVE Final    Comment:        The GeneXpert MRSA Assay (FDA approved for NASAL specimens only), is one  component of a comprehensive MRSA colonization surveillance program. It is not intended to diagnose MRSA infection nor to guide or monitor treatment for MRSA infections. Performed at [facility], [place] [address], [place],  [address]   MRSA PCR Screening     Status: None   Collection Time: 11/08/19 12:42 PM   Specimen: Nasal Mucosa; Nasopharyngeal  Result Value Ref Range Status   MRSA by PCR NEGATIVE NEGATIVE Final    Comment:        The GeneXpert MRSA Assay (FDA approved for NASAL specimens only), is one component of a comprehensive MRSA colonization surveillance program. It is not intended to  diagnose MRSA infection nor to guide or monitor treatment for MRSA infections. Performed at [facility], [place] [address] [place], [place] [address]      Scheduled Meds: . albuterol  2 puff Inhalation Q6H  . vitamin C  500 mg Oral Daily  . aspirin EC  81 mg Oral Daily  . atorvastatin  20 mg Oral QHS  . chlorhexidine  15 mL Mouth Rinse BID  . Chlorhexidine Gluconate Cloth  6 each Topical Daily  . enoxaparin (LOVENOX) injection  40 mg Subcutaneous Q12H  . insulin aspart  0-20 Units Subcutaneous TID WC  . insulin aspart  0-5 Units Subcutaneous QHS  . insulin aspart  6 Units Subcutaneous TID WC  . insulin detemir  8 Units Subcutaneous QHS  . ipratropium  2 puff Inhalation Q6H  . linagliptin  5 mg Oral Daily  . loratadine  10 mg Oral Daily  . mouth rinse  15 mL Mouth Rinse BID  . mouth rinse  15 mL Mouth Rinse q12n4p  . methylPREDNISolone (SOLU-MEDROL) injection  60 mg Intravenous Daily  . QUEtiapine  25 mg Oral BID  . tamsulosin  0.4 mg Oral Daily  . zinc sulfate  220 mg Oral Daily     LOS: 8 days   Cherene Altes, MD [facility] Office  [phone] Pager - Text Page per Amion  If 7PM-7AM, please contact night-coverage per Amion 11/09/2019, 9:52 AM

## 2019-11-09 NOTE — Progress Notes (Signed)
SLP Cancellation Note  Patient Details Name: Levi Jones MRN: [id number] DOB: 1951-01-20   Cancelled treatment:  Pt has been transferred to ICU; remains inappropriate for POs. SLP will follow along for readiness for re-assessment.   Demeisha Geraghty L. Tivis Ringer, [place] CCC/SLP Acute Rehabilitation Services Office number [phone] Pager [phone]          [name] 11/09/2019, 12:10 PM

## 2019-11-09 NOTE — Procedures (Signed)
Cortrak  Person Inserting Tube:  Maylon Peppers C, RD Tube Type:  Cortrak - 43 inches Tube Location:  Left nare Initial Placement:  Stomach Secured by: Bridle Technique Used to Measure Tube Placement:  Documented cm marking at nare/ corner of mouth Cortrak Secured At:  67 cm    Cortrak Tube Team Note:  Consult received to place a Cortrak feeding tube.   No x-ray is required. RN may begin using tube.    If the tube becomes dislodged please keep the tube and contact the Cortrak team at [email or web address] (password [email or web address]) for replacement.  If after hours and replacement cannot be delayed, place a NG tube and confirm placement with an abdominal x-ray.    [place], [place], [place] Pager [phone] After Hours Pager

## 2019-11-09 NOTE — Progress Notes (Signed)
RT called to room by RN who stated pt sats kept dropping into low 80's high 70's with increased RR. RN stated she wanted pt placed back on HHFNC at this time due to pt anxiety and low sats. Pt on low settings on HHFNC and will continue to titrate O2 as tolerated

## 2019-11-09 NOTE — Progress Notes (Addendum)
Gave report to [place], PCU [place]. Patient will go to Bed 61

## 2019-11-09 NOTE — Progress Notes (Signed)
Spoke to patient's wife, Charlie Polyakov and gave updates on patient. Answered questions regarding patient care.

## 2019-11-09 NOTE — Progress Notes (Signed)
Pt transported with RN from [place] ICU204-4 to [facility] 161 on 15L NRB. Pt tolerated well. Upon arrival to new room Heated high flow placed in standby and pt placed on 15L [place]ter high flow nasal cannula with a 15L NRB. Pt tolerated well. VSS. RT will continue to monitor.

## 2019-11-10 LAB — CBC WITH DIFFERENTIAL/PLATELET
Abs Immature Granulocytes: 0.19 10*3/uL — ABNORMAL HIGH (ref 0.00–0.07)
Basophils Absolute: 0 10*3/uL (ref 0.0–0.1)
Basophils Relative: 0 %
Eosinophils Absolute: 0 10*3/uL (ref 0.0–0.5)
Eosinophils Relative: 0 %
HCT: 41.5 % (ref 39.0–52.0)
Hemoglobin: 12.8 g/dL — ABNORMAL LOW (ref 13.0–17.0)
Immature Granulocytes: 1 %
Lymphocytes Relative: 4 %
Lymphs Abs: 0.7 10*3/uL (ref 0.7–4.0)
MCH: 28.9 pg (ref 26.0–34.0)
MCHC: 30.8 g/dL (ref 30.0–36.0)
MCV: 93.7 fL (ref 80.0–100.0)
Monocytes Absolute: 0.5 10*3/uL (ref 0.1–1.0)
Monocytes Relative: 3 %
Neutro Abs: 17.2 10*3/uL — ABNORMAL HIGH (ref 1.7–7.7)
Neutrophils Relative %: 92 %
Platelets: 166 10*3/uL (ref 150–400)
RBC: 4.43 MIL/uL (ref 4.22–5.81)
RDW: 14.4 % (ref 11.5–15.5)
WBC: 18.6 10*3/uL — ABNORMAL HIGH (ref 4.0–10.5)
nRBC: 0.2 % (ref 0.0–0.2)

## 2019-11-10 LAB — GLUCOSE, CAPILLARY
Glucose-Capillary: 102 mg/dL — ABNORMAL HIGH (ref 70–99)
Glucose-Capillary: 139 mg/dL — ABNORMAL HIGH (ref 70–99)
Glucose-Capillary: 146 mg/dL — ABNORMAL HIGH (ref 70–99)
Glucose-Capillary: 165 mg/dL — ABNORMAL HIGH (ref 70–99)
Glucose-Capillary: 202 mg/dL — ABNORMAL HIGH (ref 70–99)
Glucose-Capillary: 91 mg/dL (ref 70–99)

## 2019-11-10 LAB — COMPREHENSIVE METABOLIC PANEL
ALT: 37 U/L (ref 0–44)
AST: 34 U/L (ref 15–41)
Albumin: 2.8 g/dL — ABNORMAL LOW (ref 3.5–5.0)
Alkaline Phosphatase: 91 U/L (ref 38–126)
Anion gap: 12 (ref 5–15)
BUN: 34 mg/dL — ABNORMAL HIGH (ref 8–23)
CO2: 16 mmol/L — ABNORMAL LOW (ref 22–32)
Calcium: 7.9 mg/dL — ABNORMAL LOW (ref 8.9–10.3)
Chloride: 113 mmol/L — ABNORMAL HIGH (ref 98–111)
Creatinine, Ser: 1.2 mg/dL (ref 0.61–1.24)
GFR calc Af Amer: >60 mL/min (ref >60)
GFR calc non Af Amer: >60 mL/min (ref >60)
Glucose, Bld: 85 mg/dL (ref 70–99)
Potassium: 4.7 mmol/L (ref 3.5–5.1)
Sodium: 141 mmol/L (ref 135–145)
Total Bilirubin: 1 mg/dL (ref 0.3–1.2)
Total Protein: 5.7 g/dL — ABNORMAL LOW (ref 6.5–8.1)

## 2019-11-10 LAB — C-REACTIVE PROTEIN: CRP: 0.6 mg/dL (ref <1.0)

## 2019-11-10 LAB — PROCALCITONIN: Procalcitonin: 0.25 ng/mL

## 2019-11-10 LAB — MAGNESIUM: Magnesium: 2.8 mg/dL — ABNORMAL HIGH (ref 1.7–2.4)

## 2019-11-10 LAB — D-DIMER, QUANTITATIVE: D-Dimer, Quant: 8 ug/mL-FEU — ABNORMAL HIGH (ref 0.00–0.50)

## 2019-11-10 MED ORDER — INSULIN ASPART 100 UNIT/ML ~~LOC~~ SOLN
0.0000 [IU] | Freq: Three times a day (TID) | SUBCUTANEOUS | Status: DC
  Administered 2019-11-11 (×2): 2 [IU] via SUBCUTANEOUS
  Administered 2019-11-11: 17:00:00 5 [IU] via SUBCUTANEOUS
  Administered 2019-11-12: 17:00:00 3 [IU] via SUBCUTANEOUS
  Administered 2019-11-12: 2 [IU] via SUBCUTANEOUS
  Administered 2019-11-13 (×3): 5 [IU] via SUBCUTANEOUS
  Administered 2019-11-14: 12:00:00 8 [IU] via SUBCUTANEOUS
  Administered 2019-11-14 (×2): 3 [IU] via SUBCUTANEOUS
  Administered 2019-11-15: 8 [IU] via SUBCUTANEOUS
  Administered 2019-11-15: 08:00:00 3 [IU] via SUBCUTANEOUS
  Administered 2019-11-15 – 2019-11-16 (×4): 5 [IU] via SUBCUTANEOUS

## 2019-11-10 MED ORDER — INSULIN ASPART 100 UNIT/ML ~~LOC~~ SOLN
0.0000 [IU] | Freq: Every day | SUBCUTANEOUS | Status: DC
  Administered 2019-11-10 – 2019-11-12 (×2): 2 [IU] via SUBCUTANEOUS
  Administered 2019-11-15: 21:00:00 4 [IU] via SUBCUTANEOUS

## 2019-11-10 MED ORDER — DEXTROSE-NACL 5-0.45 % IV SOLN: INTRAVENOUS | Status: DC

## 2019-11-10 NOTE — Progress Notes (Addendum)
RN went into room to check on pt. Pt found with Cortrak in lap, bridle still in place. Pt said "it was hurting". Cortrak intact, bridle removed by RN. MD and charge RN notified.  0400: CBG 91, MD notified and IV fluids restarted.  0630: Pt initially stated that he would not want Cortrak placed again. This morning he says "if you think I should" and when asked if he would pull it out again he stated he would "try my best not to" Pt is alert and oriented x4.

## 2019-11-10 NOTE — Progress Notes (Signed)
Physical Therapy Treatment Patient Details Name: Levi Jones MRN: [id number] DOB: [birth year] Today's Date: 11/10/2019    History of Present Illness 69 year old male patient admitted with COVID/acute respiratory failure; PMH includes Sarcoidosis,HTN, HLD, and BPH- condition (per chart) noted to be tenuous. Cleared with RN to perform PT evaluation    PT Comments    Pt did well with mobility and tx this day. He was found in bed on 30L via [place] and 15L NRB. At rest sats in low 90s, with mobility noted desat to 80% min with sit<>stand at edge of bed with mod a. Pt was able to get to sitting edge of bed with mod a, able to sit supported 3-4 mins, edge of bed sats in mid to high 80s, noted min desat to 80% with sit<>stand at EOB. throughout session pt denies pain but had multi instances of making very gruff noises.      Follow Up Recommendations  Home health PT     Equipment Recommendations       Recommendations for Other Services       Precautions / Restrictions Precautions Precautions: Fall Precaution Comments: 02 sats Restrictions Weight Bearing Restrictions: (P) No    Mobility  Bed Mobility Overal bed mobility: Needs Assistance Bed Mobility: Rolling;Supine to Sit;Sit to Supine Rolling: Min assist   Supine to sit: Mod assist Sit to supine: Mod assist   General bed mobility comments: needs a with moving legs to and from bed and also trunk support to get to sitting position  Transfers Overall transfer level: Needs assistance Equipment used: None;1 person hand held assist Transfers: Sit to/from Stand Sit to Stand: Mod assist         General transfer comment: was able to stand with 1 person hand  held assist but needed second person to assist with lines and straightening bed  Ambulation/Gait             General Gait Details: did not attempt ambulation this session   Stairs             Wheelchair Mobility    Modified Rankin (Stroke Patients  Only)       Balance Overall balance assessment: Needs assistance Sitting-balance support: Feet supported;Bilateral upper extremity supported Sitting balance-Leahy Scale: Fair     Standing balance support: During functional activity;Bilateral upper extremity supported Standing balance-Leahy Scale: Fair                              Cognition Arousal/Alertness: Lethargic Behavior During Therapy: Anxious;Agitated Overall Cognitive Status: Within Functional Limits for tasks assessed                                 General Comments: still moaning and grunting but denies pain says hes just trying to breathe      Exercises      General Comments        Pertinent Vitals/Pain Pain Assessment: No/denies pain(but does make a lot of noises, states trying to breathe)    Home Living                      Prior Function            PT Goals (current goals can now be found in the care plan section) Acute Rehab PT Goals Patient Stated Goal: no stated goal but makes  a lot of grunting noises PT Goal Formulation: With patient Time For Goal Achievement: 11/20/19 Potential to Achieve Goals: Fair Progress towards PT goals: Progressing toward goals    Frequency    Min 3X/week      PT Plan Current plan remains appropriate    Co-evaluation              AM-PAC PT "6 Clicks" Mobility   Outcome Measure  Help needed turning from your back to your side while in a flat bed without using bedrails?: A Little Help needed moving from lying on your back to sitting on the side of a flat bed without using bedrails?: A Lot Help needed moving to and from a bed to a chair (including a wheelchair)?: A Lot Help needed standing up from a chair using your arms (e.g., wheelchair or bedside chair)?: A Lot Help needed to walk in hospital room?: A Lot Help needed climbing 3-5 steps with a railing? : Total 6 Click Score: 12    End of Session Equipment Utilized  During Treatment: Oxygen Activity Tolerance: Patient limited by fatigue;Treatment limited secondary to medical complications (Comment) Patient left: in bed;with call bell/phone within reach;with nursing/sitter in room Nurse Communication: Mobility status;Other (comment)(nurse in room during and after tx) PT Visit Diagnosis: Muscle weakness (generalized) (M62.81)     Time: [id number] PT Time Calculation (min) (ACUTE ONLY): 23 min  Charges:  $Therapeutic Activity: 23-37 mins                     Horald Chestnut, PT    Delford Field 11/10/2019, 4:04 PM

## 2019-11-10 NOTE — Progress Notes (Signed)
   Vital Signs MEWS/VS Documentation      11/10/2019 0000 11/10/2019 0015 11/10/2019 0030 11/10/2019 0045   MEWS Score:  4  4  3  4    MEWS Score Color:  Red  Red  Yellow  Red   Resp:  (!) 38  (!) 41  (!) 32  (!) 38   Pulse:  (!) 101  (!) 101  (!) 103  (!) 101   BP:  110/74  --  --  --   Temp:  98.5 F (36.9 C)  --  --  --   O2 Device:  HFNC;Non-rebreather Mask  --  --  --   O2 Flow Rate (L/min):  20 L/min  --  --  --   FiO2 (%):  80 %  --  --  --   Level of Consciousness:  Alert  --  --  --       Notified MD and Charge RN of MEWs score change. Pt given PRN medication. Will continue to monitor.     [name] 11/10/2019,12:52 AM

## 2019-11-10 NOTE — Progress Notes (Signed)
Levi Jones  [id number] DOB: Jan 04, 1951 DOA: 10/31/2019 PCP: Colon Branch, MD    Brief Narrative:  [phone] with a history of sarcoidosis, HTN, HLD, and BPH who was transported to the ED via EMS with 2-3 days of severe fatigue and shortness of breath.  He tested positive for Covid 12/25.  He was admitted after he was found to be in severe hypoxic respiratory failure initially requiring nonrebreather mask as well as heated high flow nasal cannula.  He was dosed with IV steroids, remdesivir, Actemra, and convalescent plasma.  He was transferred to [facility] 1/9 with persisting tenuous pulmonary status.  Significant Events: 12/25 COVID test + 1/5 admit to [facility] via ED 1/8 transfer to [facility] 1/12 transfer to ICU for heated high flow nasal cannula 1/13 core track placed - returned to PCU   COVID-19 specific Treatment: Remdesivir 1/5 > 1/9 Decadron 1/5 > 1/6 Solu-Medrol 1/6 > 1/14 Actemra 1/5  Antimicrobials:  Unasyn 1/9 > 1/11 Vancomycin 1/11 > 11/12 Cefepime 11/11 >  Subjective: The patient pulled out his core track tube last night/early this morning. Cont to require HFNC support.  Denies any new complaints today. Participating w/ PT. SLP following.   Assessment & Plan:  COVID Pneumonia - acute hypoxic respiratory failure push incentive spirometer and mobility -complete his course of steroid - mobilize  Recent Labs  Lab 11/04/19 0106 11/04/19 0106 11/04/19 1745 11/05/19 0432 11/06/19 0210 11/07/19 0305 11/08/19 0300 11/10/19 0329  DDIMER 1.32*   < >  --  2.27* 6.38* 5.56* 3.66* 8.00*  FERRITIN 1,705*  --   --   --   --   --   --   --   CRP 7.0*   < >  --  3.7* 2.1* <0.5 0.8 0.6  ALT 114*  --   --  92* 74* 62* 51*  --   PROCALCITON  --   --  <0.10 <0.10 <0.10 0.11 <0.10  --    < > = values in this interval not displayed.    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 rate (!) 37, height 6' (1.829 m), weight 115.3 kg, SpO2 (!) 89 %.  Intake/Output Summary (Last 24 hours) at 11/10/2019 0906 Last data filed at 11/10/2019 0500 Gross per 24 hour  Intake 512.38 ml  Output 1475 ml  Net -962.62 ml   Filed Weights   11/18/2019 1000 11/02/19 2053  Weight: 115.3 kg 115.3 kg    Examination: General: Alert and interactive  Lungs: Fine crackles diffusely - no wheezing  Cardiovascular: Regular rate and rhythm without murmur gallop or rub normal S1 and S2 Abdomen: Nontender, nondistended, soft, bowel sounds positive, no rebound, no ascites, no appreciable mass Extremities: trace B LE edema   CBC: Recent Labs  Lab 11/07/19 0305 11/07/19 0305 11/07/19  0815 11/08/19 0300 11/10/19 0329  WBC 21.3*  --   --  15.9* 18.6*  NEUTROABS 19.3*  --   --  14.7* 17.2*  HGB 14.1   < > 12.9* 12.8* 12.8*  HCT 45.8   < > 38.0* 41.6 41.5  MCV 90.0  --   --  90.8 93.7  PLT 358  --   --  280 166   < > = values in this interval not displayed.   Basic Metabolic Panel: Recent Labs  Lab 11/06/19 0210 11/06/19 0210 11/07/19 0305 11/07/19 0815 11/08/19 0300  NA 142   < > 147* 146* 144  K 3.7   < > 3.9 3.7 4.5  CL 106  --  110  --  111  CO2 23  --  21*  --  19*  GLUCOSE 184*  --  97  --  111*  BUN 44*  --   49*  --  35*  CREATININE 1.41*  --  1.39*  --  1.23  CALCIUM 8.0*  --  8.3*  --  7.7*  MG 2.8*  --  3.2*  --  2.7*   < > = values in this interval not displayed.   GFR: Estimated Creatinine Clearance: 76.4 mL/min (by C-G formula based on SCr of 1.23 mg/dL).  Liver Function Tests: Recent Labs  Lab 11/05/19 0432 11/06/19 0210 11/07/19 0305 11/08/19 0300  AST 46* 34 37 32  ALT 92* 74* 62* 51*  ALKPHOS 138* 120 106 89  BILITOT 1.4* 1.5* 1.6* 1.3*  PROT 7.0 6.7 7.1 5.8*  ALBUMIN 3.1* 3.1* 3.4* 2.7*    HbA1C: Hgb A1c MFr Bld  Date/Time Value Ref Range Status  09/28/2019 08:58 AM 5.3 4.6 - 6.5 % Final    Comment:    Glycemic Control Guidelines for People with Diabetes:Non Diabetic:  <6%Goal of Therapy: <7%Additional Action Suggested:  >8%   03/04/2017 03:31 PM 5.4 4.6 - 6.5 % Final    Comment:    Glycemic Control Guidelines for People with Diabetes:Non Diabetic:  <6%Goal of Therapy: <7%Additional Action Suggested:  >8%     CBG: Recent Labs  Lab 11/09/19 1559 11/09/19 2023 11/10/19 0010 11/10/19 0342 11/10/19 0755  GLUCAP 213* 188* 139* 91 102*    Recent Results (from the past 240 hour(s))  Blood Culture (routine x 2)     Status: None   Collection Time: 11/24/2019  8:44 AM   Specimen: BLOOD  Result Value Ref Range Status   Specimen Description   Final    BLOOD RIGHT ANTECUBITAL Performed at [facility], [place] [address], [place], [place] [address]    Special Requests   Final    BOTTLES DRAWN AEROBIC AND ANAEROBIC Blood Culture adequate volume Performed at [place] [address] [place], [place] [address]    Culture   Final    NO GROWTH 5 DAYS Performed at [facility], [place] [address], [place], [place] [address]    Report Status 11/06/2019 FINAL  Final  MRSA PCR Screening     Status: None   Collection Time: 11/03/19  3:31 AM   Specimen: Nasal Mucosa; Nasopharyngeal  Result Value Ref Range Status   MRSA by PCR  NEGATIVE NEGATIVE Final    Comment:        The GeneXpert MRSA Assay (FDA approved for NASAL specimens only), is one component of a comprehensive MRSA colonization surveillance program. It is not intended to diagnose MRSA infection nor  to guide or monitor treatment for MRSA infections. Performed at [facility], [place] [address] [place], [place] [address]   MRSA PCR Screening     Status: None   Collection Time: 11/08/19 12:42 PM   Specimen: Nasal Mucosa; Nasopharyngeal  Result Value Ref Range Status   MRSA by PCR NEGATIVE NEGATIVE Final    Comment:        The GeneXpert MRSA Assay (FDA approved for NASAL specimens only), is one component of a comprehensive MRSA colonization surveillance program. It is not intended to diagnose MRSA infection nor to guide or monitor treatment for MRSA infections. Performed at [facility], [place] [address], [place], [place] [address]      Scheduled Meds: . aspirin  81 mg Per Tube Daily  . chlorhexidine  15 mL Mouth Rinse BID  . Chlorhexidine Gluconate Cloth  6 each Topical Daily  . enoxaparin (LOVENOX) injection  40 mg Subcutaneous Q24H  . feeding supplement (PRO-STAT SUGAR FREE 64)  30 mL Per Tube BID  . insulin aspart  0-20 Units Subcutaneous Q4H  . insulin aspart  4 Units Subcutaneous Q4H  . insulin detemir  10 Units Subcutaneous QHS  . linagliptin  5 mg Per Tube Daily  . loratadine  10 mg Per Tube Daily  . mouth rinse  15 mL Mouth Rinse BID  . methylPREDNISolone (SOLU-MEDROL) injection  60 mg Intravenous Daily  . QUEtiapine  25 mg Per Tube BID  . tamsulosin  0.4 mg Oral Daily     LOS: 9 days   Cherene Altes, MD [facility] Office  [phone] Pager - Text Page per Amion  If 7PM-7AM, please contact night-coverage per Amion 11/10/2019, 9:06 AM

## 2019-11-10 NOTE — Progress Notes (Signed)
Updated pt wife and pt daughter via phone. Discussed plan of care and transfer from ICU to PCU. All nursing questions answered.

## 2019-11-10 NOTE — Progress Notes (Addendum)
  Speech Language Pathology Treatment: Dysphagia  Patient Details Name: Levi Jones MRN: [id number] DOB: 11/06/1950 Today's Date: 11/10/2019 Time: 1045-1100 SLP Time Calculation (min) (ACUTE ONLY): 15 min  Assessment / Plan / Recommendation Clinical Impression  Pt seen with RN at bedside, pt on NRB and HHFNC 30L/min. 80% FiO2. He is alert, participatory but moans often keeps eyes shut, but is appropriately responsive to all interventions. He is in a difficult situation; He has pulled his Cortrak but has high nutritional needs and also tenuous respiratory status. SLP offered to trial careful sips/bites to determine pts ability to consume more oral nutrition. Advanced from ice to bites of puree to sips of water. Throughout, pt demonstrates swift appropriate oral manipulation and swallow response with no coughing or wet vocal quality. Respiratory function did not change. When pushed to take consecutive swallows, pt did not truly swallow consecutively, but took single sips consecutively and was observed to have an normal respiratory swallowing pattern: inspiration, swallow, expiration.   Potentially pt could tolerate increased oral intake if he were given full staff supervision to monitor rate and provide safe set up. He would still be unlikely to meet his nutritional needs, but it may be more beneficial than a large bore feeding tube that would increase pt anxiety and discomfort. If MD were in agreement, suggest advancing to dys 3 diet with thin liquids with full supervision. Slow rate, rest breaks needed, assist with mask and feeding. Will follow for tolerance.    HPI HPI: 69 year old male with history of Sarcoidosis, HTN, HLD and BPH brought to ED by EMS due to worsening fatigue and shortness of breath for 2 to 3 days. Tested positive for COVID-19 on 12/25, he was admitted with severe hypoxic respiratory failure requiring nonrebreather mask and then 35 to 40 L of heated high flow oxygen.  He was  treated with full extent of treatment including IV steroids, remdesivir, convalescent plasma and Actemra.  Transferred to [facility] for further care on 11/05/19.       SLP Plan  Continue with current plan of care       Recommendations  Diet recommendations: Thin liquid;Dysphagia 3 (mechanical soft) Liquids provided via: Cup;Straw Medication Administration: Crushed with puree Supervision: Staff to assist with self feeding;Full supervision/cueing for compensatory strategies Compensations: Slow rate;Small sips/bites Postural Changes and/or Swallow Maneuvers: Seated upright 90 degrees                Follow up Recommendations: Other (comment) SLP Visit Diagnosis: Dysphagia, unspecified (R13.10) Plan: Continue with current plan of care       GO               Herbie [place], MA [place] Pager [phone] Office [phone]  Lynann Beaver 11/10/2019, 1:37 PM

## 2019-11-10 NOTE — Progress Notes (Signed)
called pt wife, [name],  to update on pt status overnight. All nursing questions answered at this time.

## 2019-11-11 LAB — COMPREHENSIVE METABOLIC PANEL
ALT: 40 U/L (ref 0–44)
AST: 30 U/L (ref 15–41)
Albumin: 3.1 g/dL — ABNORMAL LOW (ref 3.5–5.0)
Alkaline Phosphatase: 104 U/L (ref 38–126)
Anion gap: 11 (ref 5–15)
BUN: 41 mg/dL — ABNORMAL HIGH (ref 8–23)
CO2: 20 mmol/L — ABNORMAL LOW (ref 22–32)
Calcium: 8.4 mg/dL — ABNORMAL LOW (ref 8.9–10.3)
Chloride: 111 mmol/L (ref 98–111)
Creatinine, Ser: 1.44 mg/dL — ABNORMAL HIGH (ref 0.61–1.24)
GFR calc Af Amer: 58 mL/min — ABNORMAL LOW (ref >60)
GFR calc non Af Amer: 50 mL/min — ABNORMAL LOW (ref >60)
Glucose, Bld: 148 mg/dL — ABNORMAL HIGH (ref 70–99)
Potassium: 4.7 mmol/L (ref 3.5–5.1)
Sodium: 142 mmol/L (ref 135–145)
Total Bilirubin: 1 mg/dL (ref 0.3–1.2)
Total Protein: 6.3 g/dL — ABNORMAL LOW (ref 6.5–8.1)

## 2019-11-11 LAB — GLUCOSE, CAPILLARY
Glucose-Capillary: 215 mg/dL — ABNORMAL HIGH (ref 70–99)
Glucose-Capillary: 129 mg/dL — ABNORMAL HIGH (ref 70–99)
Glucose-Capillary: 130 mg/dL — ABNORMAL HIGH (ref 70–99)
Glucose-Capillary: 140 mg/dL — ABNORMAL HIGH (ref 70–99)
Glucose-Capillary: 207 mg/dL — ABNORMAL HIGH (ref 70–99)
Glucose-Capillary: 149 mg/dL — ABNORMAL HIGH (ref 70–99)

## 2019-11-11 LAB — CBC WITH DIFFERENTIAL/PLATELET
Abs Immature Granulocytes: 0.52 10*3/uL — ABNORMAL HIGH (ref 0.00–0.07)
Basophils Absolute: 0.1 10*3/uL (ref 0.0–0.1)
Basophils Relative: 0 %
Eosinophils Absolute: 0 10*3/uL (ref 0.0–0.5)
Eosinophils Relative: 0 %
HCT: 40.4 % (ref 39.0–52.0)
Hemoglobin: 12.4 g/dL — ABNORMAL LOW (ref 13.0–17.0)
Immature Granulocytes: 2 %
Lymphocytes Relative: 4 %
Lymphs Abs: 0.8 10*3/uL (ref 0.7–4.0)
MCH: 28.2 pg (ref 26.0–34.0)
MCHC: 30.7 g/dL (ref 30.0–36.0)
MCV: 91.8 fL (ref 80.0–100.0)
Monocytes Absolute: 0.6 10*3/uL (ref 0.1–1.0)
Monocytes Relative: 3 %
Neutro Abs: 19.5 10*3/uL — ABNORMAL HIGH (ref 1.7–7.7)
Neutrophils Relative %: 91 %
Platelets: UNDETERMINED 10*3/uL (ref 150–400)
RBC: 4.4 MIL/uL (ref 4.22–5.81)
RDW: 14.6 % (ref 11.5–15.5)
WBC: 21.4 10*3/uL — ABNORMAL HIGH (ref 4.0–10.5)
nRBC: 0.1 % (ref 0.0–0.2)

## 2019-11-11 LAB — MAGNESIUM: Magnesium: 3 mg/dL — ABNORMAL HIGH (ref 1.7–2.4)

## 2019-11-11 LAB — D-DIMER, QUANTITATIVE: D-Dimer, Quant: 14.43 ug/mL-FEU — ABNORMAL HIGH (ref 0.00–0.50)

## 2019-11-11 LAB — C-REACTIVE PROTEIN: CRP: 0.6 mg/dL (ref <1.0)

## 2019-11-11 MED ORDER — ENSURE ENLIVE PO LIQD
237.0000 mL | Freq: Three times a day (TID) | ORAL | Status: DC
  Administered 2019-11-11 – 2019-11-16 (×11): 237 mL via ORAL

## 2019-11-11 MED ORDER — SODIUM CHLORIDE 0.9 % IV BOLUS
500.0000 mL | Freq: Once | INTRAVENOUS | Status: AC
  Administered 2019-11-11: 500 mL via INTRAVENOUS

## 2019-11-11 MED ORDER — ASPIRIN 81 MG PO CHEW
81.0000 mg | CHEWABLE_TABLET | Freq: Every day | ORAL | Status: DC
  Administered 2019-11-12 – 2019-11-16 (×5): 81 mg via ORAL
  Filled 2019-11-11 (×5): qty 1

## 2019-11-11 MED ORDER — ENOXAPARIN SODIUM 120 MG/0.8ML ~~LOC~~ SOLN
1.0000 mg/kg | Freq: Two times a day (BID) | SUBCUTANEOUS | Status: DC
  Administered 2019-11-11 – 2019-11-13 (×4): 115 mg via SUBCUTANEOUS
  Filled 2019-11-11 (×7): qty 0.8

## 2019-11-11 MED ORDER — LORAZEPAM 0.5 MG PO TABS
0.5000 mg | ORAL_TABLET | Freq: Three times a day (TID) | ORAL | Status: DC | PRN
  Administered 2019-11-12 (×2): 0.5 mg
  Filled 2019-11-11 (×3): qty 1

## 2019-11-11 NOTE — Progress Notes (Signed)
Nutrition Follow-up  DOCUMENTATION CODES:   Severe malnutrition in context of acute illness/injury  INTERVENTION:    Add Ensure Enlive po TID, each supplement provides 350 kcal and 20 grams of protein  Magic cup BID with lunch and dinner, each supplement provides 290 kcal and 9 grams of protein.   NUTRITION DIAGNOSIS:   Severe Malnutrition related to acute illness(COVID-19) as evidenced by energy intake < or equal to 50% for > or equal to 5 days, moderate muscle depletion.  Ongoing   GOAL:   Patient will meet greater than or equal to 90% of their needs   Progressing  MONITOR:   TF tolerance, Skin, Labs, I & O's, Weight trends  ASSESSMENT:   69 yo male admitted with worsening fatigue and SOB x 2-3 days. Tested positive for COVID-19 on 12/25. PMH includes HTN, sarcoidosis, anemia.   Patient pulled out Cortrak the night after it was placed. SLP following. Diet advanced to dysphagia 3 with thin liquids yesterday. Patient requires full supervision by nursing staff with meals. No meal completions recorded.   Currently requiring HFNC, 30 L oxygen.  Labs reviewed.  CBG's: [phone]  Medications reviewed and include novolog SSI w/ meals and at bedtime, levemir, tradjenta, flomax.  No new weight since 1/6 on admission.    Diet Order:   Diet Order            DIET DYS 3 Room service appropriate? Yes; Fluid consistency: Thin  Diet effective now              EDUCATION NEEDS:   Not appropriate for education at this time  Skin:  Skin Assessment: Reviewed RN Assessment  Last BM:  1/10 type 4  Height:   Ht Readings from Last 1 Encounters:  11/02/19 6' (1.829 m)    Weight:   Wt Readings from Last 1 Encounters:  11/02/19 115.3 kg    Ideal Body Weight:  80.9 kg  BMI:  Body mass index is 34.47 kg/m.  Estimated Nutritional Needs:   Kcal:  [id number]  Protein:  130-150 gm  Fluid:  >/= 2.5 L    Molli Barrows, RD, LDN, [place] Pager [phone] After  Hours Pager [phone]

## 2019-11-11 NOTE — Progress Notes (Addendum)
ANTICOAGULATION CONSULT NOTE - Initial Consult  Pharmacy Consult for Lovenox Indication: VTE/PE r/o  No Known Allergies  Patient Measurements: Height: 6' (182.9 cm) Weight: 254 lb 3.1 oz (115.3 kg) IBW/kg (Calculated) : 77.6  Vital Signs: Temp: 97.3 F (36.3 C) (01/15 1600) Temp Source: Oral (01/15 1600) BP: 114/75 (01/15 1600) Pulse Rate: 100 (01/15 1118)  Labs: Recent Labs    11/10/19 0329 11/11/19 0004  HGB 12.8* 12.4*  HCT 41.5 40.4  PLT 166 PLATELET CLUMPS NOTED ON SMEAR, UNABLE TO ESTIMATE  CREATININE 1.20 1.44*    Estimated Creatinine Clearance: 65.3 mL/min (A) (by C-G formula based on SCr of 1.44 mg/dL (H)).   Medical History: Past Medical History:  Diagnosis Date  . Abnormal LFTs 07/01/2016  . ALLERGIC RHINITIS CAUSE UNSPECIFIED 03/05/2010   Qualifier: Diagnosis of  By: Stann Mainland CMA, [place]    . Anemia 07/02/2016  . Benign localized hyperplasia of prostate with urinary obstruction   . BPH (benign prostatic hyperplasia) 05/25/2018  . CAP (community acquired pneumonia) 07/01/2016  . 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 Hypertension   . Nocturia   . Osteoarthritis   . Sarcoidosis    h/o, DX in the 90's to early 2000s (Dr.Simonds)  . Sciatica 09/27/2014   Assessment: 54 YOM presenting with fatigue/SOB, COVID + with D-dimer 14.43 and to start full AC.  Currently only lovenox 40mg /24h for DVT ppx. CBC stable, no bleeding reported.   Goal of Therapy:  Anti-Xa level 0.6-1 units/ml 4hrs after LMWH dose given Monitor platelets by anticoagulation protocol: Yes   Plan:  Lovenox 1mg /kg (115mg ) SQ every 12 hours Monitor renal function, VTE/PE workup, CBC, s/s bleeding  Bertis Ruddy, PharmD Clinical Pharmacist Please check AMION for all [place] numbers 11/11/2019 5:28 PM

## 2019-11-11 NOTE — Progress Notes (Signed)
RN spoke with patient's spouse [name] via phone. All questions and concerns answered at this time. Patient resting in chair, VSS, no complaints of pain. Will continue to monitor.

## 2019-11-11 NOTE — Progress Notes (Addendum)
Levi Jones  [id number] DOB: 09/29/1951 DOA: 11/23/2019 PCP: Colon Branch, MD    Brief Narrative:  [phone] with a history of sarcoidosis, HTN, HLD, and BPH who was transported to the ED via EMS with 2-3 days of severe fatigue and shortness of breath.  He tested positive for Covid 12/25.  He was admitted after he was found to be in severe hypoxic respiratory failure initially requiring nonrebreather mask as well as heated high flow nasal cannula.  He was dosed with IV steroids, remdesivir, Actemra, and convalescent plasma.  He was transferred to [facility] 1/9 with persisting tenuous pulmonary status.  Significant Events: 12/25 COVID test + 1/5 admit to [facility] via ED 1/8 transfer to [facility] 1/12 transfer to ICU for heated high flow nasal cannula 1/13 coretrak placed - returned to PCU  1/14 pulled out his own coretrak  COVID-19 specific Treatment: Remdesivir 1/5 > 1/9 Decadron 1/5 > 1/6 Solu-Medrol 1/6 > 1/14 Actemra 1/5  Antimicrobials:  Unasyn 1/9 > 1/11 Vancomycin 1/11 > 11/12 Cefepime 11/11 > 1/13  Subjective: Appears to be in no acute resp distress, though he is still requiring signif O2 support. Affect is flat. Pt denies complaints.    Assessment & Plan:  COVID Pneumonia - acute hypoxic respiratory failure push incentive spirometer and mobility - has completed courses of steroid and remdesivir - mobilize  Recent Labs  Lab 11/05/19 0432 11/05/19 0432 11/06/19 0210 11/07/19 0305 11/08/19 0300 11/10/19 0329 11/11/19 0004  DDIMER 2.27*   < > 6.38* 5.56* 3.66* 8.00* 14.43*  CRP 3.7*   < > 2.1* <0.5 0.8 0.6 0.6  ALT 92*   < > 74* 62* 51* 37 40  PROCALCITON <0.10  --  <0.10 0.11 <0.10 0.25  --    < > = values in this interval not displayed.    Markedly elevated d-dimer  Increase to tx dose anticoag - rule out DVT - do not feel he is stable enough to go for CTa at this time - had a negative CTa chest for PE 11/03/19  Possible aspiration pneumonia SLP  following -core track pulled out by patient - see SLP note - advanced diet - follow intake   Possible CHF - unable to quantify or qualify - unclear diagnosis  Negative approximately 2.4L since admission -monitor ins and outs - EF 55-60% via TTE Sept 2018  Sarcoidosis with pulmonary involvement pathognomonic findings noted on CT chest   Possible underlying liver cirrhosis Noted on CT of chest - no known hx of this   HTN Blood pressure stable at present  HLD Avoid treatment at present given possible cirrhosis -will need follow-up as outpatient  BPH  Steroid-induced hyperglycemia CBG not significantly elevated at this time  DVT prophylaxis: Lovenox Code Status: FULL CODE Family Communication:  Disposition Plan: PCU  Consultants:  PCCM  Objective: Blood pressure 121/77, pulse (!) 103, temperature 98 F (36.7 C), temperature source Oral, resp. rate (!) 27, height 6' (1.829 m), weight 115.3 kg, SpO2 93 %.  Intake/Output Summary (Last 24 hours) at 11/11/2019 1116 Last data filed at 11/11/2019 0900 Gross per 24 hour  Intake 0 ml  Output 875 ml  Net -875 ml   Filed Weights   11/21/2019 1000 11/02/19 2053  Weight: 115.3 kg 115.3 kg    Examination: General: flat affect - no acute distress - high O2 support requirement  Lungs: Fine crackles diffusely  Cardiovascular: RRR - no M  Abdomen: Nontender, nondistended, soft, bowel sounds  positive, no rebound Extremities: trace B LE edema   CBC: Recent Labs  Lab 11/08/19 0300 11/10/19 0329 11/11/19 0004  WBC 15.9* 18.6* 21.4*  NEUTROABS 14.7* 17.2* 19.5*  HGB 12.8* 12.8* 12.4*  HCT 41.6 41.5 40.4  MCV 90.8 93.7 91.8  PLT 280 166 PLATELET CLUMPS NOTED ON SMEAR, UNABLE TO ESTIMATE   Basic Metabolic Panel: Recent Labs  Lab 11/08/19 0300 11/10/19 0329 11/11/19 0004  NA 144 141 142  K 4.5 4.7 4.7  CL 111 113* 111  CO2 19* 16* 20*  GLUCOSE 111* 85 148*  BUN 35* 34* 41*  CREATININE 1.23 1.20 1.44*  CALCIUM 7.7* 7.9*  8.4*  MG 2.7* 2.8* 3.0*   GFR: Estimated Creatinine Clearance: 65.3 mL/min (A) (by C-G formula based on SCr of 1.44 mg/dL (H)).  Liver Function Tests: Recent Labs  Lab 11/07/19 0305 11/08/19 0300 11/10/19 0329 11/11/19 0004  AST 37 32 34 30  ALT 62* 51* 37 40  ALKPHOS 106 89 91 104  BILITOT 1.6* 1.3* 1.0 1.0  PROT 7.1 5.8* 5.7* 6.3*  ALBUMIN 3.4* 2.7* 2.8* 3.1*    HbA1C: Hgb A1c MFr Bld  Date/Time Value Ref Range Status  09/28/2019 08:58 AM 5.3 4.6 - 6.5 % Final    Comment:    Glycemic Control Guidelines for People with Diabetes:Non Diabetic:  <6%Goal of Therapy: <7%Additional Action Suggested:  >8%   03/04/2017 03:31 PM 5.4 4.6 - 6.5 % Final    Comment:    Glycemic Control Guidelines for People with Diabetes:Non Diabetic:  <6%Goal of Therapy: <7%Additional Action Suggested:  >8%     CBG: Recent Labs  Lab 11/10/19 1647 11/10/19 2221 11/10/19 2346 11/11/19 0634 11/11/19 0717  GLUCAP 202* 215* 165* 130* 129*    Recent Results (from the past 240 hour(s))  MRSA PCR Screening     Status: None   Collection Time: 11/03/19  3:31 AM   Specimen: Nasal Mucosa; Nasopharyngeal  Result Value Ref Range Status   MRSA by PCR NEGATIVE NEGATIVE Final    Comment:        The GeneXpert MRSA Assay (FDA approved for NASAL specimens only), is one component of a comprehensive MRSA colonization surveillance program. It is not intended to diagnose MRSA infection nor to guide or monitor treatment for MRSA infections. Performed at [facility], [place] [address] [place], [place] [address]   MRSA PCR Screening     Status: None   Collection Time: 11/08/19 12:42 PM   Specimen: Nasal Mucosa; Nasopharyngeal  Result Value Ref Range Status   MRSA by PCR NEGATIVE NEGATIVE Final    Comment:        The GeneXpert MRSA Assay (FDA approved for NASAL specimens only), is one component of a comprehensive MRSA colonization surveillance program. It is not intended to diagnose  MRSA infection nor to guide or monitor treatment for MRSA infections. Performed at [facility], [place] [address], [place], [place] [address]      Scheduled Meds:  aspirin  81 mg Per Tube Daily   chlorhexidine  15 mL Mouth Rinse BID   Chlorhexidine Gluconate Cloth  6 each Topical Daily   enoxaparin (LOVENOX) injection  40 mg Subcutaneous Q24H   feeding supplement (ENSURE ENLIVE)  237 mL Oral TID BM   insulin aspart  0-15 Units Subcutaneous TID WC   insulin aspart  0-5 Units Subcutaneous QHS   insulin detemir  10 Units Subcutaneous QHS   linagliptin  5 mg Per Tube  Daily   loratadine  10 mg Per Tube Daily   mouth rinse  15 mL Mouth Rinse BID   QUEtiapine  25 mg Per Tube BID   tamsulosin  0.4 mg Oral Daily     LOS: 10 days   Cherene Altes, MD [facility] Office  [phone] Pager - Text Page per Shea Evans  If 7PM-7AM, please contact night-coverage per Amion 11/11/2019, 11:16 AM

## 2019-11-11 NOTE — Progress Notes (Signed)
Updated spouse, [name], via phone on patient status and plan of care. All questions answered at present time. Spouse states she will be point of contact for calls and updates.

## 2019-11-11 NOTE — Progress Notes (Signed)
Physical Therapy Treatment Patient Details Name: Levi Jones MRN: [id number] DOB: 26-Jul-1951 Today's Date: 11/11/2019    History of Present Illness 69 year old male patient admitted with COVID/acute respiratory failure; PMH includes Sarcoidosis,HTN, HLD, and BPH- condition (per chart) noted to be tenuous. Cleared with RN to perform PT evaluation    PT Comments    Pt still making progress with mobility. Did very well during this session. Pt was found in recliner, agreeable to tx, worked on standing and also ambulation this session. Pt on 30L/HHF and 15L NRB sats in 90s on ear lobe probe at rest, with activity sats decrease to low 80s on earlobe probe, pleth irratic and non uniform. placed pt on Nelcor forehead probe and sats reading in 70s after activity, pleth more uniform on this, HR in 110 range on both monitors. Pt looks fatigued w/ increased work of breathing.Pt takes increased time to recover and becomes very anxious with RR in 40s, pt needing increased cues verbal and tactile to slow down breathing with pursed lip breathing.     Follow Up Recommendations  Home health PT;Supervision/Assistance - 24 hour     Equipment Recommendations  Rolling walker with 5" wheels    Recommendations for Other Services       Precautions / Restrictions Precautions Precautions: Fall Precaution Comments: monitor 02 sats Restrictions Weight Bearing Restrictions: No    Mobility  Bed Mobility               General bed mobility comments: pt sitting in recliner at therapist arrival to room  Transfers Overall transfer level: Needs assistance Equipment used: Rolling walker (2 wheeled) Transfers: Sit to/from Stand Sit to Stand: Min assist            Ambulation/Gait Ambulation/Gait assistance: Mod assist Gait Distance (Feet): 5 Feet Assistive device: Rolling walker (2 wheeled) Gait Pattern/deviations: Wide base of support;Shuffle     General Gait Details: ambulated few steps at  bedside, pt became very anxious with this   Stairs             Wheelchair Mobility    Modified Rankin (Stroke Patients Only)       Balance Overall balance assessment: Needs assistance Sitting-balance support: Feet supported Sitting balance-Leahy Scale: Fair     Standing balance support: During functional activity;Bilateral upper extremity supported Standing balance-Leahy Scale: Fair                              Cognition Arousal/Alertness: Lethargic Behavior During Therapy: Anxious;Agitated Overall Cognitive Status: Within Functional Limits for tasks assessed                                        Exercises General Exercises - Lower Extremity Long Arc Quad: AROM;Strengthening;Both;10 reps    General Comments General comments (skin integrity, edema, etc.): Pt on 30L/HHF and 15L NRB sats in 90s on ear lobe probe at rest, with activity sats decrease to low 80s on earlobe probe, pleth irratic and non uniform. placed pt on nelcor forehead probe and sats reading in 70s after activity, pleth more uniform on this, HR in 110 range on both monitors. Pt looks fatigued w/ increased work of breathing      Pertinent Vitals/Pain Pain Assessment: No/denies pain    Home Living  Prior Function            PT Goals (current goals can now be found in the care plan section) Acute Rehab PT Goals PT Goal Formulation: With patient Time For Goal Achievement: 11/20/19 Potential to Achieve Goals: Fair Progress towards PT goals: Progressing toward goals    Frequency    Min 3X/week      PT Plan Current plan remains appropriate    Co-evaluation              AM-PAC PT "6 Clicks" Mobility   Outcome Measure  Help needed turning from your back to your side while in a flat bed without using bedrails?: A Little Help needed moving from lying on your back to sitting on the side of a flat bed without using bedrails?: A  Lot Help needed moving to and from a bed to a chair (including a wheelchair)?: A Lot Help needed standing up from a chair using your arms (e.g., wheelchair or bedside chair)?: A Lot Help needed to walk in hospital room?: A Lot Help needed climbing 3-5 steps with a railing? : Total 6 Click Score: 12    End of Session Equipment Utilized During Treatment: Oxygen;Gait belt Activity Tolerance: Patient limited by fatigue;Patient limited by lethargy;Treatment limited secondary to medical complications (Comment) Patient left: in chair;with call bell/phone within reach;with chair alarm set Nurse Communication: Mobility status(nurse in room part of tx session) PT Visit Diagnosis: Muscle weakness (generalized) (M62.81)     Time: 1000-1044 PT Time Calculation (min) (ACUTE ONLY): 44 min  Charges:  $Gait Training: 8-22 mins $Therapeutic Activity: 8-22 mins $Self Care/Home Management: Secretary, PT    Delford Field 11/11/2019, 2:01 PM

## 2019-11-11 NOTE — Progress Notes (Signed)
  Speech Language Pathology Treatment: Dysphagia  Patient Details Name: Levi Jones MRN: [id number] DOB: 1951-09-28 Today's Date: 11/11/2019 Time: [id number] SLP Time Calculation (min) (ACUTE ONLY): 12 min  Assessment / Plan / Recommendation Clinical Impression  Pt transferred out of ICU.  Respiratory demands have decreased -now on 30L heated HFNC, 50% Fi02 and is no longer on NRB at this time.  Pt seated in recliner.  He consumed three oz of water, 2 oz of Ensure, and had several bites of a cracker with adequate attention, pacing, and no overt concerns for aspiration.  SP02 remained >90% and RR hovered 28-32 while eating.  He could not verbalize precautions but did not appear impulsive and overall tolerated POs well.  Recommend continuing dysphagia 3, thin liquids, with slow rate, frequent rest breaks. Assist pt with tray set up and monitor safety as needed.  SLP will follow.    HPI HPI: 69 year old male with history of Sarcoidosis, HTN, HLD and BPH brought to ED by EMS due to worsening fatigue and shortness of breath for 2 to 3 days. Tested positive for COVID-19 on 12/25, he was admitted with severe hypoxic respiratory failure requiring nonrebreather mask and then 35 to 40 L of heated high flow oxygen.  He was treated with full extent of treatment including IV steroids, remdesivir, convalescent plasma and Actemra.  Transferred to [facility] for further care on 11/05/19.       SLP Plan  Continue with current plan of care       Recommendations  Diet recommendations: Dysphagia 3 (mechanical soft);Thin liquid Liquids provided via: Cup;Straw Medication Administration: Crushed with puree Supervision: Staff to assist with self feeding;Full supervision/cueing for compensatory strategies Compensations: Slow rate;Small sips/bites Postural Changes and/or Swallow Maneuvers: Seated upright 90 degrees                Oral Care Recommendations: Oral care BID Follow up Recommendations: Other  (comment)(tba) Plan: Continue with current plan of care       GO                Assunta Curtis 11/11/2019, 1:58 PM  Clyde Zarrella L. Tivis Ringer, [place] Office number [phone] Pager [phone]

## 2019-11-11 NOTE — Progress Notes (Signed)
Daughter updated via phone on patient status and plan of care. All questions answered at present time.

## 2019-11-11 NOTE — Progress Notes (Signed)
RN x2 assisted patient in standing and repositioning in chair at this time. Patient's spO2 dropped to 70-80s with exertion but recovered to 90s quickly. Patient RR in 30-40s at this time, patient states he feels like he is trying to catch his breath. MD aware of intermittent tachypnea. HHFNC adjusted to 30L and 60% FiO2 while patient recovers. Will continue to monitor.

## 2019-11-12 LAB — COMPREHENSIVE METABOLIC PANEL
ALT: 44 U/L (ref 0–44)
AST: 31 U/L (ref 15–41)
Albumin: 2.8 g/dL — ABNORMAL LOW (ref 3.5–5.0)
Alkaline Phosphatase: 93 U/L (ref 38–126)
Anion gap: 9 (ref 5–15)
BUN: 44 mg/dL — ABNORMAL HIGH (ref 8–23)
CO2: 21 mmol/L — ABNORMAL LOW (ref 22–32)
Calcium: 7.9 mg/dL — ABNORMAL LOW (ref 8.9–10.3)
Chloride: 110 mmol/L (ref 98–111)
Creatinine, Ser: 1.31 mg/dL — ABNORMAL HIGH (ref 0.61–1.24)
GFR calc Af Amer: >60 mL/min (ref >60)
GFR calc non Af Amer: 56 mL/min — ABNORMAL LOW (ref >60)
Glucose, Bld: 101 mg/dL — ABNORMAL HIGH (ref 70–99)
Potassium: 4.2 mmol/L (ref 3.5–5.1)
Sodium: 140 mmol/L (ref 135–145)
Total Bilirubin: 0.8 mg/dL (ref 0.3–1.2)
Total Protein: 5.8 g/dL — ABNORMAL LOW (ref 6.5–8.1)

## 2019-11-12 LAB — CBC
HCT: 39.3 % (ref 39.0–52.0)
Hemoglobin: 12 g/dL — ABNORMAL LOW (ref 13.0–17.0)
MCH: 28 pg (ref 26.0–34.0)
MCHC: 30.5 g/dL (ref 30.0–36.0)
MCV: 91.8 fL (ref 80.0–100.0)
Platelets: 124 10*3/uL — ABNORMAL LOW (ref 150–400)
RBC: 4.28 MIL/uL (ref 4.22–5.81)
RDW: 15.1 % (ref 11.5–15.5)
WBC: 18.2 10*3/uL — ABNORMAL HIGH (ref 4.0–10.5)
nRBC: 1 % — ABNORMAL HIGH (ref 0.0–0.2)

## 2019-11-12 LAB — GLUCOSE, CAPILLARY
Glucose-Capillary: 90 mg/dL (ref 70–99)
Glucose-Capillary: 125 mg/dL — ABNORMAL HIGH (ref 70–99)
Glucose-Capillary: 164 mg/dL — ABNORMAL HIGH (ref 70–99)
Glucose-Capillary: 250 mg/dL — ABNORMAL HIGH (ref 70–99)

## 2019-11-12 LAB — D-DIMER, QUANTITATIVE: D-Dimer, Quant: >20 ug/mL-FEU — ABNORMAL HIGH (ref 0.00–0.50)

## 2019-11-12 MED ORDER — LORATADINE 10 MG PO TABS
10.0000 mg | ORAL_TABLET | Freq: Every day | ORAL | Status: DC
  Administered 2019-11-13 – 2019-11-16 (×4): 10 mg via ORAL
  Filled 2019-11-12 (×4): qty 1

## 2019-11-12 MED ORDER — MORPHINE SULFATE (PF) 2 MG/ML IV SOLN
1.0000 mg | Freq: Once | INTRAVENOUS | Status: AC
  Administered 2019-11-12: 22:00:00 0.5 mg via INTRAVENOUS
  Filled 2019-11-12: qty 1

## 2019-11-12 MED ORDER — LINAGLIPTIN 5 MG PO TABS
5.0000 mg | ORAL_TABLET | Freq: Every day | ORAL | Status: DC
  Filled 2019-11-12: qty 1

## 2019-11-12 MED ORDER — LORAZEPAM 0.5 MG PO TABS: 0.5000 mg | ORAL_TABLET | Freq: Three times a day (TID) | ORAL | Status: DC | PRN

## 2019-11-12 MED ORDER — POLYETHYLENE GLYCOL 3350 17 G PO PACK: 17.0000 g | PACK | Freq: Every day | ORAL | Status: DC | PRN

## 2019-11-12 MED ORDER — FUROSEMIDE 10 MG/ML IJ SOLN
40.0000 mg | Freq: Once | INTRAMUSCULAR | Status: AC
  Administered 2019-11-12: 40 mg via INTRAVENOUS
  Filled 2019-11-12: qty 4

## 2019-11-12 MED ORDER — HALOPERIDOL LACTATE 5 MG/ML IJ SOLN
2.0000 mg | Freq: Four times a day (QID) | INTRAMUSCULAR | Status: DC | PRN
  Administered 2019-11-12: 18:00:00 3 mg via INTRAVENOUS
  Administered 2019-11-15: 04:00:00 5 mg via INTRAVENOUS
  Filled 2019-11-12 (×2): qty 1

## 2019-11-12 NOTE — Progress Notes (Signed)
Pt arrived to unit in severe respiratory distress. Using accessory muscles. Pt attempted on BiPap. Pt WOB increased and appeared to be gasping for air. He was increasingly more agitated and anxious. Pt placed back on HHFNC. Pt tolerated that better. Primary RN spoke with pt wife and made her aware of pt condition and increase in level of care.  Pt O2 level dropped to low 80s Primary RN added 15L nonrebreather. PT O2 increased to 90s.Marland Kitchen Pt RR still in high 40s. And restless. Hospitalist notified and Morphine ordered for pt. See MAR.   Pt daughter Benjamine Mola called and Primary RN informed her of pt condition.

## 2019-11-12 NOTE — Progress Notes (Signed)
Patient's work of breathing remained high throughout the day. Patient became easily anxious and agitated; repeatedly removing NRB. Ativan given; not effective. PT HR remains in 120, RR in 40s; with episodes of desaturation due to patient's agitation. Provider notified. Haldol ordered and given. Will continue to monitor.

## 2019-11-12 NOTE — Progress Notes (Signed)
Pt transferred to ICU for Bipap for increased WOB. Pt placed on Bipap 8/8 100% rate 12. Pt was more SOB and gasping for air. Asked if he felt more comfortable on the HFNC and he said yes. Pt placed back on HFNC 35LPM and 40% and pt began to settle back down. RN called wife to make her aware of changes made.

## 2019-11-12 NOTE — Progress Notes (Addendum)
Levi Jones  [id number] DOB: 1951/01/12 DOA: 11/11/2019 PCP: Colon Branch, MD    Brief Narrative:  [id number] with a history of sarcoidosis, HTN, HLD, and BPH who was transported to the ED via EMS with 2-3 days of severe fatigue and shortness of breath.  He tested positive for Covid 12/25.  He was admitted after he was found to be in severe hypoxic respiratory failure initially requiring nonrebreather mask as well as heated high flow nasal cannula.  He was dosed with IV steroids, remdesivir, Actemra, and convalescent plasma.  He was transferred to [facility] 1/9 with persisting tenuous pulmonary status.  Significant Events: 12/25 COVID test + 1/5 admit to [facility] via ED 1/8 transfer to [facility] 1/12 transfer to ICU for heated high flow nasal cannula 1/13 coretrak placed - returned to PCU  1/14 pulled out his own coretrak  COVID-19 specific Treatment: Remdesivir 1/5 > 1/9 Decadron 1/5 > 1/6 Solu-Medrol 1/6 > 1/14 Actemra 1/5  Antimicrobials:  Unasyn 1/9 > 1/11 Vancomycin 1/11 > 11/12 Cefepime 11/11 > 1/13  Subjective: The patient is obviously very tired.  He awakens to my voice and speaks with me.  Work of breathing remains high.  He continues to require high level oxygen support.  We had a lengthy discussion concerning his disease state.  I explained that there is evidence of chronic underlying lung disease likely related to sarcoidosis on his CT images.  I have explained the severe inflammation related to his Covid pneumonia and the fact that his recovery will continue to be very slow if it does progress.  I explained to him my concern that should he experience worsening of his respiratory failure this would likely be an event from which we would not be able to reverse his decline.  I asked him about intubation and mechanical ventilation and he tells me he would not wish to be intubated or placed on the ventilator should he worsen.  I explained to him that I agree that  intubation and mechanical ventilation would not correct the underlying issues and would not be appropriate should he decline to that point.  I have reassured him however that we will continue to do everything we can short of that to bring about improvement.  I have counseled him that improvement will be very slow in coming and require his ability to "hang in there" for many more days in his current state.  He tells me he very much wants to go home but does wish to continue to fight within these limitations we have established and understands that he must therefore stay in the hospital.  Assessment & Plan:  COVID Pneumonia - acute hypoxic respiratory failure Cont to push incentive spirometer and mobility - has completed courses of steroid and remdesivir - mobilize - attempt to diurese as able   Recent Labs  Lab 11/06/19 0210 11/06/19 0210 11/07/19 0305 11/08/19 0300 11/10/19 0329 11/11/19 0004 11/12/19 0101  DDIMER 6.38*   < > 5.56* 3.66* 8.00* 14.43* >20.00*  CRP 2.1*  --  <0.5 0.8 0.6 0.6  --   ALT 74*   < > 62* 51* 37 40 44  PROCALCITON <0.10  --  0.11 <0.10 0.25  --   --    < > = values in this interval not displayed.    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 noted on CT chest 11/03/19, and also per report present in 2018  Possible underlying liver cirrhosis Noted on CT of chest - no known hx of this   HTN Blood pressure stable    HLD Avoid treatment at present given possible cirrhosis -will need follow-up as outpatient  BPH  Steroid-induced hyperglycemia CBG not significantly elevated at this time  DVT prophylaxis: Lovenox Code Status: DNR - NO CODE BLUE per my discussion w/ him today  Family Communication:  Disposition Plan: PCU  Consultants:  PCCM  Objective: Blood pressure 119/81, pulse 98, temperature 99.1 F (37.3 C), temperature source Axillary, resp. rate (!) 25, height 6' (1.829 m), weight 115.3 kg, SpO2 96 %.  Intake/Output Summary (Last 24 hours) at 11/12/2019 0937 Last data filed at 11/12/2019 0800 Gross per 24 hour  Intake 1700 ml  Output 1175 ml  Net 525 ml   Filed Weights   11/16/2019 1000 11/02/19 2053  Weight: 115.3 kg 115.3 kg    Examination: General: high O2 support requirement continues - alert and oriented  Lungs: Fine crackles diffusely - no wheezing  Cardiovascular: RRR - no rub  Abdomen: Nontender, nondistended, soft, bowel sounds positive, no rebound Extremities: trace B LE edema w/o change   CBC: Recent Labs  Lab 11/08/19 0300 11/08/19 0300 11/10/19 0329 11/11/19 0004 11/12/19 0101  WBC 15.9*   < > 18.6* 21.4* 18.2*  NEUTROABS 14.7*  --  17.2* 19.5*  --   HGB 12.8*   < > 12.8* 12.4* 12.0*  HCT 41.6   < > 41.5 40.4 39.3  MCV 90.8   < > 93.7 91.8 91.8  PLT 280   < > 166 PLATELET CLUMPS NOTED ON SMEAR, UNABLE TO ESTIMATE 124*   < > = values in this interval not displayed.   Basic Metabolic Panel: Recent Labs  Lab 11/08/19 0300 11/08/19 0300 11/10/19 0329 11/11/19 0004 11/12/19 0101  NA 144   < > 141 142 140  K 4.5   < > 4.7 4.7 4.2  CL 111   < > 113* 111 110  CO2 19*   < > 16* 20* 21*  GLUCOSE 111*   < > 85 148* 101*  BUN 35*   < > 34* 41* 44*  CREATININE 1.23   < > 1.20 1.44* 1.31*  CALCIUM 7.7*   < > 7.9* 8.4* 7.9*  MG 2.7*  --  2.8* 3.0*  --    < > = values in this interval not displayed.   GFR: Estimated Creatinine Clearance: 71.7 mL/min (A)  (by C-G formula based on SCr of 1.31 mg/dL (H)).  Liver Function Tests: Recent Labs  Lab 11/08/19 0300 11/10/19 0329 11/11/19 0004 11/12/19 0101  AST 32 34 30 31  ALT 51* 37 40 44  ALKPHOS 89 91 104 93  BILITOT 1.3* 1.0 1.0 0.8  PROT 5.8* 5.7* 6.3* 5.8*  ALBUMIN 2.7* 2.8* 3.1* 2.8*    HbA1C: Hgb A1c MFr Bld  Date/Time Value Ref Range Status  09/28/2019 08:58 AM 5.3 4.6 -  6.5 % Final    Comment:    Glycemic Control Guidelines for People with Diabetes:Non Diabetic:  <6%Goal of Therapy: <7%Additional Action Suggested:  >8%   03/04/2017 03:31 PM 5.4 4.6 - 6.5 % Final    Comment:    Glycemic Control Guidelines for People with Diabetes:Non Diabetic:  <6%Goal of Therapy: <7%Additional Action Suggested:  >8%     CBG: Recent Labs  Lab 11/11/19 0717 11/11/19 1116 11/11/19 1723 11/11/19 2135 11/12/19 0735  GLUCAP 129* 140* 207* 149* 90    Recent Results (from the past 240 hour(s))  MRSA PCR Screening     Status: None   Collection Time: 11/03/19  3:31 AM   Specimen: Nasal Mucosa; Nasopharyngeal  Result Value Ref Range Status   MRSA by PCR NEGATIVE NEGATIVE Final    Comment:        The GeneXpert MRSA Assay (FDA approved for NASAL specimens only), is one component of a comprehensive MRSA colonization surveillance program. It is not intended to diagnose MRSA infection nor to guide or monitor treatment for MRSA infections. Performed at [facility], [place] [address] [place], [place] [address]   MRSA PCR Screening     Status: None   Collection Time: 11/08/19 12:42 PM   Specimen: Nasal Mucosa; Nasopharyngeal  Result Value Ref Range Status   MRSA by PCR NEGATIVE NEGATIVE Final    Comment:        The GeneXpert MRSA Assay (FDA approved for NASAL specimens only), is one component of a comprehensive MRSA colonization surveillance program. It is not intended to diagnose MRSA infection nor to guide or monitor treatment for MRSA infections. Performed at  [facility], [place] [address], [place],  [address]      Scheduled Meds: . aspirin  81 mg Oral Daily  . chlorhexidine  15 mL Mouth Rinse BID  . Chlorhexidine Gluconate Cloth  6 each Topical Daily  . enoxaparin (LOVENOX) injection  1 mg/kg Subcutaneous Q12H  . feeding supplement (ENSURE ENLIVE)  237 mL Oral TID BM  . insulin aspart  0-15 Units Subcutaneous TID WC  . insulin aspart  0-5 Units Subcutaneous QHS  . insulin detemir  10 Units Subcutaneous QHS  . linagliptin  5 mg Per Tube Daily  . loratadine  10 mg Per Tube Daily  . mouth rinse  15 mL Mouth Rinse BID  . tamsulosin  0.4 mg Oral Daily     LOS: 11 days   Cherene Altes, MD [facility] Office  [phone] Pager - Text Page per Amion  If 7PM-7AM, please contact night-coverage per Amion 11/12/2019, 9:37 AM

## 2019-11-12 NOTE — Progress Notes (Signed)
1930 Patient is in respiratory distress, accessory muscle use noted, RR 50s, HR 130s with frequent PVCs,O2 sat 93%, patient is moaning.  [id number] Dr. Andria Frames returned page, notified him of patient respiratory distress, new order received from cpap/bipap. 23 Notified Charge nurse of new order for cpap/bipap, will call RT.  1945 RT on phone, unable to do bipap in PCU, will increase HHFNC to 40L, Dr. Andria Frames notified.  2005 Dr. Andria Frames on phone, order received to transfer patient to ICU for [place].  2025 Report given to RN  2040 Patient transported to ICU room 203-4 2055 Patient's wife called, no answer.

## 2019-11-12 NOTE — Plan of Care (Signed)
  Problem: Education: Goal: Knowledge of General Education information will improve Description: Including pain rating scale, medication(s)/side effects and non-pharmacologic comfort measures Outcome: Progressing   Problem: Health Behavior/Discharge Planning: Goal: Ability to manage health-related needs will improve Outcome: Progressing   Problem: Clinical Measurements: Goal: Will remain free from infection Outcome: Progressing   Problem: Pain Managment: Goal: General experience of comfort will improve Outcome: Progressing   Problem: Education: Goal: Knowledge of risk factors and measures for prevention of condition will improve Outcome: Progressing   Problem: Respiratory: Goal: Will maintain a patent airway Outcome: Progressing

## 2019-11-12 NOTE — Progress Notes (Signed)
Patient spouse, [name], updated via phone on patient status. All questions answered at present time.

## 2019-11-12 NOTE — Progress Notes (Signed)
Patient desaturates to 70-80's and continues to need additional oxygen support with any type of movement or exertion. Tachypnea throughout the night, 20's when resting and 30-40's with exertion. PRN ativan given at 0525 for anxiety. Verbal cues when drinking to take small sips and frequent breaks. Patient remains A/Ox4.  No c/o pain or discomfort. Patient will moan intermittently but states he is not in need of anything. Encouraged patient to communicate any needs so that they may be met. Will continue with plan of care.

## 2019-11-12 NOTE — Progress Notes (Signed)
[name] Progress Note Patient Name: Levi Jones DOB: 10/23/1951 MRN: [id number]   Date of Service  11/12/2019  HPI/Events of Note  Patient with acute hypoxemic respiratory failure due to CVID-19 superimposed on a past history of sarcoidosis. He's on high flow at 40 liters and a non re-breather mask.  eICU Interventions  New Pt evaluation completed. Plan is to attempt self-proning with assistance.        Kerry Kass Tameisha Covell 11/12/2019, 9:17 PM

## 2019-11-13 ENCOUNTER — Inpatient Hospital Stay (HOSPITAL_COMMUNITY): Payer: Medicare Other

## 2019-11-13 ENCOUNTER — Inpatient Hospital Stay: Payer: Self-pay

## 2019-11-13 DIAGNOSIS — R7989 Other specified abnormal findings of blood chemistry: Secondary | ICD-10-CM

## 2019-11-13 LAB — GLUCOSE, CAPILLARY
Glucose-Capillary: 203 mg/dL — ABNORMAL HIGH (ref 70–99)
Glucose-Capillary: 227 mg/dL — ABNORMAL HIGH (ref 70–99)
Glucose-Capillary: 217 mg/dL — ABNORMAL HIGH (ref 70–99)
Glucose-Capillary: 169 mg/dL — ABNORMAL HIGH (ref 70–99)

## 2019-11-13 LAB — BASIC METABOLIC PANEL
Anion gap: 11 (ref 5–15)
BUN: 68 mg/dL — ABNORMAL HIGH (ref 8–23)
CO2: 20 mmol/L — ABNORMAL LOW (ref 22–32)
Calcium: 8.1 mg/dL — ABNORMAL LOW (ref 8.9–10.3)
Chloride: 112 mmol/L — ABNORMAL HIGH (ref 98–111)
Creatinine, Ser: 1.44 mg/dL — ABNORMAL HIGH (ref 0.61–1.24)
GFR calc Af Amer: 58 mL/min — ABNORMAL LOW (ref >60)
GFR calc non Af Amer: 50 mL/min — ABNORMAL LOW (ref >60)
Glucose, Bld: 203 mg/dL — ABNORMAL HIGH (ref 70–99)
Potassium: 5.3 mmol/L — ABNORMAL HIGH (ref 3.5–5.1)
Sodium: 143 mmol/L (ref 135–145)

## 2019-11-13 LAB — CBC
HCT: 32.9 % — ABNORMAL LOW (ref 39.0–52.0)
Hemoglobin: 10.1 g/dL — ABNORMAL LOW (ref 13.0–17.0)
MCH: 28.5 pg (ref 26.0–34.0)
MCHC: 30.7 g/dL (ref 30.0–36.0)
MCV: 92.7 fL (ref 80.0–100.0)
Platelets: 131 10*3/uL — ABNORMAL LOW (ref 150–400)
RBC: 3.55 MIL/uL — ABNORMAL LOW (ref 4.22–5.81)
RDW: 15.8 % — ABNORMAL HIGH (ref 11.5–15.5)
WBC: 24.5 10*3/uL — ABNORMAL HIGH (ref 4.0–10.5)
nRBC: 7.8 % — ABNORMAL HIGH (ref 0.0–0.2)

## 2019-11-13 LAB — MAGNESIUM: Magnesium: 2.5 mg/dL — ABNORMAL HIGH (ref 1.7–2.4)

## 2019-11-13 LAB — PROCALCITONIN: Procalcitonin: 0.3 ng/mL

## 2019-11-13 MED ORDER — MORPHINE SULFATE (PF) 2 MG/ML IV SOLN
1.0000 mg | INTRAVENOUS | Status: DC | PRN
  Administered 2019-11-13 – 2019-11-16 (×12): 2 mg via INTRAVENOUS
  Filled 2019-11-13 (×13): qty 1

## 2019-11-13 MED ORDER — ENOXAPARIN SODIUM 120 MG/0.8ML ~~LOC~~ SOLN
1.0000 mg/kg | Freq: Two times a day (BID) | SUBCUTANEOUS | Status: DC
  Administered 2019-11-13: 17:00:00 105 mg via SUBCUTANEOUS
  Filled 2019-11-13 (×3): qty 0.8

## 2019-11-13 MED ORDER — FUROSEMIDE 10 MG/ML IJ SOLN
40.0000 mg | Freq: Two times a day (BID) | INTRAMUSCULAR | Status: DC
  Administered 2019-11-13: 40 mg via INTRAVENOUS
  Filled 2019-11-13: qty 4

## 2019-11-13 NOTE — Progress Notes (Signed)
Rt entered and decreased hfnc to 6lpm with NRB in place and sats remained 100$.  As sson as pt aware Rt in room, he pulled NRB off and dropped to 82%. RT returned flow to 15LPM on HF and asked pt to stop removing NRB.  RT will continue to monitor.

## 2019-11-13 NOTE — TOC Progression Note (Signed)
Transition of Care [facility]) - Progression Note    Patient Details  Name: Levi Jones MRN: [id number] Date of Birth: April 01, 1951  [facility] CM/SW Contact  Loletha Grayer Beverely Pace, RN Phone Number: 11/13/2019, 1:57 PM  Clinical Narrative:   Patient continues to require 100% HFNC/NRB. TOC will continue to monitor for needs as patient medically improves. May He be blessed to do so.           Expected Discharge Plan and Services                                                 Social Determinants of Health (SDOH) Interventions    Readmission Risk Interventions No flowsheet data found.

## 2019-11-13 NOTE — Progress Notes (Signed)
Pt's wife [name] called to unit. Updated of pt condition. [name] appreciates update

## 2019-11-13 NOTE — Progress Notes (Signed)
Pt arrived to room 177 at approx 1845. PICC RN at bedside placing a midline to right upper arm. Pt stable at this time. RT to set up heated high flow per ICU RN.

## 2019-11-13 NOTE — Progress Notes (Signed)
Pt's daughter Benjamine Mola called to unit. Updated of pt condition and plan of care. Elizabeth appreciates update.

## 2019-11-13 NOTE — Progress Notes (Signed)
Lower extremity venous has been completed.   Preliminary results in CV Proc.   Abram Sander 11/13/2019 10:12 AM

## 2019-11-13 NOTE — Progress Notes (Signed)
Pt's daughter Benjamine Mola called to unit. Updated of pt condition. Pt's daughter asking for information about pt's medications. Pt agrees to give information to [place].

## 2019-11-13 NOTE — Progress Notes (Addendum)
Levi Jones  [id number] DOB: 1950/11/08 DOA: 11/27/2019 PCP: Colon Branch, MD    Brief Narrative:  [phone] with a history of sarcoidosis w/ ILD, HTN, HLD, and BPH who was transported to the ED via EMS with 2-3 days of severe fatigue and shortness of breath.  He tested positive for Covid 12/25.  He was admitted after he was found to be in severe hypoxic respiratory failure initially requiring nonrebreather mask as well as heated high flow nasal cannula.  He was dosed with IV steroids, remdesivir, Actemra, and convalescent plasma.  He was transferred to [facility] 1/8 with persisting tenuous pulmonary status.  Significant Events: 12/25 COVID test + 1/5 admit to [facility] via ED 1/8 transfer to [facility] 1/12 transfer to ICU for heated high flow nasal cannula 1/13 coretrak placed - returned to PCU  1/14 pulled out his own coretrak 1/16 PM transfer to ICU in resp distress - trial BIPAP failed > HHFNC   COVID-19 specific Treatment: Remdesivir 1/5 > 1/9 Decadron 1/5 > 1/6 Solu-Medrol 1/6 > 1/14 Actemra 1/5  Antimicrobials:  Unasyn 1/9 > 1/11 Vancomycin 1/11 > 11/12 Cefepime 11/11 > 1/13  Subjective: The patient is awake but appears exhausted.  He will only answer some of my questions.  He denies pain.  He is somewhat tachypneic and appears anxious.  He tells me he does feel short of breath.  Assessment & Plan:  COVID Pneumonia - acute hypoxic respiratory failure Cont to push incentive spirometer and mobility - has completed courses of steroid and remdesivir - mobilize - attempt to diurese as able   Markedly elevated d-dimer  Increased to tx dose anticoag empirically - ruled out DVT w dopplers - do not feel he is stable enough to go for CTa at this time - had a negative CTa chest for PE 11/03/19, but possibility of acute PE persists therefore we will continue full anticoagulation for now  Possible aspiration pneumonia SLP following -core track pulled out by patient - see SLP  note - advanced diet - follow intake - may have to consider replacing C[place]rak this week   Possible CHF - unable to quantify or qualify - unclear diagnosis  Negative approximately 3.1L since admission -monitor ins and outs - EF 55-60% via TTE Sept 2018  Sarcoidosis with pulmonary involvement pathognomonic findings noted on CT chest 11/03/19, and also per report present in 2018 -does not appear the patient was receiving regular follow-up in our pulmonary clinic and he does not appear to have been aware of the severity of his baseline lung disease  Possible underlying liver cirrhosis Noted on CT of chest - no known hx of this   HTN Blood pressure stable   HLD Avoid treatment at present given possible cirrhosis -will need follow-up as outpatient  BPH  Steroid-induced hyperglycemia CBG not significantly elevated at this time  DVT prophylaxis: Lovenox Code Status: DNR - NO CODE BLUE per my discussion w/ him 1/16 Family Communication: spoke w/ his wife via telephone 1/17- informed her of his desire to be NCB Disposition Plan: PCU  Consultants:  PCCM  Objective: Blood pressure (!) 116/51, pulse (!) 114, temperature 98.1 F (36.7 C), temperature source Oral, resp. rate (!) 36, height 6' (1.829 m), weight 106.8 kg, SpO2 100 %.  Intake/Output Summary (Last 24 hours) at 11/13/2019 1351 Last data filed at 11/13/2019 1200 Gross per 24 hour  Intake 360 ml  Output 1075 ml  Net -715 ml   [employer]  10/30/2019 1000 11/02/19 2053 11/13/19 0443  Weight: 115.3 kg 115.3 kg 106.8 kg    Examination: General: high O2 support requirement continues - alert -appears exhausted Lungs: Fine crackles diffusely with poor air movement throughout but no wheezing Cardiovascular: Tachycardic but regular Abdomen: NT/ND, soft, BS positive Extremities: trace B LE edema  CBC: Recent Labs  Lab 11/08/19 0300 11/08/19 0300 11/10/19 0329 11/10/19 0329 11/11/19 0004 11/12/19 0101 11/13/19 0430  WBC  15.9*   < > 18.6*   < > 21.4* 18.2* 24.5*  NEUTROABS 14.7*  --  17.2*  --  19.5*  --   --   HGB 12.8*   < > 12.8*   < > 12.4* 12.0* 10.1*  HCT 41.6   < > 41.5   < > 40.4 39.3 32.9*  MCV 90.8   < > 93.7   < > 91.8 91.8 92.7  PLT 280   < > 166   < > PLATELET CLUMPS NOTED ON SMEAR, UNABLE TO ESTIMATE 124* 131*   < > = values in this interval not displayed.   Basic Metabolic Panel: Recent Labs  Lab 11/10/19 0329 11/10/19 0329 11/11/19 0004 11/12/19 0101 11/13/19 0430  NA 141   < > 142 140 143  K 4.7   < > 4.7 4.2 5.3*  CL 113*   < > 111 110 112*  CO2 16*   < > 20* 21* 20*  GLUCOSE 85   < > 148* 101* 203*  BUN 34*   < > 41* 44* 68*  CREATININE 1.20   < > 1.44* 1.31* 1.44*  CALCIUM 7.9*   < > 8.4* 7.9* 8.1*  MG 2.8*  --  3.0*  --  2.5*   < > = values in this interval not displayed.   GFR: Estimated Creatinine Clearance: 62.9 mL/min (A) (by C-G formula based on SCr of 1.44 mg/dL (H)).  Liver Function Tests: Recent Labs  Lab 11/08/19 0300 11/10/19 0329 11/11/19 0004 11/12/19 0101  AST 32 34 30 31  ALT 51* 37 40 44  ALKPHOS 89 91 104 93  BILITOT 1.3* 1.0 1.0 0.8  PROT 5.8* 5.7* 6.3* 5.8*  ALBUMIN 2.7* 2.8* 3.1* 2.8*    HbA1C: Hgb A1c MFr Bld  Date/Time Value Ref Range Status  09/28/2019 08:58 AM 5.3 4.6 - 6.5 % Final    Comment:    Glycemic Control Guidelines for People with Diabetes:Non Diabetic:  <6%Goal of Therapy: <7%Additional Action Suggested:  >8%   03/04/2017 03:31 PM 5.4 4.6 - 6.5 % Final    Comment:    Glycemic Control Guidelines for People with Diabetes:Non Diabetic:  <6%Goal of Therapy: <7%Additional Action Suggested:  >8%     CBG: Recent Labs  Lab 11/12/19 1122 11/12/19 1704 11/12/19 2232 11/13/19 0920 11/13/19 1203  GLUCAP 125* 164* 250* 203* 227*    Recent Results (from the past 240 hour(s))  MRSA PCR Screening     Status: None   Collection Time: 11/08/19 12:42 PM   Specimen: Nasal Mucosa; Nasopharyngeal  Result Value Ref Range Status    MRSA by PCR NEGATIVE NEGATIVE Final    Comment:        The GeneXpert MRSA Assay (FDA approved for NASAL specimens only), is one component of a comprehensive MRSA colonization surveillance program. It is not intended to diagnose MRSA infection nor to guide or monitor treatment for MRSA infections. Performed at [facility], [address] [place], [place] [address]  Scheduled Meds: . aspirin  81 mg Oral Daily  . chlorhexidine  15 mL Mouth Rinse BID  . Chlorhexidine Gluconate Cloth  6 each Topical Daily  . enoxaparin (LOVENOX) injection  1 mg/kg Subcutaneous Q12H  . feeding supplement (ENSURE ENLIVE)  237 mL Oral TID BM  . insulin aspart  0-15 Units Subcutaneous TID WC  . insulin aspart  0-5 Units Subcutaneous QHS  . insulin detemir  10 Units Subcutaneous QHS  . loratadine  10 mg Oral Daily  . mouth rinse  15 mL Mouth Rinse BID  . tamsulosin  0.4 mg Oral Daily     LOS: 12 days   Cherene Altes, MD [facility] Office  [phone] Pager - Text Page per Amion  If 7PM-7AM, please contact night-coverage per Amion 11/13/2019, 1:51 PM

## 2019-11-14 LAB — PROCALCITONIN: Procalcitonin: 0.26 ng/mL

## 2019-11-14 LAB — GLUCOSE, CAPILLARY
Glucose-Capillary: 181 mg/dL — ABNORMAL HIGH (ref 70–99)
Glucose-Capillary: 261 mg/dL — ABNORMAL HIGH (ref 70–99)
Glucose-Capillary: 170 mg/dL — ABNORMAL HIGH (ref 70–99)
Glucose-Capillary: 178 mg/dL — ABNORMAL HIGH (ref 70–99)

## 2019-11-14 LAB — CBC
HCT: 33.6 % — ABNORMAL LOW (ref 39.0–52.0)
Hemoglobin: 10.5 g/dL — ABNORMAL LOW (ref 13.0–17.0)
MCH: 29.7 pg (ref 26.0–34.0)
MCHC: 31.3 g/dL (ref 30.0–36.0)
MCV: 95.2 fL (ref 80.0–100.0)
Platelets: 131 10*3/uL — ABNORMAL LOW (ref 150–400)
RBC: 3.53 MIL/uL — ABNORMAL LOW (ref 4.22–5.81)
RDW: 18.9 % — ABNORMAL HIGH (ref 11.5–15.5)
WBC: 29.8 10*3/uL — ABNORMAL HIGH (ref 4.0–10.5)
nRBC: 10 % — ABNORMAL HIGH (ref 0.0–0.2)

## 2019-11-14 LAB — RENAL FUNCTION PANEL
Albumin: 3 g/dL — ABNORMAL LOW (ref 3.5–5.0)
Anion gap: 12 (ref 5–15)
BUN: 82 mg/dL — ABNORMAL HIGH (ref 8–23)
CO2: 21 mmol/L — ABNORMAL LOW (ref 22–32)
Calcium: 7.9 mg/dL — ABNORMAL LOW (ref 8.9–10.3)
Chloride: 111 mmol/L (ref 98–111)
Creatinine, Ser: 2.04 mg/dL — ABNORMAL HIGH (ref 0.61–1.24)
GFR calc Af Amer: 38 mL/min — ABNORMAL LOW (ref >60)
GFR calc non Af Amer: 33 mL/min — ABNORMAL LOW (ref >60)
Glucose, Bld: 171 mg/dL — ABNORMAL HIGH (ref 70–99)
Phosphorus: 4.9 mg/dL — ABNORMAL HIGH (ref 2.5–4.6)
Potassium: 4.5 mmol/L (ref 3.5–5.1)
Sodium: 144 mmol/L (ref 135–145)

## 2019-11-14 MED ORDER — ALBUTEROL SULFATE HFA 108 (90 BASE) MCG/ACT IN AERS
2.0000 | INHALATION_SPRAY | RESPIRATORY_TRACT | Status: DC | PRN
  Administered 2019-11-17: 02:00:00 2 via RESPIRATORY_TRACT
  Filled 2019-11-14: qty 6.7

## 2019-11-14 MED ORDER — ENOXAPARIN SODIUM 120 MG/0.8ML ~~LOC~~ SOLN
110.0000 mg | Freq: Two times a day (BID) | SUBCUTANEOUS | Status: DC
  Administered 2019-11-14 – 2019-11-16 (×6): 110 mg via SUBCUTANEOUS
  Filled 2019-11-14 (×8): qty 0.8

## 2019-11-14 MED ORDER — ACETAMINOPHEN 500 MG PO TABS: 500.0000 mg | ORAL_TABLET | Freq: Four times a day (QID) | ORAL | Status: DC | PRN

## 2019-11-14 MED ORDER — LORAZEPAM 0.5 MG PO TABS
0.5000 mg | ORAL_TABLET | Freq: Once | ORAL | Status: AC
  Administered 2019-11-15: 0.5 mg via ORAL
  Filled 2019-11-14: qty 1

## 2019-11-14 MED ORDER — FUROSEMIDE 10 MG/ML IJ SOLN
40.0000 mg | Freq: Two times a day (BID) | INTRAMUSCULAR | Status: DC
  Administered 2019-11-14: 40 mg via INTRAVENOUS
  Filled 2019-11-14: qty 4

## 2019-11-14 NOTE — Progress Notes (Signed)
ANTICOAGULATION CONSULT NOTE - Initial Consult  Pharmacy Consult for Lovenox Indication: high suspicion for acute PE   No Known Allergies  Patient Measurements: Height: 6' (182.9 cm) Weight: 239 lb 13.8 oz (108.8 kg) IBW/kg (Calculated) : 77.6  Vital Signs: Temp: 97.8 F (36.6 C) (01/18 0740) Temp Source: Oral (01/18 0740) BP: 97/55 (01/18 0411) Pulse Rate: 112 (01/18 0413)  Labs: Recent Labs    11/12/19 0101 11/13/19 0430  HGB 12.0* 10.1*  HCT 39.3 32.9*  PLT 124* 131*  CREATININE 1.31* 1.44*    Estimated Creatinine Clearance: 63.4 mL/min (A) (by C-G formula based on SCr of 1.44 mg/dL (H)).  Assessment: 65 YOM presenting with fatigue/SOB, COVID + with D-dimer up to >20 started on full AC.   1/7 CT PE negative but limited study 1/16 D-Dimer up to >20  1/17 [place] neg for DVT   Platelets have trended down to half of admit level. 4T score 4-6 given unclear if has PE but also has clear other cause of COVID. Discussed concern for HIT with MD and that 4T score can also be explain if has large acute PE. MD agreed and will keep HIT in mind but hold off testing now given high suspicion of acute PE.    Goal of Therapy:  Monitor platelets by anticoagulation protocol: Yes   Plan:  Lovenox 1mg /kg (110mg ) SQ every 12 hours Monitor renal function, VTE/PE workup, CBC, s/s bleeding Monitor platelets   Benetta Spar, PharmD, BCPS, BCCP Clinical Pharmacist  Please check AMION for all [place] phone numbers After 10:00 PM, call [place]

## 2019-11-14 NOTE — Progress Notes (Deleted)
Spoke with pt wife [name] she requested pt. Daughter Benjamine Mola number be removed from list to be updated. Stated staff can give limited update but not extensive medical update of pt condition, daughter has called multiple times on each shift and then calls pts very emotional and upset pt. Wife stated that this was the cause of pt pulling out lines and becoming emotional and upset on a previous day. Elizabeth pt. daugther told she can only receive limited information as she was trying to review labs and orders, she became very upset stating this Probation officer is the 3rd nurse to tell her this same information. Gave her contact information for charge and [facility], she stated she will call back tomorrow. Pt. Wife stated that she needs to be notified to give approval for  anyone to be added to pt. Call list.

## 2019-11-14 NOTE — Plan of Care (Signed)
RN updated pt's wife, [name], and daughter, Benjamine Mola, via phone. Pt was able to speak to both. Pt currently on 15L HFNC satting 94%. VSS. Full liquid diet ordered per SPT recommendation. Will continue current POC.   Problem: Health Behavior/Discharge Planning: Goal: Ability to manage health-related needs will improve Outcome: Not Progressing   Problem: Activity: Goal: Risk for activity intolerance will decrease Outcome: Not Progressing   Problem: Nutrition: Goal: Adequate nutrition will be maintained Outcome: Not Progressing   Problem: Elimination: Goal: Will not experience complications related to bowel motility Outcome: Not Progressing   Problem: Education: Goal: Knowledge of General Education information will improve Description: Including pain rating scale, medication(s)/side effects and non-pharmacologic comfort measures Outcome: Progressing   Problem: Clinical Measurements: Goal: Ability to maintain clinical measurements within normal limits will improve Outcome: Progressing Goal: Will remain free from infection Outcome: Progressing Goal: Diagnostic test results will improve Outcome: Progressing Goal: Respiratory complications will improve Outcome: Progressing Goal: Cardiovascular complication will be avoided Outcome: Progressing   Problem: Coping: Goal: Level of anxiety will decrease Outcome: Progressing   Problem: Elimination: Goal: Will not experience complications related to urinary retention Outcome: Progressing   Problem: Pain Managment: Goal: General experience of comfort will improve Outcome: Progressing   Problem: Safety: Goal: Ability to remain free from injury will improve Outcome: Progressing   Problem: Skin Integrity: Goal: Risk for impaired skin integrity will decrease Outcome: Progressing   Problem: Education: Goal: Knowledge of risk factors and measures for prevention of condition will improve Outcome: Progressing   Problem:  Coping: Goal: Psychosocial and spiritual needs will be supported Outcome: Progressing   Problem: Respiratory: Goal: Will maintain a patent airway Outcome: Progressing Goal: Complications related to the disease process, condition or treatment will be avoided or minimized Outcome: Progressing

## 2019-11-14 NOTE — Progress Notes (Signed)
Spoke with pt wife [name] she requested pt. Daughter Benjamine Mola number be removed from list to be updated. Stated staff can give limited update but not extensive medical update of pt condition, daughter has called multiple times on each shift and then calls pts very emotional and upset pt. Wife stated that this was the cause of pt pulling out lines and becoming emotional and upset on a previous day. Elizabeth pt. daugther told she can only receive limited information as she was trying to review labs and orders, she became very upset stating this Probation officer is the 3rd nurse to tell her this same information. Gave her contact information for charge and [facility], she stated she will call back tomorrow. Pt. Wife stated that she needs to be notified to give approval for  anyone to be added to pt. Call list.

## 2019-11-14 NOTE — Progress Notes (Deleted)
Spoke with pt wife [name] she requested pt. Daughter Benjamine Mola number be removed from list to be updated. Stated staff can give limited update but not extensive medical update of pt condition, daughter has called multiple times on each shift and then calls pts very emotional and upset pt. Wife stated that this was the cause of pt pulling out lines and becoming emotional and upset on a previous day. Elizabeth pt. daugther told she can only receive limited information as she was trying to review labs and orders, she became very upset stating this Probation officer is the 3rd nurse to tell her this same information. Gave her contact information for charge and [facility], she stated she will call back tomorrow. Pt. Wife stated that she needs to be notified to give approval for  anyone to be added to pt. Call list.

## 2019-11-14 NOTE — Progress Notes (Signed)
Levi Jones  [id number] DOB: [birth year] DOA: 10/28/2019 PCP: Colon Branch, MD    Brief Narrative:  [phone] with a history of sarcoidosis w/ ILD, HTN, HLD, and BPH who was transported to the ED via EMS with 2-3 days of severe fatigue and shortness of breath.  He tested positive for Covid 12/25.  He was admitted after he was found to be in severe hypoxic respiratory failure initially requiring nonrebreather mask as well as heated high flow nasal cannula.  He was dosed with IV steroids, remdesivir, Actemra, and convalescent plasma.  He was transferred to [facility] 1/8 with persisting tenuous pulmonary status.  Significant Events: 12/25 COVID test + 1/5 admit to [facility] via ED 1/8 transfer to [facility] 1/12 transfer to ICU for heated high flow nasal cannula 1/13 coretrak placed - returned to PCU  1/14 pulled out his own coretrak 1/16 PM transfer to ICU in resp distress - trial BIPAP failed > HHFNC   COVID-19 specific Treatment: Remdesivir 1/5 > 1/9 Decadron 1/5 > 1/6 Solu-Medrol 1/6 > 1/14 Actemra 1/5  Antimicrobials:  Unasyn 1/9 > 1/11 Vancomycin 1/11 > 11/12 Cefepime 11/11 > 1/13  Subjective: Presently requiring high flow nasal cannula at 15 L but with saturations in the mid 90s.  Sinus tachycardia persists.  Discussed the patient's care with his RN who reports that he responds well to small morphine doses to assist with his air hunger.  Assessment & Plan:  COVID Pneumonia - acute hypoxic respiratory failure Cont to push incentive spirometer and mobility - has completed courses of steroid and remdesivir - mobilize - attempt to diurese as able   Markedly elevated d-dimer  Increased to tx dose anticoag empirically - ruled out DVT w dopplers - do not feel he is stable enough to go for CTa at this time - had a negative CTa chest for PE 11/03/19, but possibility of acute PE persists therefore we will continue full anticoagulation for now  Possible aspiration pneumonia SLP  following -core track pulled out by patient - see SLP note - advanced diet - follow intake - do not think he would tolerate a feeding tube at this time   Possible CHF - unable to quantify or qualify - unclear diagnosis  Negative approximately 3.7L since admission -monitor ins and outs - EF 55-60% via TTE Sept 2018  Sarcoidosis with pulmonary involvement pathognomonic findings noted on CT chest 11/03/19, and also per report present in 2018 -does not appear the patient was receiving regular follow-up in a pulmonary clinic and he does not appear to have been aware of the severity of his baseline lung disease  Possible underlying liver cirrhosis Noted on CT of chest - no known hx of this   HTN Blood pressure stable   HLD Avoid treatment at present given possible cirrhosis -will need follow-up as outpatient  BPH  Steroid-induced hyperglycemia CBG not significantly elevated at this time  DVT prophylaxis: Lovenox Code Status: DNR - NO CODE BLUE per my discussion w/ him 1/16 Family Communication: spoke w/ his wife via telephone 1/17- informed her of his desire to be NCB Disposition Plan: PCU  Consultants:  PCCM  Objective: Blood pressure (!) 97/55, pulse (!) 109, temperature 97.8 F (36.6 C), temperature source Oral, resp. rate 20, height 6' (1.829 m), weight 108.8 kg, SpO2 90 %.  Intake/Output Summary (Last 24 hours) at 11/14/2019 1036 Last data filed at 11/14/2019 0555 Gross per 24 hour  Intake 485 ml  Output 900 ml  Net -415 ml   [employer]   11/02/19 2053 11/13/19 0443 11/14/19 0425  Weight: 115.3 kg 106.8 kg 108.8 kg    Examination: General: high O2 support requirement continues Lungs: Fine crackles diffusely with poor air movement throughout but no wheezing Cardiovascular: Tachycardic but regular Abdomen: NT/ND, soft, BS positive Extremities: trace B LE edema w/o change   CBC: Recent Labs  Lab 11/08/19 0300 11/08/19 0300 11/10/19 0329 11/10/19 0329  11/11/19 0004 11/12/19 0101 11/13/19 0430  WBC 15.9*   < > 18.6*   < > 21.4* 18.2* 24.5*  NEUTROABS 14.7*  --  17.2*  --  19.5*  --   --   HGB 12.8*   < > 12.8*   < > 12.4* 12.0* 10.1*  HCT 41.6   < > 41.5   < > 40.4 39.3 32.9*  MCV 90.8   < > 93.7   < > 91.8 91.8 92.7  PLT 280   < > 166   < > PLATELET CLUMPS NOTED ON SMEAR, UNABLE TO ESTIMATE 124* 131*   < > = values in this interval not displayed.   Basic Metabolic Panel: Recent Labs  Lab 11/10/19 0329 11/10/19 0329 11/11/19 0004 11/12/19 0101 11/13/19 0430  NA 141   < > 142 140 143  K 4.7   < > 4.7 4.2 5.3*  CL 113*   < > 111 110 112*  CO2 16*   < > 20* 21* 20*  GLUCOSE 85   < > 148* 101* 203*  BUN 34*   < > 41* 44* 68*  CREATININE 1.20   < > 1.44* 1.31* 1.44*  CALCIUM 7.9*   < > 8.4* 7.9* 8.1*  MG 2.8*  --  3.0*  --  2.5*   < > = values in this interval not displayed.   GFR: Estimated Creatinine Clearance: 63.4 mL/min (A) (by C-G formula based on SCr of 1.44 mg/dL (H)).  Liver Function Tests: Recent Labs  Lab 11/08/19 0300 11/10/19 0329 11/11/19 0004 11/12/19 0101  AST 32 34 30 31  ALT 51* 37 40 44  ALKPHOS 89 91 104 93  BILITOT 1.3* 1.0 1.0 0.8  PROT 5.8* 5.7* 6.3* 5.8*  ALBUMIN 2.7* 2.8* 3.1* 2.8*    HbA1C: Hgb A1c MFr Bld  Date/Time Value Ref Range Status  09/28/2019 08:58 AM 5.3 4.6 - 6.5 % Final    Comment:    Glycemic Control Guidelines for People with Diabetes:Non Diabetic:  <6%Goal of Therapy: <7%Additional Action Suggested:  >8%   03/04/2017 03:31 PM 5.4 4.6 - 6.5 % Final    Comment:    Glycemic Control Guidelines for People with Diabetes:Non Diabetic:  <6%Goal of Therapy: <7%Additional Action Suggested:  >8%     CBG: Recent Labs  Lab 11/13/19 0920 11/13/19 1203 11/13/19 1650 11/13/19 2049 11/14/19 0743  GLUCAP 203* 227* 217* 169* 181*    Recent Results (from the past 240 hour(s))  MRSA PCR Screening     Status: None   Collection Time: 11/08/19 12:42 PM   Specimen: Nasal Mucosa;  Nasopharyngeal  Result Value Ref Range Status   MRSA by PCR NEGATIVE NEGATIVE Final    Comment:        The GeneXpert MRSA Assay (FDA approved for NASAL specimens only), is one component of a comprehensive MRSA colonization surveillance program. It is not intended to diagnose MRSA infection nor to guide or monitor treatment for MRSA infections. Performed at [facility], [address]  [place], [place] [address]      Scheduled Meds: . aspirin  81 mg Oral Daily  . chlorhexidine  15 mL Mouth Rinse BID  . Chlorhexidine Gluconate Cloth  6 each Topical Daily  . enoxaparin (LOVENOX) injection  110 mg Subcutaneous Q12H  . feeding supplement (ENSURE ENLIVE)  237 mL Oral TID BM  . furosemide  40 mg Intravenous Q12H  . insulin aspart  0-15 Units Subcutaneous TID WC  . insulin aspart  0-5 Units Subcutaneous QHS  . insulin detemir  10 Units Subcutaneous QHS  . loratadine  10 mg Oral Daily  . mouth rinse  15 mL Mouth Rinse BID  . tamsulosin  0.4 mg Oral Daily     LOS: 13 days   Cherene Altes, MD [facility] Office  [phone] Pager - Text Page per Amion  If 7PM-7AM, please contact night-coverage per Amion 11/14/2019, 10:36 AM

## 2019-11-14 NOTE — Progress Notes (Signed)
  Speech Language Pathology Treatment: Dysphagia  Patient Details Name: DUWAINE JASMIN MRN: [id number] DOB: 21-Jan-1951 Today's Date: 11/14/2019 Time: 1212-1223 SLP Time Calculation (min) (ACUTE ONLY): 11 min  Assessment / Plan / Recommendation Clinical Impression  It appears pt has declined since previous session 1/15. He is on 15L HFNC and non rebreather sitting up in bed with eyes closed, dyspneic SpO2 fluctuating lower 80"s-91%.. He will respond to questions, stated "I don't know" when asked preferences re: food/liquids. He is on Dys 3 and is apparent he won't have the stamina or respiratory support to masticate even softer textures at present. Orally propelled applesauce and small sips water unremarkably. Aspiration risk is high and will downgrade diet to full liquids to receive pudding, applesauce, grits, jello requiring minimal oral manipulation and effort. Continue thin liquids. Discussed with MD and instrumental testing of swallow does not appear appropriate at this time. Continue aspiration precautions and allowing rest breaks. ST will sign off currently and if status changes can reconsult (or if focus moves toward comfort, could allow specific food if he has preferences (more regular texture) if he requests.      HPI HPI: 69 year old male with history of Sarcoidosis, HTN, HLD and BPH brought to ED by EMS due to worsening fatigue and shortness of breath for 2 to 3 days. Tested positive for COVID-19 on 12/25, he was admitted with severe hypoxic respiratory failure requiring nonrebreather mask and then 35 to 40 L of heated high flow oxygen.  He was treated with full extent of treatment including IV steroids, remdesivir, convalescent plasma and Actemra.  Transferred to [facility] for further care on 11/05/19.       SLP Plan  Discharge SLP treatment due to (comment)       Recommendations  Diet recommendations: Thin liquid;Other(comment)(full liquids) Liquids provided via: Cup;Straw Medication  Administration: Crushed with puree Supervision: Staff to assist with self feeding;Full supervision/cueing for compensatory strategies Compensations: Slow rate;Small sips/bites;Other (Comment)(breaks for respiration; rest) Postural Changes and/or Swallow Maneuvers: Seated upright 90 degrees                Oral Care Recommendations: Oral care BID Follow up Recommendations: None SLP Visit Diagnosis: Dysphagia, unspecified (R13.10) Plan: Discharge SLP treatment due to (comment)       [place], Rilley Stash Willis 11/14/2019, 12:31 PM  [phone]

## 2019-11-15 DIAGNOSIS — Z515 Encounter for palliative care: Secondary | ICD-10-CM

## 2019-11-15 DIAGNOSIS — Z7189 Other specified counseling: Secondary | ICD-10-CM

## 2019-11-15 LAB — CBC
HCT: 32 % — ABNORMAL LOW (ref 39.0–52.0)
Hemoglobin: 9.7 g/dL — ABNORMAL LOW (ref 13.0–17.0)
MCH: 29.8 pg (ref 26.0–34.0)
MCHC: 30.3 g/dL (ref 30.0–36.0)
MCV: 98.2 fL (ref 80.0–100.0)
Platelets: 117 10*3/uL — ABNORMAL LOW (ref 150–400)
RBC: 3.26 MIL/uL — ABNORMAL LOW (ref 4.22–5.81)
RDW: 20.6 % — ABNORMAL HIGH (ref 11.5–15.5)
WBC: 30.7 10*3/uL — ABNORMAL HIGH (ref 4.0–10.5)
nRBC: 17.4 % — ABNORMAL HIGH (ref 0.0–0.2)

## 2019-11-15 LAB — COMPREHENSIVE METABOLIC PANEL
ALT: 48 U/L — ABNORMAL HIGH (ref 0–44)
AST: 50 U/L — ABNORMAL HIGH (ref 15–41)
Albumin: 3 g/dL — ABNORMAL LOW (ref 3.5–5.0)
Alkaline Phosphatase: 97 U/L (ref 38–126)
Anion gap: 12 (ref 5–15)
BUN: 78 mg/dL — ABNORMAL HIGH (ref 8–23)
CO2: 20 mmol/L — ABNORMAL LOW (ref 22–32)
Calcium: 8 mg/dL — ABNORMAL LOW (ref 8.9–10.3)
Chloride: 114 mmol/L — ABNORMAL HIGH (ref 98–111)
Creatinine, Ser: 1.67 mg/dL — ABNORMAL HIGH (ref 0.61–1.24)
GFR calc Af Amer: 48 mL/min — ABNORMAL LOW (ref >60)
GFR calc non Af Amer: 42 mL/min — ABNORMAL LOW (ref >60)
Glucose, Bld: 172 mg/dL — ABNORMAL HIGH (ref 70–99)
Potassium: 4.3 mmol/L (ref 3.5–5.1)
Sodium: 146 mmol/L — ABNORMAL HIGH (ref 135–145)
Total Bilirubin: 1.1 mg/dL (ref 0.3–1.2)
Total Protein: 5.9 g/dL — ABNORMAL LOW (ref 6.5–8.1)

## 2019-11-15 LAB — GLUCOSE, CAPILLARY
Glucose-Capillary: 172 mg/dL — ABNORMAL HIGH (ref 70–99)
Glucose-Capillary: 231 mg/dL — ABNORMAL HIGH (ref 70–99)
Glucose-Capillary: 236 mg/dL — ABNORMAL HIGH (ref 70–99)
Glucose-Capillary: 304 mg/dL — ABNORMAL HIGH (ref 70–99)

## 2019-11-15 LAB — AMMONIA: Ammonia: 58 umol/L — ABNORMAL HIGH (ref 9–35)

## 2019-11-15 MED ORDER — DEXTROSE 5 % IV SOLN: INTRAVENOUS | Status: DC

## 2019-11-15 NOTE — Plan of Care (Signed)
RN updated wife, [name], via phone. Pt currently in restraints for pulling off NRB mask which he needs. SBP soft in low 100's, O2 drops to 76% on 15L HFNC alone. Pt was fed breakfast by RN, tolerated well. Will continue current POC.  Problem: Education: Goal: Knowledge of General Education information will improve Description: Including pain rating scale, medication(s)/side effects and non-pharmacologic comfort measures Outcome: Progressing   Problem: Health Behavior/Discharge Planning: Goal: Ability to manage health-related needs will improve Outcome: Progressing   Problem: Clinical Measurements: Goal: Diagnostic test results will improve Outcome: Progressing Goal: Cardiovascular complication will be avoided Outcome: Progressing   Problem: Nutrition: Goal: Adequate nutrition will be maintained Outcome: Progressing   Problem: Coping: Goal: Level of anxiety will decrease Outcome: Progressing   Problem: Elimination: Goal: Will not experience complications related to urinary retention Outcome: Progressing   Problem: Pain Managment: Goal: General experience of comfort will improve Outcome: Progressing   Problem: Skin Integrity: Goal: Risk for impaired skin integrity will decrease Outcome: Progressing   Problem: Education: Goal: Knowledge of risk factors and measures for prevention of condition will improve Outcome: Progressing   Problem: Coping: Goal: Psychosocial and spiritual needs will be supported Outcome: Progressing   Problem: Respiratory: Goal: Will maintain a patent airway Outcome: Progressing Goal: Complications related to the disease process, condition or treatment will be avoided or minimized Outcome: Progressing   Problem: Clinical Measurements: Goal: Ability to maintain clinical measurements within normal limits will improve Outcome: Not Progressing Goal: Will remain free from infection Outcome: Not Progressing Goal: Respiratory complications will  improve Outcome: Not Progressing   Problem: Activity: Goal: Risk for activity intolerance will decrease Outcome: Not Progressing   Problem: Elimination: Goal: Will not experience complications related to bowel motility Outcome: Not Progressing   Problem: Safety: Goal: Ability to remain free from injury will improve Outcome: Not Progressing

## 2019-11-15 NOTE — Progress Notes (Signed)
Levi Jones  [id number] DOB: Aug 01, 1951 DOA: 11/18/2019 PCP: Colon Branch, MD    Brief Narrative:  [phone] with a history of sarcoidosis w/ ILD, HTN, HLD, and BPH who was transported to the ED via EMS with 2-3 days of severe fatigue and shortness of breath.  He tested positive for Covid 12/25.  He was admitted after he was found to be in severe hypoxic respiratory failure initially requiring nonrebreather mask as well as heated high flow nasal cannula.  He was dosed with IV steroids, remdesivir, Actemra, and convalescent plasma.  He was transferred to [facility] 1/8 with persisting tenuous pulmonary status.  Significant Events: 12/25 COVID test + 1/5 admit to [facility] via ED 1/8 transfer to [facility] 1/12 transfer to ICU for heated high flow nasal cannula 1/13 coretrak placed - returned to PCU  1/14 pulled out his own coretrak 1/16 PM transfer to ICU in resp distress - trial BIPAP failed > HHFNC   COVID-19 specific Treatment: Remdesivir 1/5 > 1/9 Decadron 1/5 > 1/6 Solu-Medrol 1/6 > 1/14 Actemra 1/5  Antimicrobials:  Unasyn 1/9 > 1/11 Vancomycin 1/11 > 11/12 Cefepime 11/11 > 1/13  Subjective: On max HFNC support, as well as NRB mask. Sats in 90-95% range.  Mildly confused at time of my exam.  Does not appear uncomfortable.  Cannot provide a reliable history.  Assessment & Plan:  COVID Pneumonia - acute hypoxic respiratory failure Cont to push incentive spirometer and mobility - has completed courses of steroid and remdesivir - mobilize - holding diuresis due to clinical DH/pre-renal azotemia   Markedly elevated d-dimer  Increased to tx dose anticoag empirically - ruled out DVT w dopplers - do not feel he is stable enough to go for CTa at this time - had a negative CTa chest for PE 11/03/19, but possibility of acute PE persists therefore we will continue full anticoagulation for now  Acute Kidney Injury  Likely due to pre-renal azotemia - pt eating and drinking very  little, but did not tolerate feeding tube - crt improved today   Hypernatremia  Due to dehydration - attempt to gently volume expand and follow   Possible aspiration pneumonia SLP following -core track pulled out by patient - see SLP note - advanced diet - follow intake - do not think he would tolerate a feeding tube at this time   Possible CHF - unable to quantify or qualify - unclear diagnosis  Negative approximately 4.2L since admission -monitor ins and outs - EF 55-60% via TTE Sept 2018  Sarcoidosis with pulmonary involvement pathognomonic findings noted on CT chest 11/03/19, and also per report present in 2018 -does not appear the patient was receiving regular follow-up in a pulmonary clinic and he does not appear to have been aware of the severity of his baseline lung disease  Possible underlying liver cirrhosis Noted on CT of chest - no known hx of this   HTN Blood pressure stable   HLD Avoid treatment at present given possible cirrhosis -will need follow-up as outpatient  BPH  Steroid-induced hyperglycemia CBG not significantly elevated at this time  DVT prophylaxis: Lovenox Code Status: DNR - NO CODE BLUE per my discussion w/ him 1/16 Family Communication: spoke w/ his wife via telephone 1/17- informed her of his desire to be NCB Disposition Plan: PCU  Consultants:  PCCM  Objective: Blood pressure 119/66, pulse 99, temperature 99 F (37.2 C), temperature source Axillary, resp. rate (!) 23, height 6' (1.829 m), weight  108.8 kg, SpO2 97 %.  Intake/Output Summary (Last 24 hours) at 11/15/2019 1009 Last data filed at 11/15/2019 0918 Gross per 24 hour  Intake 1560 ml  Output 2075 ml  Net -515 ml   Filed Weights   11/02/19 2053 11/13/19 0443 11/14/19 0425  Weight: 115.3 kg 106.8 kg 108.8 kg    Examination: General: high O2 support requirement continues  Lungs: Fine crackles diffusely w/o change - no wheezing  Cardiovascular: Tachycardic but regular - no rub    Abdomen: NT/ND, soft, BS positive Extremities: trace B LE edema   CBC: Recent Labs  Lab 11/10/19 0329 11/10/19 0329 11/11/19 0004 11/12/19 0101 11/13/19 0430 11/14/19 0931 11/15/19 0605  WBC 18.6*   < > 21.4*   < > 24.5* 29.8* 30.7*  NEUTROABS 17.2*  --  19.5*  --   --   --   --   HGB 12.8*   < > 12.4*   < > 10.1* 10.5* 9.7*  HCT 41.5   < > 40.4   < > 32.9* 33.6* 32.0*  MCV 93.7   < > 91.8   < > 92.7 95.2 98.2  PLT 166   < > PLATELET CLUMPS NOTED ON SMEAR, UNABLE TO ESTIMATE   < > 131* 131* 117*   < > = values in this interval not displayed.   Basic Metabolic Panel: Recent Labs  Lab 11/10/19 0329 11/10/19 0329 11/11/19 0004 11/12/19 0101 11/13/19 0430 11/14/19 0931 11/15/19 0605  NA 141   < > 142   < > 143 144 146*  K 4.7   < > 4.7   < > 5.3* 4.5 4.3  CL 113*   < > 111   < > 112* 111 114*  CO2 16*   < > 20*   < > 20* 21* 20*  GLUCOSE 85   < > 148*   < > 203* 171* 172*  BUN 34*   < > 41*   < > 68* 82* 78*  CREATININE 1.20   < > 1.44*   < > 1.44* 2.04* 1.67*  CALCIUM 7.9*   < > 8.4*   < > 8.1* 7.9* 8.0*  MG 2.8*  --  3.0*  --  2.5*  --   --   PHOS  --   --   --   --   --  4.9*  --    < > = values in this interval not displayed.   GFR: Estimated Creatinine Clearance: 54.7 mL/min (A) (by C-G formula based on SCr of 1.67 mg/dL (H)).  Liver Function Tests: Recent Labs  Lab 11/10/19 0329 11/10/19 0329 11/11/19 0004 11/12/19 0101 11/14/19 0931 11/15/19 0605  AST 34  --  30 31  --  50*  ALT 37  --  40 44  --  48*  ALKPHOS 91  --  104 93  --  97  BILITOT 1.0  --  1.0 0.8  --  1.1  PROT 5.7*  --  6.3* 5.8*  --  5.9*  ALBUMIN 2.8*   < > 3.1* 2.8* 3.0* 3.0*   < > = values in this interval not displayed.    HbA1C: Hgb A1c MFr Bld  Date/Time Value Ref Range Status  09/28/2019 08:58 AM 5.3 4.6 - 6.5 % Final    Comment:    Glycemic Control Guidelines for People with Diabetes:Non Diabetic:  <6%Goal of Therapy: <7%Additional Action Suggested:  >8%   03/04/2017  03:31 PM 5.4 4.6 -  6.5 % Final    Comment:    Glycemic Control Guidelines for People with Diabetes:Non Diabetic:  <6%Goal of Therapy: <7%Additional Action Suggested:  >8%     CBG: Recent Labs  Lab 11/14/19 0743 11/14/19 1134 11/14/19 1710 11/14/19 2135 11/15/19 0807  GLUCAP 181* 261* 170* 178* 172*    Recent Results (from the past 240 hour(s))  MRSA PCR Screening     Status: None   Collection Time: 11/08/19 12:42 PM   Specimen: Nasal Mucosa; Nasopharyngeal  Result Value Ref Range Status   MRSA by PCR NEGATIVE NEGATIVE Final    Comment:        The GeneXpert MRSA Assay (FDA approved for NASAL specimens only), is one component of a comprehensive MRSA colonization surveillance program. It is not intended to diagnose MRSA infection nor to guide or monitor treatment for MRSA infections. Performed at [facility], [place] [address], [place], [place] [address]      Scheduled Meds: . aspirin  81 mg Oral Daily  . chlorhexidine  15 mL Mouth Rinse BID  . Chlorhexidine Gluconate Cloth  6 each Topical Daily  . enoxaparin (LOVENOX) injection  110 mg Subcutaneous Q12H  . feeding supplement (ENSURE ENLIVE)  237 mL Oral TID BM  . insulin aspart  0-15 Units Subcutaneous TID WC  . insulin aspart  0-5 Units Subcutaneous QHS  . insulin detemir  10 Units Subcutaneous QHS  . loratadine  10 mg Oral Daily  . mouth rinse  15 mL Mouth Rinse BID  . tamsulosin  0.4 mg Oral Daily     LOS: 14 days   Cherene Altes, MD [facility] Office  [phone] Pager - Text Page per Amion  If 7PM-7AM, please contact night-coverage per Amion 11/15/2019, 10:09 AM

## 2019-11-15 NOTE — Progress Notes (Signed)
Pt wife called back this am to check on pt while we were talking, monitoring called pt O2 levels back at 35% wife overheard, told me to call her back in room with pt for 30 plus minutes called wife back a few minutes into call monitoring called back pt had again removed O2, this has been ongoing throughout the shift. Wife notified prior to restraints being placed that this was a option as pt has been medicated multiple times tonight. Wife thanked me and stated she understood and was okay to have restraints placed. MD notified and order given. Pt placed in bilateral wrist restraints and he stated understanding as to why and that once O2 level stabilize and he is  no longer attempting to remove NRB and HFNC restraints will be removed. Called wife back and she was thankful and understanding of restraint placement.

## 2019-11-15 NOTE — Progress Notes (Signed)
RN called daughter Benjamine Mola and allowed her to speak with her father. Brief update given pertaining to pt intake, current O2 needs, and plan of care.

## 2019-11-15 NOTE — Progress Notes (Signed)
Physical Therapy Treatment Patient Details Name: Levi Jones MRN: [id number] DOB: 07-Jan-1951 Today's Date: 11/15/2019    History of Present Illness 69 year old male patient admitted with COVID/acute respiratory failure; PMH includes Sarcoidosis,HTN, HLD, and BPH- condition (per chart) noted to be tenuous. Cleared with RN to perform PT evaluation    PT Comments    Pt had been pulling to get NRB off this am and thus was placed in restraints. Upon therapist arrival to room pt verbalized that he would not pull at NRB or nasal canula, and he did not during entire session. Was only able to work on sitting edge of bed and working on pursed lip breathing. On 15LHFNC and 15L NRB with supine to sit pt desat to 73% for approx 10 mins pt was able to sit edge of bed and attempt to work on pursed lip breathing, noted that RR increased to 40s with pt taking short shallow breaths, with some cues pt minimally able to reduce this to range between 20s-30s.Pt also noted to be quite erratic while sitting attempting to tripod, bracing with UE when asked if he was in distress he stated he was just trying to bring his breathing rate back down. As pt was having difficulty with returning sats to 90s on max 02 did not attempt to proceed past sitting edge of bed. Once pt returned to supine and able to relax 02 saturations returned to 90s with RR in low 20s.Pt may greatly benefit from post acute care level rehab at dc from hospital.    Follow Up Recommendations  SNF;CIR     Equipment Recommendations       Recommendations for Other Services       Precautions / Restrictions Precautions Precautions: Fall Precaution Comments: 02 sats drop w/ min effort Restrictions Weight Bearing Restrictions: No    Mobility  Bed Mobility Overal bed mobility: Needs Assistance Bed Mobility: Supine to Sit;Sit to Supine Rolling: Min assist   Supine to sit: Mod assist Sit to supine: Mod assist   General bed mobility comments:  was only able to get to edge of bed and work on pursed lip breathing this session  Transfers                    Ambulation/Gait                 Stairs             Wheelchair Mobility    Modified Rankin (Stroke Patients Only)       Balance Overall balance assessment: Needs assistance Sitting-balance support: Feet supported;Bilateral upper extremity supported Sitting balance-Leahy Scale: Poor                                      Cognition Arousal/Alertness: Lethargic Behavior During Therapy: Agitated;Anxious Overall Cognitive Status: Impaired/Different from baseline Area of Impairment: Attention;Memory;Following commands;Safety/judgement;Awareness;Problem solving                   Current Attention Level: Divided Memory: Decreased recall of precautions;Decreased short-term memory Following Commands: Follows one step commands inconsistently Safety/Judgement: Decreased awareness of safety;Decreased awareness of deficits   Problem Solving: Slow processing;Decreased initiation;Difficulty sequencing;Requires verbal cues;Requires tactile cues        Exercises      General Comments        Pertinent Vitals/Pain Pain Assessment: No/denies pain(but noted to be grimacing and  grunting throughout)    Home Living                      Prior Function            PT Goals (current goals can now be found in the care plan section) Acute Rehab PT Goals Patient Stated Goal: states wants to get up and walking PT Goal Formulation: With patient Time For Goal Achievement: 11/20/19 Potential to Achieve Goals: Fair Progress towards PT goals: Not progressing toward goals - comment(had decline over weekend and was in ICU)    Frequency    Min 3X/week      PT Plan Discharge plan needs to be updated    Co-evaluation              AM-PAC PT "6 Clicks" Mobility   Outcome Measure  Help needed turning from your back to  your side while in a flat bed without using bedrails?: A Little Help needed moving from lying on your back to sitting on the side of a flat bed without using bedrails?: A Little Help needed moving to and from a bed to a chair (including a wheelchair)?: A Lot Help needed standing up from a chair using your arms (e.g., wheelchair or bedside chair)?: A Lot Help needed to walk in hospital room?: A Lot Help needed climbing 3-5 steps with a railing? : Total 6 Click Score: 13    End of Session Equipment Utilized During Treatment: Oxygen Activity Tolerance: Patient limited by fatigue;Patient limited by lethargy;Treatment limited secondary to medical complications (Comment) Patient left: in bed;with call bell/phone within reach;with bed alarm set;with restraints reapplied Nurse Communication: Mobility status PT Visit Diagnosis: Muscle weakness (generalized) (M62.81)     Time: [id number] PT Time Calculation (min) (ACUTE ONLY): 24 min  Charges:  $Therapeutic Activity: 23-37 mins                     Horald Chestnut, PT    Delford Field 11/15/2019, 4:54 PM

## 2019-11-15 NOTE — Plan of Care (Signed)
RN updated pt's daughter, Benjamine Mola, on current condition and plan of care. Daughter specifically requested VS. RN informed pt's daughter that she was busy with another patient and unable to give that information at that time. Daughter verbally aggressive and asked for CN. CN made aware of situation. Pt in bed on 15L HFNC and NRB. VSS. PRN morphine given for dyspnea.

## 2019-11-15 NOTE — Consult Note (Addendum)
Consultation Note Date: 11/15/2019   Patient Name: Levi Jones  DOB: 07-24-1951  MRN: [id number]  Age / Sex: 69 y.o., male  PCP: Colon Branch, MD Referring Physician: Cherene Altes, MD  Reason for Consultation: Establishing goals of care  HPI/Patient Profile: 69 y.o. male  with past medical history of sarcoidosis, hypertension, hyperlipidemia, BPH admitted on 11/12/2019 with generalized fatigue and shortness of breath related to + COVID pneumonia. He has required HFNC up to 35-40L + NRB and has received IV steroids, remdesivir, convalescent plasma and Actemra. He continues with ongoing dyspnea and high oxygen needs with possible PE and possible aspiration pneumonia.   Clinical Assessment and Goals of Care: I have reviewed records and I have spoken with bedside RN Ramona and Dr. Thereasa Solo. He is currently requiring 15L nasal cannula along with NRB mask and still struggling with hypoxia and dyspnea at times. He does respond well and have relief with small doses of morphine for dyspnea. He is eating with assistance and had grits, applesauce, and ice cream for breakfast and appetite seems good but limited by breathing difficulty. He continues to be fatigued and anxious. He is also requiring restraints as he pulls off oxygen at times. Mentation and orientation fluctuates (likely due to hypoxia and medication effect).   I spoke with wife, [name]. I explained that we are concerned given Mr. Lassila continued struggle with his oxygenation and breathing. We discussed that he has very little reserve in his body and this is even impacting his intake and eating. I broached the concern that he could continue to worsen and what this would mean. [name] shares with me that she respects his decision for DNR but that she feels in her heart that he will ultimately improve from this. They have strong faith and she is praying for  a miracle. She is very appreciative of the care that he is receiving and the staff are providing and wishes to continue to support him with aggressive care with hopes of improvement. Emotional support provided.   Primary Decision Maker NEXT OF KIN wife    SUMMARY OF RECOMMENDATIONS   - DNR previously established - Continue with otherwise aggressive care with hope of improvement  Code Status/Advance Care Planning:  DNR - decided previously   Symptom Management:   Agree with low dose morphine for dyspnea.   Palliative Prophylaxis:   Aspiration, Bowel Regimen, Delirium Protocol, Frequent Pain Assessment, Oral Care and Turn Reposition  Psycho-social/Spiritual:   Desire for further Chaplaincy support:no  Additional Recommendations: Caregiving  Support/Resources  Prognosis:   Overall prognosis guarded with underlying sarcoidosis, COVID pneumonia, and ongoing respiratory failure.   Discharge Planning: To Be Determined      Primary Diagnoses: Present on Admission: . Acute respiratory failure due to COVID-19 ([place]) . BPH (benign prostatic hyperplasia) . Essential hypertension . Hyperlipidemia . Sarcoidosis   I have reviewed the medical record, interviewed the patient and family, and examined the patient. The following aspects are pertinent.  Past Medical History:  Diagnosis Date  .  Abnormal LFTs 07/01/2016  . ALLERGIC RHINITIS CAUSE UNSPECIFIED 03/05/2010   Qualifier: Diagnosis of  By: Stann Mainland CMA, [place]    . Anemia 07/02/2016  . Benign localized hyperplasia of prostate with urinary obstruction   . BPH (benign prostatic hyperplasia) 05/25/2018  . CAP (community acquired pneumonia) 07/01/2016  . 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 Hypertension   . Nocturia   . Osteoarthritis   . Sarcoidosis    h/o, DX in the 90's  to early 2000s (Dr.Simonds)  . Sciatica 09/27/2014   Social History   Socioeconomic History  . Marital status: Married    Spouse name: Not on file  . Number of children: 2  . Years of education: Not on file  . Highest education level: Not on file  Occupational History  . Occupation: RETIRED 12-2016, works part time--[employer]    Employer: [employer]  Tobacco Use  . Smoking status: Never Smoker  . Smokeless tobacco: Never Used  Substance and Sexual Activity  . Alcohol use: No  . Drug use: No  . Sexual activity: Yes  Other Topics Concern  . Not on file  Social History Narrative    Household-- pt, wife    2 adult children   Social Determinants of Health   Financial Resource Strain:   . Difficulty of Paying Living Expenses: Not on file  Food Insecurity:   . Worried About Charity fundraiser in the Last Year: Not on file  . Ran Out of Food in the Last Year: Not on file  Transportation Needs:   . Lack of Transportation (Medical): Not on file  . Lack of Transportation (Non-Medical): Not on file  Physical Activity:   . Days of Exercise per Week: Not on file  . Minutes of Exercise per Session: Not on file  Stress:   . Feeling of Stress : Not on file  Social Connections:   . Frequency of Communication with Friends and Family: Not on file  . Frequency of Social Gatherings with Friends and Family: Not on file  . Attends Religious Services: Not on file  . Active Member of Clubs or Organizations: Not on file  . Attends Archivist Meetings: Not on file  . Marital Status: Not on file   Family History  Problem Relation Age of Onset  . Lung cancer Father        smoker  . Coronary artery disease Mother 48       stents at age 2  . Hypertension Mother   . Heart failure Mother   . Prostate cancer Neg Hx   . Colon cancer Neg Hx   . Stroke Neg Hx    Scheduled Meds: . aspirin  81 mg Oral Daily  . chlorhexidine  15 mL Mouth Rinse BID  . Chlorhexidine Gluconate  Cloth  6 each Topical Daily  . enoxaparin (LOVENOX) injection  110 mg Subcutaneous Q12H  . feeding supplement (ENSURE ENLIVE)  237 mL Oral TID BM  . insulin aspart  0-15 Units Subcutaneous TID WC  . insulin aspart  0-5 Units Subcutaneous QHS  . insulin detemir  10 Units Subcutaneous QHS  . loratadine  10 mg Oral Daily  . mouth rinse  15 mL Mouth Rinse BID  . tamsulosin  0.4 mg Oral Daily   Continuous Infusions: PRN Meds:.acetaminophen, albuterol,  guaiFENesin-dextromethorphan, haloperidol lactate, ipratropium, morphine injection, polyethylene glycol, senna-docusate No Known Allergies Review of Systems  Physical Exam  Vital Signs: BP 116/67 (BP Location: Left Arm)   Pulse (!) 107   Temp 98.7 F (37.1 C) (Axillary)   Resp (!) 34 Comment: PRN morphine given  Ht 6' (1.829 m)   Wt 108.8 kg   SpO2 92%   BMI 32.53 kg/m  Pain Scale: 0-10   Pain Score: 0-No pain   SpO2: SpO2: 92 % O2 Device:SpO2: 92 % O2 Flow Rate: .O2 Flow Rate (L/min): 15 L/min  IO: Intake/output summary:   Intake/Output Summary (Last 24 hours) at 11/15/2019 1212 Last data filed at 11/15/2019 1159 Gross per 24 hour  Intake 1320 ml  Output 1800 ml  Net -480 ml    LBM: Last BM Date: (pt unable to recall) Baseline Weight: Weight: 115.3 kg Most recent weight: Weight: 108.8 kg     Palliative Assessment/Data:     Time In/Out: [id number], 1420-1440 Time Total: 45 min Greater than 50%  of this time was spent counseling and coordinating care related to the above assessment and plan.  Signed by: Vinie Sill, NP Palliative Medicine Team Pager # [phone] (M-F 8a-5p) Team Phone # [phone] (Nights/Weekends)   The above conversation was completed via telephone due to the visitor restrictions during the COVID-19 pandemic. Thorough chart review and discussion with necessary members of the care team was completed as part of assessment. All issues were discussed and addressed but no physical exam was  performed.

## 2019-11-15 NOTE — Progress Notes (Signed)
Rehab Admissions Coordinator Note:  Per PT recommendation, this patient was screened by Raechel Ache for appropriateness for an Inpatient Acute Rehab Consult.  At this time, pt does not yet demonstrate an ability to tolerate an intensive therapy program. Agree pt will need post acute rehab. This AC will follow along at a distance and will await further therapy progress to help determine most appropriate post acute venue.  Raechel Ache 11/15/2019, 5:37 PM  I can be reached at [phone].

## 2019-11-15 NOTE — Plan of Care (Signed)

## 2019-11-16 DIAGNOSIS — J690 Pneumonitis due to inhalation of food and vomit: Secondary | ICD-10-CM

## 2019-11-16 LAB — GLUCOSE, CAPILLARY
Glucose-Capillary: 239 mg/dL — ABNORMAL HIGH (ref 70–99)
Glucose-Capillary: 242 mg/dL — ABNORMAL HIGH (ref 70–99)
Glucose-Capillary: 245 mg/dL — ABNORMAL HIGH (ref 70–99)
Glucose-Capillary: 182 mg/dL — ABNORMAL HIGH (ref 70–99)

## 2019-11-16 LAB — POCT I-STAT 7, (LYTES, BLD GAS, ICA,H+H)
Acid-base deficit: 10 mmol/L — ABNORMAL HIGH (ref 0.0–2.0)
Bicarbonate: 13.9 mmol/L — ABNORMAL LOW (ref 20.0–28.0)
Calcium, Ion: 1.1 mmol/L — ABNORMAL LOW (ref 1.15–1.40)
HCT: 21 % — ABNORMAL LOW (ref 39.0–52.0)
Hemoglobin: 7.1 g/dL — ABNORMAL LOW (ref 13.0–17.0)
O2 Saturation: 93 %
Patient temperature: 97.2
Potassium: 4.6 mmol/L (ref 3.5–5.1)
Sodium: 141 mmol/L (ref 135–145)
TCO2: 15 mmol/L — ABNORMAL LOW (ref 22–32)
pCO2 arterial: 23.3 mmHg — ABNORMAL LOW (ref 32.0–48.0)
pH, Arterial: 7.38 (ref 7.350–7.450)
pO2, Arterial: 63 mmHg — ABNORMAL LOW (ref 83.0–108.0)

## 2019-11-16 LAB — CBC
HCT: 28.5 % — ABNORMAL LOW (ref 39.0–52.0)
Hemoglobin: 8.7 g/dL — ABNORMAL LOW (ref 13.0–17.0)
MCH: 30.9 pg (ref 26.0–34.0)
MCHC: 30.5 g/dL (ref 30.0–36.0)
MCV: 101.1 fL — ABNORMAL HIGH (ref 80.0–100.0)
Platelets: 114 10*3/uL — ABNORMAL LOW (ref 150–400)
RBC: 2.82 MIL/uL — ABNORMAL LOW (ref 4.22–5.81)
RDW: 22.1 % — ABNORMAL HIGH (ref 11.5–15.5)
WBC: 33.4 10*3/uL — ABNORMAL HIGH (ref 4.0–10.5)
nRBC: 42.4 % — ABNORMAL HIGH (ref 0.0–0.2)

## 2019-11-16 LAB — COMPREHENSIVE METABOLIC PANEL
ALT: 54 U/L — ABNORMAL HIGH (ref 0–44)
AST: 66 U/L — ABNORMAL HIGH (ref 15–41)
Albumin: 3 g/dL — ABNORMAL LOW (ref 3.5–5.0)
Alkaline Phosphatase: 112 U/L (ref 38–126)
Anion gap: 11 (ref 5–15)
BUN: 85 mg/dL — ABNORMAL HIGH (ref 8–23)
CO2: 21 mmol/L — ABNORMAL LOW (ref 22–32)
Calcium: 8.3 mg/dL — ABNORMAL LOW (ref 8.9–10.3)
Chloride: 113 mmol/L — ABNORMAL HIGH (ref 98–111)
Creatinine, Ser: 1.69 mg/dL — ABNORMAL HIGH (ref 0.61–1.24)
GFR calc Af Amer: 48 mL/min — ABNORMAL LOW (ref >60)
GFR calc non Af Amer: 41 mL/min — ABNORMAL LOW (ref >60)
Glucose, Bld: 242 mg/dL — ABNORMAL HIGH (ref 70–99)
Potassium: 4.8 mmol/L (ref 3.5–5.1)
Sodium: 145 mmol/L (ref 135–145)
Total Bilirubin: 1.3 mg/dL — ABNORMAL HIGH (ref 0.3–1.2)
Total Protein: 5.6 g/dL — ABNORMAL LOW (ref 6.5–8.1)

## 2019-11-16 MED ORDER — LACTATED RINGERS IV BOLUS
500.0000 mL | Freq: Once | INTRAVENOUS | Status: AC
  Administered 2019-11-16: 23:00:00 500 mL via INTRAVENOUS

## 2019-11-16 MED ORDER — SODIUM CHLORIDE 0.9 % IV SOLN
3.0000 g | Freq: Four times a day (QID) | INTRAVENOUS | Status: DC
  Administered 2019-11-16 (×2): 3 g via INTRAVENOUS
  Filled 2019-11-16 (×2): qty 8

## 2019-11-16 MED ORDER — HALOPERIDOL LACTATE 5 MG/ML IJ SOLN: 2.0000 mg | Freq: Four times a day (QID) | INTRAMUSCULAR | Status: DC | PRN

## 2019-11-16 NOTE — Progress Notes (Signed)
Physical Therapy Treatment Patient Details Name: Levi Jones MRN: [id number] DOB: 1951/07/05 Today's Date: 11/16/2019    History of Present Illness 69 year old male patient admitted with COVID/acute respiratory failure; PMH includes Sarcoidosis,HTN, HLD, and BPH- condition (per chart) noted to be tenuous. Cleared with RN to perform PT evaluation    PT Comments    Attempted to see pt again this am and work on similar tasks as previous session, address pursed lip breathing exercises and possibly sitting edge of bed. Pt had similar response to sitting edge of bed, became extremely anxious and agitated with 02 sats dropping to min 78% and RR increasing to max 50. With cues and encouragement pt able to slowly increase 02 saturation to high 80s and decrease RR to 30s, vitals fluctuate and pt goes through periods of grasping at bed/therapists/bracing but continued cues help with this. Pt was minimally able to tolerate stance with BUE support and mod/max a x 2 assist and slight slide to left to move up in bed. Once in supine noted pt was again breathing through mouth, attempted to work on pursed lip breathing but pt minimally able to follow cues for this. Therapist has reduced frequency of treatment and will also decrease intensity of activity, work on more bed positioning and breathing activities and attempt to progress from there.     Follow Up Recommendations  SNF     Equipment Recommendations  None recommended by PT    Recommendations for Other Services       Precautions / Restrictions Precautions Precautions: Fall Precaution Comments: 02 sats drop w/ min effort, has anxious episodes and RR increase to 40s/50s Restrictions Weight Bearing Restrictions: No    Mobility  Bed Mobility Overal bed mobility: Needs Assistance Bed Mobility: Supine to Sit;Sit to Supine Rolling: Mod assist   Supine to sit: Mod assist Sit to supine: Mod assist   General bed mobility comments: was able to  get to edge of bed again with mod a, once sitting edge of bed became extremely agitated and 02 sats dropped to 78% with RR at 50. pt was on 10L HFNC and 15L NRB, needed max cues and increase to 15L HFNC and 15L NRB to increase sats to high 80s and decrease RR to 30s  Transfers Overall transfer level: Needs assistance Equipment used: 2 person hand held assist Transfers: Sit to/from Stand Sit to Stand: Mod assist;Max assist;+2 physical assistance         General transfer comment: was able to stand at edge of bed with mod a x 2 and hand held assist, attempted to take some side steps to scoot up in bed but minimally able to slide to left for this.  Ambulation/Gait             General Gait Details: attempted to take steps but not successfully   Stairs             Wheelchair Mobility    Modified Rankin (Stroke Patients Only)       Balance Overall balance assessment: Needs assistance Sitting-balance support: Feet supported;Bilateral upper extremity supported Sitting balance-Leahy Scale: Poor     Standing balance support: Bilateral upper extremity supported Standing balance-Leahy Scale: Poor                              Cognition Arousal/Alertness: Lethargic Behavior During Therapy: Agitated;Anxious Overall Cognitive Status: Impaired/Different from baseline Area of Impairment: Attention;Memory;Following commands;Safety/judgement;Awareness;Problem solving  Current Attention Level: Alternating Memory: Decreased recall of precautions;Decreased short-term memory Following Commands: Follows one step commands inconsistently;Follows one step commands with increased time Safety/Judgement: Decreased awareness of safety;Decreased awareness of deficits   Problem Solving: Slow processing;Decreased initiation;Difficulty sequencing;Requires verbal cues;Requires tactile cues General Comments: less moaning noted but more agittaed movements  today      Exercises Other Exercises Other Exercises: worked on pursed lip breathing in sitting and also in supine, pt has extreme diffiuclty with this and tends to breathe short shallow erratic breaths.    General Comments        Pertinent Vitals/Pain Pain Assessment: Faces Faces Pain Scale: Hurts even more Pain Location: with mobility Pain Descriptors / Indicators: Grimacing;Guarding;Discomfort Pain Intervention(s): Limited activity within patient's tolerance;Monitored during session    Home Living                      Prior Function            PT Goals (current goals can now be found in the care plan section) Acute Rehab PT Goals Patient Stated Goal: no goals stated today just agreement to attempt mobility PT Goal Formulation: With patient Time For Goal Achievement: 11/20/19 Potential to Achieve Goals: Poor Progress towards PT goals: Not progressing toward goals - comment(highly anxious about mobility)    Frequency    Min 2X/week      PT Plan Frequency needs to be updated    Co-evaluation              AM-PAC PT "6 Clicks" Mobility   Outcome Measure  Help needed turning from your back to your side while in a flat bed without using bedrails?: A Lot Help needed moving from lying on your back to sitting on the side of a flat bed without using bedrails?: A Lot Help needed moving to and from a bed to a chair (including a wheelchair)?: A Lot Help needed standing up from a chair using your arms (e.g., wheelchair or bedside chair)?: A Lot Help needed to walk in hospital room?: Total Help needed climbing 3-5 steps with a railing? : Total 6 Click Score: 10    End of Session Equipment Utilized During Treatment: Oxygen Activity Tolerance: Patient limited by fatigue;Patient limited by lethargy;Treatment limited secondary to medical complications (Comment) Patient left: in bed;with call bell/phone within reach Nurse Communication: Mobility status;Other  (comment)(post tx disposition) PT Visit Diagnosis: Muscle weakness (generalized) (M62.81)     Time: [id number] PT Time Calculation (min) (ACUTE ONLY): 31 min  Charges:  $Therapeutic Activity: 8-22 mins $Self Care/Home Management: [place], PT    Delford Field 11/16/2019, 1:27 PM

## 2019-11-16 NOTE — Plan of Care (Signed)

## 2019-11-16 NOTE — Progress Notes (Signed)
Patient alert & oriented x 4, gave permission for staff to speak to and give daughter Khamari Ternes information. Jenny Reichmann, charge nurse notified.

## 2019-11-16 NOTE — Progress Notes (Signed)
RT called to obtain ABG. Patient curerrently on 15L HFNC, NRB . Labored, Spo2 in 70S.  Placed on [place] @ 50L. 100% with NRB.

## 2019-11-16 NOTE — Progress Notes (Signed)
Pt continues to be tachypneic between 30-40 RR. Pt o2 sat between 90-94%. MD notified of pt's respiratory rate, labored breathing. Orders received for ABG's. Will continue to monitor.

## 2019-11-16 NOTE — Progress Notes (Signed)
Pharmacy Antibiotic Note  Levi Jones is a 69 y.o. male admitted on 11/08/2019 with COVID-19 PNA.  Pharmacy has been consulted for Unasyn dosing for possible aspiration PNA. Patient received a course of Unasyn 3 days last week for the same.   Plan: Unasyn 3 g iv q 6h.  F/U renal function, duration of therapy  Height: 6' (182.9 cm) Weight: 238 lb 1.6 oz (108 kg) IBW/kg (Calculated) : 77.6  Temp (24hrs), Avg:98 F (36.7 C), Min:97.6 F (36.4 C), Max:98.5 F (36.9 C)  Recent Labs  Lab 11/12/19 0101 11/13/19 0430 11/14/19 0931 11/15/19 0605 11/16/19 0501  WBC 18.2* 24.5* 29.8* 30.7* 33.4*  CREATININE 1.31* 1.44* 2.04* 1.67* 1.69*    Estimated Creatinine Clearance: 53.9 mL/min (A) (by C-G formula based on SCr of 1.69 mg/dL (H)).    No Known Allergies  1/5 remdesivir>>1/9 1/5 Actemra Unasyn 1/9>>1/11 Vanc 1/11>> 1/13 Cefepime 1/11>>1/14 Unasyn 1/20 >>  1/5 BCx: ngF 1/7 MRSA PCR: neg 1/11 Sputum Cx: sent 1/12 MRSA PCR: neg  Thank you for allowing pharmacy to be a part of this patient's care.  Napoleon Form 11/16/2019 3:58 PM

## 2019-11-16 NOTE — Progress Notes (Signed)
Levi Jones  [id number] DOB: 08-23-1951 DOA: 11/13/2019 PCP: Colon Branch, MD    Brief Narrative:  [phone] with a history of sarcoidosis w/ ILD, HTN, HLD, and BPH who was transported to the ED via EMS with 2-3 days of severe fatigue and shortness of breath.  He tested positive for Covid 12/25.  He was admitted after he was found to be in severe hypoxic respiratory failure initially requiring nonrebreather mask as well as heated high flow nasal cannula.  He was dosed with IV steroids, remdesivir, Actemra, and convalescent plasma.  He was transferred to [facility] 1/8 with persisting tenuous pulmonary status.  Significant Events: 12/25 COVID test + 1/5 admit to [facility] via ED 1/8 transfer to [facility] 1/12 transfer to ICU for heated high flow nasal cannula 1/13 coretrak placed - returned to PCU  1/14 pulled out his own coretrak 1/16 PM transfer to ICU in resp distress - trial BIPAP failed > HHFNC   COVID-19 specific Treatment: Remdesivir 1/5 > 1/9 Decadron 1/5 > 1/6 Solu-Medrol 1/6 > 1/14 Actemra 1/5  Antimicrobials:  Anti-infectives (From admission, onward)   Start     Dose/Rate Route Frequency Ordered Stop   11/08/19 1400  ceFEPIme (MAXIPIME) 2 g in sodium chloride 0.9 % 100 mL IVPB  Status:  Discontinued     2 g 200 mL/hr over 30 Minutes Intravenous Every 8 hours 11/08/19 0821 11/10/19 0912   11/08/19 0500  vancomycin (VANCOREADY) IVPB 750 mg/150 mL  Status:  Discontinued     750 mg 150 mL/hr over 60 Minutes Intravenous Every 12 hours 11/07/19 1212 11/09/19 1611   11/07/19 1300  ceFEPIme (MAXIPIME) 2 g in sodium chloride 0.9 % 100 mL IVPB  Status:  Discontinued     2 g 200 mL/hr over 30 Minutes Intravenous Every 12 hours 11/07/19 1210 11/08/19 0821   11/07/19 1300  vancomycin (VANCOREADY) IVPB 2000 mg/400 mL     2,000 mg 200 mL/hr over 120 Minutes Intravenous  Once 11/07/19 1210 11/07/19 1600   11/05/19 1600  Ampicillin-Sulbactam (UNASYN) 3 g in sodium chloride 0.9 %  100 mL IVPB  Status:  Discontinued     3 g 200 mL/hr over 30 Minutes Intravenous Every 6 hours 11/05/19 1454 11/07/19 1210   11/02/19 1000  remdesivir 100 mg in sodium chloride 0.9 % 100 mL IVPB  Status:  Discontinued     100 mg 200 mL/hr over 30 Minutes Intravenous Daily 11/18/2019 1838 11/16/2019 1847   11/02/19 1000  remdesivir 100 mg in sodium chloride 0.9 % 100 mL IVPB     100 mg 200 mL/hr over 30 Minutes Intravenous Daily 11/21/2019 1054 11/05/19 1017   11/07/2019 1838  remdesivir 200 mg in sodium chloride 0.9% 250 mL IVPB  Status:  Discontinued     200 mg 580 mL/hr over 30 Minutes Intravenous Once 11/05/2019 1838 11/26/2019 1847   11/16/2019 1200  remdesivir 200 mg in sodium chloride 0.9% 250 mL IVPB     200 mg 580 mL/hr over 30 Minutes Intravenous Once 11/06/2019 1054 11/06/2019 1302       Subjective: Patient currently on high flow nasal cannula along with nonrebreather.  He does open his eyes when I call his name.  He denies any pain.  However noted to be lethargic.    Assessment & Plan:  Acute respiratory failure with hypoxia/pneumonia due to COVID-19 His respiratory status remains tenuous.  He has completed course of remdesivir.  He also received steroids.  He  also received Actemra.  Patient still requiring high flow nasal cannula along with nonrebreather.   Continue with incentive spirometry prone positioning as much as possible.  He was given diuretics however this is being held due to rising BUN and creatinine.  Prognosis is guarded at this time.  WBC is noted to be elevated.  Procalcitonin was 0.26 few days ago.  Consider adding antibiotics.  Recent Labs  Lab 11/10/19 0329 11/11/19 0004 11/12/19 0101 11/13/19 0430 11/14/19 0931 11/15/19 0605 11/16/19 0501  DDIMER 8.00* 14.43* >20.00*  --   --   --   --   CRP 0.6 0.6  --   --   --   --   --   ALT 37 40 44  --   --  48* 54*  PROCALCITON 0.25  --   --  0.30 0.26  --   --      Markedly elevated d-dimer  D-dimer had increased to  greater than 20.  Lower extremity Dopplers did not show DVT.  CT angiogram on 1/7 did not show PE.  However it was felt that patient's likelihood of having some kind of venous thromboembolism was very high so he was placed on full dose Lovenox.  Continue to trend D-dimer.  Acute Kidney Injury  Likely due to pre-renal azotemia - pt eating and drinking very little, but did not tolerate feeding tube.  Lasix on hold.  Renal function has improved.  Monitor urine output.  Hypernatremia  Due to poor free water intake.  Aspiration pneumonia Patient received Unasyn for 3days last week.  No recent antibiotics.  With climbing WBC it may be worthwhile reinitiating antibiotics.  Speech therapy is following.  Feeding tube was pulled out with patient.  Does not appear to be a good candidate for same at this time.    Possible CHF - unable to quantify or qualify - unclear diagnosis  Negative approximately 4.2L since admission -monitor ins and outs - EF 55-60% via TTE Sept 2018  Sarcoidosis with pulmonary involvement Pathognomonic findings noted on CT chest 11/03/19, and also per report present in 2018 -does not appear the patient was receiving regular follow-up in a pulmonary clinic and he does not appear to have been aware of the severity of his baseline lung disease  Possible underlying liver cirrhosis Noted on CT of chest - no known hx of this.  Transaminitis most likely due to COVID-19.  Essential hypertension  Monitor blood pressures closely.  Macrocytic anemia No evidence of overt bleeding.  Monitor hemoglobin closely.  BPH Continue Flomax  Steroid-induced hyperglycemia CBGs elevated despite patient being off of steroids.  No known history of diabetes.  HbA1c 5.3 in December.  DVT prophylaxis: Lovenox Code Status: DNR Family Communication: We will update his family later today Disposition Plan: PCU  Consultants:  PCCM  Objective: Blood pressure (!) 95/52, pulse (!) 106, temperature 97.6  F (36.4 C), temperature source Axillary, resp. rate (!) 29, height 6' (1.829 m), weight 108 kg, SpO2 94 %.  Intake/Output Summary (Last 24 hours) at 11/16/2019 1516 Last data filed at 11/16/2019 1130 Gross per 24 hour  Intake 858.33 ml  Output 2025 ml  Net -1166.67 ml   Filed Weights   11/13/19 0443 11/14/19 0425 11/16/19 0444  Weight: 106.8 kg 108.8 kg 108 kg    Examination:  General appearance: Somewhat lethargic.  Arousable.  No distress. Resp: Tachypneic.  Crackles bilateral bases.  No wheezing or rhonchi. Cardio: S1-S2 is normal regular.  No S3-S4.  No rubs murmurs or bruit GI: Abdomen is soft.  Nontender nondistended.  Bowel sounds are present normal.  No masses organomegaly Extremities: No edema.   Neurologic: Lethargic.  Confused.  No obvious focal neurological deficits.   CBC: Recent Labs  Lab 11/10/19 0329 11/10/19 0329 11/11/19 0004 11/12/19 0101 11/14/19 0931 11/15/19 0605 11/16/19 0501  WBC 18.6*   < > 21.4*   < > 29.8* 30.7* 33.4*  NEUTROABS 17.2*  --  19.5*  --   --   --   --   HGB 12.8*   < > 12.4*   < > 10.5* 9.7* 8.7*  HCT 41.5   < > 40.4   < > 33.6* 32.0* 28.5*  MCV 93.7   < > 91.8   < > 95.2 98.2 101.1*  PLT 166   < > PLATELET CLUMPS NOTED ON SMEAR, UNABLE TO ESTIMATE   < > 131* 117* 114*   < > = values in this interval not displayed.   Basic Metabolic Panel: Recent Labs  Lab 11/10/19 0329 11/10/19 0329 11/11/19 0004 11/12/19 0101 11/13/19 0430 11/13/19 0430 11/14/19 0931 11/15/19 0605 11/16/19 0501  NA 141   < > 142   < > 143   < > 144 146* 145  K 4.7   < > 4.7   < > 5.3*   < > 4.5 4.3 4.8  CL 113*   < > 111   < > 112*   < > 111 114* 113*  CO2 16*   < > 20*   < > 20*   < > 21* 20* 21*  GLUCOSE 85   < > 148*   < > 203*   < > 171* 172* 242*  BUN 34*   < > 41*   < > 68*   < > 82* 78* 85*  CREATININE 1.20   < > 1.44*   < > 1.44*   < > 2.04* 1.67* 1.69*  CALCIUM 7.9*   < > 8.4*   < > 8.1*   < > 7.9* 8.0* 8.3*  MG 2.8*  --  3.0*  --  2.5*   --   --   --   --   PHOS  --   --   --   --   --   --  4.9*  --   --    < > = values in this interval not displayed.   GFR: Estimated Creatinine Clearance: 53.9 mL/min (A) (by C-G formula based on SCr of 1.69 mg/dL (H)).  Liver Function Tests: Recent Labs  Lab 11/11/19 0004 11/11/19 0004 11/12/19 0101 11/14/19 0931 11/15/19 0605 11/16/19 0501  AST 30  --  31  --  50* 66*  ALT 40  --  44  --  48* 54*  ALKPHOS 104  --  93  --  97 112  BILITOT 1.0  --  0.8  --  1.1 1.3*  PROT 6.3*  --  5.8*  --  5.9* 5.6*  ALBUMIN 3.1*   < > 2.8* 3.0* 3.0* 3.0*   < > = values in this interval not displayed.    HbA1C: Hgb A1c MFr Bld  Date/Time Value Ref Range Status  09/28/2019 08:58 AM 5.3 4.6 - 6.5 % Final    Comment:    Glycemic Control Guidelines for People with Diabetes:Non Diabetic:  <6%Goal of Therapy: <7%Additional Action Suggested:  >8%   03/04/2017 03:31 PM 5.4 4.6 - 6.5 % Final  Comment:    Glycemic Control Guidelines for People with Diabetes:Non Diabetic:  <6%Goal of Therapy: <7%Additional Action Suggested:  >8%     CBG: Recent Labs  Lab 11/15/19 1126 11/15/19 1634 11/15/19 2046 11/16/19 0721 11/16/19 1140  GLUCAP 231* 236* 304* 239* 242*    Recent Results (from the past 240 hour(s))  MRSA PCR Screening     Status: None   Collection Time: 11/08/19 12:42 PM   Specimen: Nasal Mucosa; Nasopharyngeal  Result Value Ref Range Status   MRSA by PCR NEGATIVE NEGATIVE Final    Comment:        The GeneXpert MRSA Assay (FDA approved for NASAL specimens only), is one component of a comprehensive MRSA colonization surveillance program. It is not intended to diagnose MRSA infection nor to guide or monitor treatment for MRSA infections. Performed at [facility], [place] [address], [place], [place] [address]       Radiology DG Chest [place] 1 View  Result Date: 11/13/2019 CLINICAL DATA:  Pneumonia due to COVID-19 virus. EXAM: PORTABLE CHEST 1 VIEW  COMPARISON:  November 08, 2019. FINDINGS: Stable cardiomediastinal silhouette. No pneumothorax is noted. Stable bilateral lung opacities are noted consistent with multifocal pneumonia. No definite pleural effusion is noted. Bony thorax is unremarkable. IMPRESSION: Stable bilateral lung opacities consistent with multifocal pneumonia. Electronically Signed   By: Marijo Conception M.D.   On: 11/13/2019 08:51   VAS [place] LOWER EXTREMITY VENOUS (DVT)  Result Date: 11/13/2019  Lower Venous Study Indications: Elevated ddimer.  Comparison Study: no prior Performing Technologist: Abram Sander RVS  Examination Guidelines: A complete evaluation includes B-mode imaging, spectral Doppler, color Doppler, and power Doppler as needed of all accessible portions of each vessel. Bilateral testing is considered an integral part of a complete examination. Limited examinations for reoccurring indications may be performed as noted.  +---------+---------------+---------+-----------+----------+--------------+  RIGHT     Compressibility Phasicity Spontaneity Properties Thrombus Aging  +---------+---------------+---------+-----------+----------+--------------+  CFV       Full            Yes       Yes                                    +---------+---------------+---------+-----------+----------+--------------+  SFJ       Full                                                             +---------+---------------+---------+-----------+----------+--------------+  FV Prox   Full                                                             +---------+---------------+---------+-----------+----------+--------------+  FV Mid    Full                                                             +---------+---------------+---------+-----------+----------+--------------+  FV Distal Full                                                             +---------+---------------+---------+-----------+----------+--------------+  PFV       Full                                                              +---------+---------------+---------+-----------+----------+--------------+  POP       Full            Yes       Yes                                    +---------+---------------+---------+-----------+----------+--------------+  PTV       Full                                                             +---------+---------------+---------+-----------+----------+--------------+  PERO      Full                                                             +---------+---------------+---------+-----------+----------+--------------+   +---------+---------------+---------+-----------+----------+--------------+  LEFT      Compressibility Phasicity Spontaneity Properties Thrombus Aging  +---------+---------------+---------+-----------+----------+--------------+  CFV       Full            Yes       Yes                                    +---------+---------------+---------+-----------+----------+--------------+  SFJ       Full                                                             +---------+---------------+---------+-----------+----------+--------------+  FV Prox   Full                                                             +---------+---------------+---------+-----------+----------+--------------+  FV Mid    Full                                                             +---------+---------------+---------+-----------+----------+--------------+  FV Distal Full                                                             +---------+---------------+---------+-----------+----------+--------------+  PFV       Full                                                             +---------+---------------+---------+-----------+----------+--------------+  POP       Full            Yes       Yes                                    +---------+---------------+---------+-----------+----------+--------------+  PTV       Full                                                              +---------+---------------+---------+-----------+----------+--------------+  PERO      Full                                                             +---------+---------------+---------+-----------+----------+--------------+     Summary: Right: There is no evidence of deep vein thrombosis in the lower extremity. No cystic structure found in the popliteal fossa. Left: There is no evidence of deep vein thrombosis in the lower extremity. No cystic structure found in the popliteal fossa.  *See table(s) above for measurements and observations. Electronically signed by Monica Martinez MD on 11/13/2019 at 3:58:37 PM.    Final    [place] EKG SITE RITE  Result Date: 11/13/2019 If Site Rite image not attached, placement could not be confirmed due to current cardiac rhythm.    Scheduled Meds:  aspirin  81 mg Oral Daily   chlorhexidine  15 mL Mouth Rinse BID   Chlorhexidine Gluconate Cloth  6 each Topical Daily   enoxaparin (LOVENOX) injection  110 mg Subcutaneous Q12H   feeding supplement (ENSURE ENLIVE)  237 mL Oral TID BM   insulin aspart  0-15 Units Subcutaneous TID WC   insulin aspart  0-5 Units Subcutaneous QHS   insulin detemir  10 Units Subcutaneous QHS   loratadine  10 mg Oral Daily   mouth rinse  15 mL Mouth Rinse BID   tamsulosin  0.4 mg Oral Daily     LOS: 15 days   [facility]  [phone] Pager - Text Page per Shea Evans  If 7PM-7AM, please contact night-coverage per Amion 11/16/2019, 3:16 PM

## 2019-11-16 NOTE — Progress Notes (Signed)
Patient is currently on 8lpm HFNC and 15 lpm NRB, with sat of 93%. Gradually attempted to increase HFNC to 15 and removed NRB, without success desat to 83% and resp in 40's. Patient resting comfortably at this time.

## 2019-11-16 NOTE — Progress Notes (Signed)
Nutrition Follow-up  DOCUMENTATION CODES:   Severe malnutrition in context of acute illness/injury  INTERVENTION:    Continue to offer Ensure Enlive po TID, each supplement provides 350 kcal and 20 grams of protein.  Continue Magic cup BID with lunch and dinner, each supplement provides 290 kcal and 9 grams of protein.   Diet advancement as able per SLP.  NUTRITION DIAGNOSIS:   Severe Malnutrition related to acute illness(COVID-19) as evidenced by energy intake < or equal to 50% for > or equal to 5 days, moderate muscle depletion.  Ongoing   GOAL:   Patient will meet greater than or equal to 90% of their needs   Progressing  MONITOR:   TF tolerance, Skin, Labs, I & O's, Weight trends  ASSESSMENT:   69 yo male admitted with worsening fatigue and SOB x 2-3 days. Tested positive for COVID-19 on 12/25. PMH includes HTN, sarcoidosis, anemia.   Palliative care team is following. Patient is currently DNR with plans to continue aggressive care for now.   Currently requiring HFNC, 15 L oxygen. Receiving low dose morphine for dyspnea.  Diet changed to full liquids on 1/18 by SLP due to high aspiration risk with lethargy. He ate 100% of breakfast and lunch yesterday and 50% of dinner. Full liquid diet is inadequate in protein and calories even with 100% completion. Patient is being offered Ensure Enlive supplements TID between meals. He refused all three bottles yesterday, drank one bottle this morning and refused the second dose.   Labs reviewed.  CBG's: [phone]  Medications reviewed and include novolog SSI w/ meals and at bedtime, levemir, flomax.  108 kg today, down from 115.3 kg on admission. 6% weight loss in the past 2 weeks is significant for the time frame. Severe protein calorie malnutrition is ongoing.   Diet Order:   Diet Order            Diet full liquid Room service appropriate? Yes; Fluid consistency: Thin  Diet effective now               EDUCATION NEEDS:   Not appropriate for education at this time  Skin:  Skin Assessment: Reviewed RN Assessment  Last BM:  Last BM documented 1/16 (type 5)  Height:   Ht Readings from Last 1 Encounters:  11/02/19 6' (1.829 m)    Weight:   Wt Readings from Last 1 Encounters:  11/16/19 108 kg    Ideal Body Weight:  80.9 kg  BMI:  Body mass index is 32.29 kg/m.  Estimated Nutritional Needs:   Kcal:  [id number]  Protein:  130-150 gm  Fluid:  >/= 2.5 L    Molli Barrows, RD, LDN, [place] Pager [phone] After Hours Pager [phone]

## 2019-11-16 NOTE — Progress Notes (Signed)
MD notified of pt's BP of 86/44. Orders received to give LR 500 ml bolus. LR 500 ml bolus infused. Pt's BP 94/54 after infusion. Orders received to continue D5 @ 50 ml/hr after bolus. Will continue to monitor.

## 2019-11-16 NOTE — Plan of Care (Signed)
  Problem: Education: Goal: Knowledge of General Education information will improve Description: Including pain rating scale, medication(s)/side effects and non-pharmacologic comfort measures 11/16/2019 1407 by Cyndi Bender, RN Outcome: Progressing 11/16/2019 1407 by Cyndi Bender, RN Outcome: Progressing   Problem: Health Behavior/Discharge Planning: Goal: Ability to manage health-related needs will improve 11/16/2019 1407 by Cyndi Bender, RN Outcome: Progressing 11/16/2019 1407 by Cyndi Bender, RN Outcome: Progressing   Problem: Clinical Measurements: Goal: Ability to maintain clinical measurements within normal limits will improve 11/16/2019 1407 by Cyndi Bender, RN Outcome: Progressing 11/16/2019 1407 by Cyndi Bender, RN Outcome: Progressing Goal: Will remain free from infection 11/16/2019 1407 by Cyndi Bender, RN Outcome: Progressing 11/16/2019 1407 by Cyndi Bender, RN Outcome: Progressing Goal: Diagnostic test results will improve 11/16/2019 1407 by Cyndi Bender, RN Outcome: Progressing 11/16/2019 1407 by Cyndi Bender, RN Outcome: Progressing Goal: Respiratory complications will improve 11/16/2019 1407 by Cyndi Bender, RN Outcome: Progressing 11/16/2019 1407 by Cyndi Bender, RN Outcome: Progressing Goal: Cardiovascular complication will be avoided 11/16/2019 1407 by Cyndi Bender, RN Outcome: Progressing 11/16/2019 1407 by Cyndi Bender, RN Outcome: Progressing   Problem: Activity: Goal: Risk for activity intolerance will decrease 11/16/2019 1407 by Cyndi Bender, RN Outcome: Progressing 11/16/2019 1407 by Cyndi Bender, RN Outcome: Progressing   Problem: Nutrition: Goal: Adequate nutrition will be maintained 11/16/2019 1407 by Cyndi Bender, RN Outcome: Progressing 11/16/2019 1407 by Cyndi Bender, RN Outcome: Progressing   Problem: Coping: Goal: Level of anxiety will decrease 11/16/2019 1407 by Cyndi Bender, RN Outcome: Progressing 11/16/2019 1407 by Cyndi Bender,  RN Outcome: Progressing   Problem: Elimination: Goal: Will not experience complications related to bowel motility 11/16/2019 1407 by Cyndi Bender, RN Outcome: Progressing 11/16/2019 1407 by Cyndi Bender, RN Outcome: Progressing Goal: Will not experience complications related to urinary retention 11/16/2019 1407 by Cyndi Bender, RN Outcome: Progressing 11/16/2019 1407 by Cyndi Bender, RN Outcome: Progressing   Problem: Pain Managment: Goal: General experience of comfort will improve 11/16/2019 1407 by Cyndi Bender, RN Outcome: Progressing 11/16/2019 1407 by Cyndi Bender, RN Outcome: Progressing   Problem: Safety: Goal: Ability to remain free from injury will improve 11/16/2019 1407 by Cyndi Bender, RN Outcome: Progressing 11/16/2019 1407 by Cyndi Bender, RN Outcome: Progressing   Problem: Skin Integrity: Goal: Risk for impaired skin integrity will decrease 11/16/2019 1407 by Cyndi Bender, RN Outcome: Progressing 11/16/2019 1407 by Cyndi Bender, RN Outcome: Progressing   Problem: Education: Goal: Knowledge of risk factors and measures for prevention of condition will improve 11/16/2019 1407 by Cyndi Bender, RN Outcome: Progressing 11/16/2019 1407 by Cyndi Bender, RN Outcome: Progressing   Problem: Coping: Goal: Psychosocial and spiritual needs will be supported 11/16/2019 1407 by Cyndi Bender, RN Outcome: Progressing 11/16/2019 1407 by Cyndi Bender, RN Outcome: Progressing   Problem: Respiratory: Goal: Will maintain a patent airway 11/16/2019 1407 by Cyndi Bender, RN Outcome: Progressing 11/16/2019 1407 by Cyndi Bender, RN Outcome: Progressing Goal: Complications related to the disease process, condition or treatment will be avoided or minimized 11/16/2019 1407 by Cyndi Bender, RN Outcome: Progressing 11/16/2019 1407 by Cyndi Bender, RN Outcome: Progressing

## 2019-11-17 DIAGNOSIS — E43 Unspecified severe protein-calorie malnutrition: Secondary | ICD-10-CM

## 2019-11-17 DIAGNOSIS — D869 Sarcoidosis, unspecified: Secondary | ICD-10-CM

## 2019-11-17 LAB — BASIC METABOLIC PANEL
Anion gap: 16 — ABNORMAL HIGH (ref 5–15)
BUN: 114 mg/dL — ABNORMAL HIGH (ref 8–23)
CO2: 16 mmol/L — ABNORMAL LOW (ref 22–32)
Calcium: 7.7 mg/dL — ABNORMAL LOW (ref 8.9–10.3)
Chloride: 110 mmol/L (ref 98–111)
Creatinine, Ser: 2.25 mg/dL — ABNORMAL HIGH (ref 0.61–1.24)
GFR calc Af Amer: 34 mL/min — ABNORMAL LOW (ref >60)
GFR calc non Af Amer: 29 mL/min — ABNORMAL LOW (ref >60)
Glucose, Bld: 194 mg/dL — ABNORMAL HIGH (ref 70–99)
Potassium: 5.2 mmol/L — ABNORMAL HIGH (ref 3.5–5.1)
Sodium: 142 mmol/L (ref 135–145)

## 2019-11-17 LAB — LACTIC ACID, PLASMA: Lactic Acid, Venous: 6.8 mmol/L (ref 0.5–1.9)

## 2019-11-17 LAB — PROCALCITONIN: Procalcitonin: 0.52 ng/mL

## 2019-11-17 LAB — GLUCOSE, CAPILLARY: Glucose-Capillary: 147 mg/dL — ABNORMAL HIGH (ref 70–99)

## 2019-11-17 MED ORDER — ACETAMINOPHEN 325 MG PO TABS: 650.0000 mg | ORAL_TABLET | Freq: Four times a day (QID) | ORAL | Status: DC | PRN

## 2019-11-17 MED ORDER — DEXTROSE 5 % IV SOLN: INTRAVENOUS | Status: DC

## 2019-11-17 MED ORDER — GLYCOPYRROLATE 0.2 MG/ML IJ SOLN: 0.2000 mg | INTRAMUSCULAR | Status: DC | PRN

## 2019-11-17 MED ORDER — VANCOMYCIN HCL 2000 MG/400ML IV SOLN
2000.0000 mg | INTRAVENOUS | Status: DC
  Administered 2019-11-17: 2000 mg via INTRAVENOUS
  Filled 2019-11-17: qty 400

## 2019-11-17 MED ORDER — GLYCOPYRROLATE 1 MG PO TABS: 1.0000 mg | ORAL_TABLET | ORAL | Status: DC | PRN

## 2019-11-17 MED ORDER — VANCOMYCIN HCL 1750 MG/350ML IV SOLN: 1750.0000 mg | INTRAVENOUS | Status: DC

## 2019-11-17 MED ORDER — ACETAMINOPHEN 650 MG RE SUPP: 650.0000 mg | Freq: Four times a day (QID) | RECTAL | Status: DC | PRN

## 2019-11-17 MED ORDER — POLYVINYL ALCOHOL 1.4 % OP SOLN: 1.0000 [drp] | Freq: Four times a day (QID) | OPHTHALMIC | Status: DC | PRN

## 2019-11-17 MED ORDER — SODIUM CHLORIDE 0.9 % IV SOLN
2.0000 g | Freq: Two times a day (BID) | INTRAVENOUS | Status: DC
  Administered 2019-11-17: 01:00:00 2 g via INTRAVENOUS
  Filled 2019-11-17: qty 2

## 2019-11-17 MED ORDER — MORPHINE SULFATE (PF) 2 MG/ML IV SOLN
2.0000 mg | INTRAVENOUS | Status: DC | PRN
  Administered 2019-11-17: 03:00:00 2 mg via INTRAVENOUS

## 2019-11-17 MED ORDER — SODIUM BICARBONATE 8.4 % IV SOLN
INTRAVENOUS | Status: AC
  Filled 2019-11-17: qty 50

## 2019-11-17 MED ORDER — DIPHENHYDRAMINE HCL 50 MG/ML IJ SOLN: 25.0000 mg | INTRAMUSCULAR | Status: DC | PRN

## 2019-11-17 MED ORDER — MORPHINE BOLUS VIA INFUSION
5.0000 mg | INTRAVENOUS | Status: DC | PRN
  Filled 2019-11-17: qty 5

## 2019-11-17 MED ORDER — SODIUM BICARBONATE 8.4 % IV SOLN
50.0000 meq | Freq: Once | INTRAVENOUS | Status: DC
  Administered 2019-11-17: 50 meq via INTRAVENOUS

## 2019-11-17 MED ORDER — LACTATED RINGERS IV SOLN: INTRAVENOUS | Status: DC

## 2019-11-17 MED ORDER — MORPHINE 100MG IN NS 100ML (1MG/ML) PREMIX INFUSION
0.0000 mg/h | INTRAVENOUS | Status: DC
  Administered 2019-11-17: 05:00:00 5 mg/h via INTRAVENOUS
  Filled 2019-11-17: qty 100

## 2019-11-17 MED ORDER — LACTATED RINGERS IV BOLUS
500.0000 mL | Freq: Once | INTRAVENOUS | Status: AC
  Administered 2019-11-17: 500 mL via INTRAVENOUS

## 2019-11-20 ENCOUNTER — Encounter: Payer: Self-pay | Admitting: Internal Medicine

## 2019-11-28 NOTE — Progress Notes (Signed)
Pt deceased at 0808.  Declared by Dr [place] Pink.  Pts wife to pick up cellphone, charger & yellow ring from security.

## 2019-11-28 NOTE — Progress Notes (Signed)
Pharmacy Antibiotic Note  Levi Jones is a 69 y.o. male admitted on 11/26/2019 with COVID 19.  Pt on Unasyn Day #2 for possible asp pna. Pharmacy has been consulted to broaden abx to Cefepime and Vancomycin for sepsis.  Noted pt on Cefepime and Vanc earlier this admission.  Plan: Cefepime 2gm IV q12h Vancomycin 2gm IV now then 1750 mg IV Q 48 hrs. Goal AUC 400-550. Expected AUC: 485 SCr used: 2.25; Vd coeff 0.5 in pt with BMI > 30 Will f/u renal function, micro data, and pt's clinical condition Vanc levels prn   Height: 6' (182.9 cm) Weight: 238 lb 1.6 oz (108 kg) IBW/kg (Calculated) : 77.6  Temp (24hrs), Avg:97.5 F (36.4 C), Min:97 F (36.1 C), Max:97.9 F (36.6 C)  Recent Labs  Lab 11/12/19 0101 11/12/19 0101 11/13/19 0430 11/14/19 0931 11/15/19 0605 11/16/19 0501 11/16/19 2305  WBC 18.2*  --  24.5* 29.8* 30.7* 33.4*  --   CREATININE 1.31*   < > 1.44* 2.04* 1.67* 1.69* 2.25*  LATICACIDVEN  --   --   --   --   --   --  6.8*   < > = values in this interval not displayed.    Estimated Creatinine Clearance: 40.5 mL/min (A) (by C-G formula based on SCr of 2.25 mg/dL (H)).    No Known Allergies  Antimicrobials this admission: 1/5 remdesivir>>1/9 1/5 Actemra Unasyn 1/9>>1/11; restarted 1/20>>1/21 Vanc 1/11>> 1/13; restarted 1/21>> Cefepime 1/11>>1/14; restarted 1/21>>    Microbiology results: 1/5 BCx: neg 1/7 MRSA PCR: neg 1/12 MRSA PCR: neg  Thank you for allowing pharmacy to be a part of this patient's care.  Sherlon Handing, PharmD, BCPS Please see amion for complete clinical pharmacist phone list 12-04-2019 12:47 AM

## 2019-11-28 NOTE — Progress Notes (Signed)
CRITICAL VALUE ALERT  Critical Value:  Lactic 6.8  Date & Time Notied:  11/23/2019 0023  Provider Notified: Charge RN took critical lab and reported  to primary RN Lafayette Dragon  Orders Received/Actions taken: MD Paged

## 2019-11-28 NOTE — Death Summary Note (Signed)
DEATH SUMMARY   Patient Details  Name: Levi Jones MRN: [id number] DOB: 1951/03/02  Admission/Discharge Information   Admit Date:  November 04, 2019  Date of Death: Date of Death: 2019/11/20  Time of Death: Time of Death: 0808  Length of Stay: 01-13-23  Referring Physician: Colon Branch, MD   Reason(s) for Hospitalization  Pneumonia due to COVID-19  Diagnoses  Preliminary cause of death: Pneumonia due to COVID-19 virus  Secondary Diagnoses (including complications and co-morbidities):  Acute respiratory failure with hypoxia Sarcoidosis Essential hypertension Hyperlipidemia History of BPH Severe protein calorie malnutrition  Brief Hospital Course (including significant findings, care, treatment, and services provided and events leading to death)   Brief Narrative:  69yo with a history of sarcoidosis w/ ILD, HTN, HLD, and BPH who was transported to the ED via EMS with 2-3 days of severe fatigue and shortness of breath.  He tested positive for Covid 12/25.  He was admitted after he was found to be in severe hypoxic respiratory failure initially requiring nonrebreather mask as well as heated high flow nasal cannula.  He was dosed with IV steroids, remdesivir, Actemra, and convalescent plasma.  He was transferred to [facility] 1/8 with persisting tenuous pulmonary status.  Significant Events: 12/25 COVID test + 1/5 admit to [facility] via ED 1/8 transfer to [facility] 1/12 transfer to ICU for heated high flow nasal cannula 1/13 coretrak placed - returned to PCU  1/14 pulled out his own coretrak 1/16 PM transfer to ICU in resp distress - trial BIPAP failed > HHFNC   COVID-19 specific Treatment: Remdesivir 1/5 > 1/9 Decadron 1/5 > 1/6 Solu-Medrol 1/6 > 1/14 Actemra 1/5  Hospital course:  Acute respiratory failure with hypoxia/pneumonia due to COVID-19 Patient was hospitalized and placed on steroids and remdesivir.  He was also given Actemra due to severity of illness.  Patient  did not improve despite treatment.  His oxygen requirements continue to climb.  He became encephalopathic.  WBC continues to be elevated.  There was concern for aspiration.  He was started on antibacterials.  However patient declined over the course of the night on 1/20.  Critical care medicine was consulted over the course of the night.  They have discussed with family.  Patient was transitioned to comfort care.  He expired on 11-20-19 at 8:08 AM.   Markedly elevated d-dimer  D-dimer had increased to greater than 20.  Lower extremity Dopplers did not show DVT.  CT angiogram on 1/7 did not show PE.  However it was felt that patient's likelihood of having some kind of venous thromboembolism was very high so he was placed on full dose Lovenox.    Acute Kidney Injury  Likely due to pre-renal azotemia   Hypernatremia  Due to poor free water intake.  Aspiration pneumonia  Possible CHF - unable to quantify or qualify - unclear diagnosis   Sarcoidosis with pulmonary involvement Pathognomonic findings noted on CT chest 11/03/19, and also per report present in 01-21-17 -does not appear the patient was receiving regular follow-up in a pulmonary clinic and he does not appear to have been aware of the severity of his baseline lung disease  Possible underlying liver cirrhosis Noted on CT of chest - no known hx of this.  Transaminitis most likely due to COVID-19.  Essential hypertension   Macrocytic anemia  BPH  Steroid-induced hyperglycemia No known history of diabetes.  HbA1c 5.3 in December.     Pertinent Labs and Studies  Significant Diagnostic Studies CT  ANGIO CHEST PE W OR WO CONTRAST  Result Date: 11/03/2019 CLINICAL DATA:  Shortness of breath, elevated D-dimer, hypoxia EXAM: CT ANGIOGRAPHY CHEST WITH CONTRAST TECHNIQUE: Multidetector CT imaging of the chest was performed using the standard protocol during bolus administration of intravenous contrast. Multiplanar CT image  reconstructions and MIPs were obtained to evaluate the vascular anatomy. CONTRAST:  146mL OMNIPAQUE IOHEXOL 350 MG/ML SOLN COMPARISON:  07/27/2017 FINDINGS: Cardiovascular: Examination for pulmonary embolism is somewhat limited by breath motion artifact, particularly in the lower lobes. Within this limitation, no evidence of pulmonary embolism through the segmental pulmonary arterial level. Normal heart size. No pericardial effusion. Mediastinum/Nodes: Numerous calcified bilateral hilar and mediastinal lymph nodes. Thyroid gland, trachea, and esophagus demonstrate no significant findings. Lungs/Pleura: Extensive bilateral heterogeneous and ground-glass airspace opacity. Fibrotic change of the perihilar upper lobes (series 6, image 65). Upper Abdomen: No acute abnormality. Somewhat coarse appearing contour of the liver in the included upper abdomen. Musculoskeletal: No chest wall abnormality. No acute or significant osseous findings. Review of the MIP images confirms the above findings. IMPRESSION: 1. Examination for pulmonary embolism is somewhat limited by breath motion artifact, particularly in the lower lobes. Within this limitation, no evidence of pulmonary embolism through the segmental pulmonary arterial level. 2. Extensive bilateral heterogeneous and ground-glass airspace opacity. Findings are consistent with multifocal infection, edema, and/or ARDS. 3. Fibrotic change of the perihilar upper lobes, combined with numerous calcified bilateral hilar and mediastinal lymph nodes, constellation of findings generally consistent with fibrotic pulmonary sarcoidosis and similar to appearance on prior chest radiographs dated 07/27/2017. 4. Somewhat coarse appearing contour of the liver in the included upper abdomen, suggestive of cirrhosis. Electronically Signed   By: Eddie Candle M.D.   On: 11/03/2019 12:33   DG Chest Port 1 View  Result Date: 11/13/2019 CLINICAL DATA:  Pneumonia due to COVID-19 virus. EXAM:  PORTABLE CHEST 1 VIEW COMPARISON:  November 08, 2019. FINDINGS: Stable cardiomediastinal silhouette. No pneumothorax is noted. Stable bilateral lung opacities are noted consistent with multifocal pneumonia. No definite pleural effusion is noted. Bony thorax is unremarkable. IMPRESSION: Stable bilateral lung opacities consistent with multifocal pneumonia. Electronically Signed   By: Marijo Conception M.D.   On: 11/13/2019 08:51   CXR am  Result Date: 11/08/2019 CLINICAL DATA:  Shortness of breath EXAM: PORTABLE CHEST 1 VIEW COMPARISON:  11/05/2019 FINDINGS: Bilateral opacities with a basilar predominance again identified. Aeration is slightly worse compared to the prior study. No pneumothorax. Cardiomediastinal contours are within normal limits. IMPRESSION: Persistent bilateral opacities with slight worsening of lung aeration compared to 11/05/2019. Electronically Signed   By: Macy Mis M.D.   On: 11/08/2019 08:47   DG Chest Port 1 View  Result Date: 11/05/2019 CLINICAL DATA:  Shortness of breath EXAM: PORTABLE CHEST 1 VIEW COMPARISON:  November 01, 2019 FINDINGS: Evaluation is limited due to low lung volumes. Bilateral pulmonary infiltrates persist. The infiltrate in the right base is more prominent the interval. The infiltrate on the left may be a little improved in the interval. No change in the cardiomediastinal silhouette. No pneumothorax. IMPRESSION: 1. Evaluation is limited due to the low volume technique. 2. The right basilar opacity is more focal in the interval. Left-sided infiltrate may be mildly improved in the interval. Recommend attention on follow-up. Electronically Signed   By: Dorise Bullion III M.D   On: 11/05/2019 09:42   DG Chest Port 1 View  Result Date: 11/07/2019 CLINICAL DATA:  Shortness of breath, worsening COVID symptoms, decreased O2 sats.  EXAM: PORTABLE CHEST 1 VIEW COMPARISON:  Chest radiograph 04/30/2017 FINDINGS: Heart size at the upper limits of normal. Ill-defined bilateral  airspace opacities with a mid to lower lung predominance. No sizable pleural effusion or evidence of pneumothorax. No acute bony abnormality. IMPRESSION: Bilateral ill-defined airspace opacities with a mid to lower lung predominance. Findings likely reflect atypical/viral pneumonia given provided history of COVID. Radiographic follow-up to resolution recommended. Electronically Signed   By: Kellie Simmering DO   On: 11/24/2019 08:33   VAS [place] LOWER EXTREMITY VENOUS (DVT)  Result Date: 11/13/2019  Lower Venous Study Indications: Elevated ddimer.  Comparison Study: no prior Performing Technologist: Abram Sander RVS  Examination Guidelines: A complete evaluation includes B-mode imaging, spectral Doppler, color Doppler, and power Doppler as needed of all accessible portions of each vessel. Bilateral testing is considered an integral part of a complete examination. Limited examinations for reoccurring indications may be performed as noted.  +---------+---------------+---------+-----------+----------+--------------+ RIGHT    CompressibilityPhasicitySpontaneityPropertiesThrombus Aging +---------+---------------+---------+-----------+----------+--------------+ CFV      Full           Yes      Yes                                 +---------+---------------+---------+-----------+----------+--------------+ SFJ      Full                                                        +---------+---------------+---------+-----------+----------+--------------+ FV Prox  Full                                                        +---------+---------------+---------+-----------+----------+--------------+ FV Mid   Full                                                        +---------+---------------+---------+-----------+----------+--------------+ FV DistalFull                                                        +---------+---------------+---------+-----------+----------+--------------+ PFV       Full                                                        +---------+---------------+---------+-----------+----------+--------------+ POP      Full           Yes      Yes                                 +---------+---------------+---------+-----------+----------+--------------+ PTV      Full                                                        +---------+---------------+---------+-----------+----------+--------------+  PERO     Full                                                        +---------+---------------+---------+-----------+----------+--------------+   +---------+---------------+---------+-----------+----------+--------------+ LEFT     CompressibilityPhasicitySpontaneityPropertiesThrombus Aging +---------+---------------+---------+-----------+----------+--------------+ CFV      Full           Yes      Yes                                 +---------+---------------+---------+-----------+----------+--------------+ SFJ      Full                                                        +---------+---------------+---------+-----------+----------+--------------+ FV Prox  Full                                                        +---------+---------------+---------+-----------+----------+--------------+ FV Mid   Full                                                        +---------+---------------+---------+-----------+----------+--------------+ FV DistalFull                                                        +---------+---------------+---------+-----------+----------+--------------+ PFV      Full                                                        +---------+---------------+---------+-----------+----------+--------------+ POP      Full           Yes      Yes                                 +---------+---------------+---------+-----------+----------+--------------+ PTV      Full                                                         +---------+---------------+---------+-----------+----------+--------------+ PERO     Full                                                        +---------+---------------+---------+-----------+----------+--------------+  Summary: Right: There is no evidence of deep vein thrombosis in the lower extremity. No cystic structure found in the popliteal fossa. Left: There is no evidence of deep vein thrombosis in the lower extremity. No cystic structure found in the popliteal fossa.  *See table(s) above for measurements and observations. Electronically signed by Monica Martinez MD on 11/13/2019 at 3:58:37 PM.    Final    [place] EKG SITE RITE  Result Date: 11/13/2019 If Site Rite image not attached, placement could not be confirmed due to current cardiac rhythm.   Microbiology Recent Results (from the past 240 hour(s))  MRSA PCR Screening     Status: None   Collection Time: 11/08/19 12:42 PM   Specimen: Nasal Mucosa; Nasopharyngeal  Result Value Ref Range Status   MRSA by PCR NEGATIVE NEGATIVE Final    Comment:        The GeneXpert MRSA Assay (FDA approved for NASAL specimens only), is one component of a comprehensive MRSA colonization surveillance program. It is not intended to diagnose MRSA infection nor to guide or monitor treatment for MRSA infections. Performed at [facility], [place] [address] [place], [place] [address]     Lab Basic Metabolic Panel: Recent Labs  Lab 11/11/19 0004 11/12/19 0101 11/13/19 0430 11/13/19 0430 11/14/19 0931 11/15/19 0605 11/16/19 0501 11/16/19 2221 11/16/19 2305  NA 142   < > 143   < > 144 146* 145 141 142  K 4.7   < > 5.3*   < > 4.5 4.3 4.8 4.6 5.2*  CL 111   < > 112*  --  111 114* 113*  --  110  CO2 20*   < > 20*  --  21* 20* 21*  --  16*  GLUCOSE 148*   < > 203*  --  171* 172* 242*  --  194*  BUN 41*   < > 68*  --  82* 78* 85*  --  114*  CREATININE 1.44*   < > 1.44*  --  2.04* 1.67* 1.69*  --   2.25*  CALCIUM 8.4*   < > 8.1*  --  7.9* 8.0* 8.3*  --  7.7*  MG 3.0*  --  2.5*  --   --   --   --   --   --   PHOS  --   --   --   --  4.9*  --   --   --   --    < > = values in this interval not displayed.   Liver Function Tests: Recent Labs  Lab 11/11/19 0004 11/12/19 0101 11/14/19 0931 11/15/19 0605 11/16/19 0501  AST 30 31  --  50* 66*  ALT 40 44  --  48* 54*  ALKPHOS 104 93  --  97 112  BILITOT 1.0 0.8  --  1.1 1.3*  PROT 6.3* 5.8*  --  5.9* 5.6*  ALBUMIN 3.1* 2.8* 3.0* 3.0* 3.0*   No results for input(s): LIPASE, AMYLASE in the last 168 hours. Recent Labs  Lab 11/15/19 0605  AMMONIA 58*   CBC: Recent Labs  Lab 11/11/19 0004 11/11/19 0004 11/12/19 0101 11/12/19 0101 11/13/19 0430 11/14/19 0931 11/15/19 0605 11/16/19 0501 11/16/19 2221  WBC 21.4*   < > 18.2*  --  24.5* 29.8* 30.7* 33.4*  --   NEUTROABS 19.5*  --   --   --   --   --   --   --   --  HGB 12.4*   < > 12.0*   < > 10.1* 10.5* 9.7* 8.7* 7.1*  HCT 40.4   < > 39.3   < > 32.9* 33.6* 32.0* 28.5* 21.0*  MCV 91.8   < > 91.8  --  92.7 95.2 98.2 101.1*  --   PLT PLATELET CLUMPS NOTED ON SMEAR, UNABLE TO ESTIMATE   < > 124*  --  131* 131* 117* 114*  --    < > = values in this interval not displayed.   Cardiac Enzymes: No results for input(s): CKTOTAL, CKMB, CKMBINDEX, TROPONINI in the last 168 hours. Sepsis Labs: Recent Labs  Lab 11/13/19 0430 11/14/19 0931 11/15/19 0605 11/16/19 0501 11/16/19 2305  PROCALCITON 0.30 0.26  --   --  0.52  WBC 24.5* 29.8* 30.7* 33.4*  --   LATICACIDVEN  --   --   --   --  6.8*     Shell Blanchette December 17, 2019, 9:06 AM

## 2019-11-28 NOTE — Progress Notes (Signed)
Levi Jones (wife) had visitation family time with pt. Wife verbalizes understanding of comfort measures.  Levi Jones (daughter) face-timed via phone with pt. Daughter verbalizes understanding of comfort measures.  Pt placed on morphine drip. Telemetry and vital sign monitors removed. Will continue to monitor pt on comfort measures.

## 2019-11-28 NOTE — Progress Notes (Signed)
Pt's blood pressure 88/46 with map of 59. Charge nurse notified. MD paged and sent texts via Epic communication log. Dr. Pennie Banter came and saw pt 0230. Pt has been switched over to comfort care. Pt will be placed on morphine drip. [name] (wife) notified of pt's status.

## 2019-11-28 NOTE — Progress Notes (Signed)
Rapid Response Event Note  Overview: Pt BP low and possibly septic with a lactic of 6.8. Pt lethargic all day per RN. RR in 30's PRN Morphine given. ABG done and pt switched to [place] and NRB    Initial Focused Assessment: Pt has received 1L bolus total of LR. SBP 80's. Pt still lethargic but arousable. Pt is belly breathing but states he is in no discomfort.    Interventions: MD paged for further orders. CXR placed. PRN albuterol given. Another 1L bolus of LR given. 1 amp of bicarb ordered   Plan of Care (if not transferred): Awaiting MD orders. Pt still on PCU floor. Will continue to follow.   Follow up: MD McQuaid at bedside and has elected along with family to make pt comfort care. PRN Morphine given again. Family called and family meeting set up.    Margaret Pyle

## 2019-11-28 NOTE — Progress Notes (Signed)
50 mg/50 mL morphine wasted with Freada Bergeron, RN

## 2019-11-28 NOTE — Progress Notes (Signed)
[facility]  Called emergently to bedside for hypotension, increasing work of breathing Chart reviewed, he was diagnosed on 12/25 Admitted 1/5, transferred here 1/8 Has been in and out of the ICU Remained on heated high flow for several days Made DNR after seeing palliative medicine Has been receiving aggressive treatment measures Tonight has been becoming more and more lethargic, hypotensive, and now worsening work of breathing with hypoxemia Minimally responsive on my exam, respirations ~40/min and very labored with accessory muscle use.  Lactic acid 7. Based on his underlying conditions, his current level of extremis, and duration of COVID, he will not survive this illness.  Will focus on comfort now, write order for morphine for relief of work of breathing, start morphine infusion Will notify his wife who wants to come to the bedside to be with him.  Cc time managing end of life issues 30 minutes  Roselie Awkward, MD [facility] Pager: [phone] Cell: [phone] If no response, call [phone]

## 2019-11-28 DEATH — deceased

## 2020-01-05 ENCOUNTER — Ambulatory Visit: Payer: Medicare Other | Admitting: *Deleted

## 2020-02-19 IMAGING — CT CT ANGIO CHEST
2 of 6 series · 18 of 46 positions shown · IV contrast (APPLIED)
Comparison: 07/27/2017

CLINICAL DATA: Shortness of breath, elevated D-dimer, hypoxia

EXAM:
CT ANGIOGRAPHY CHEST WITH CONTRAST
TECHNIQUE: Multidetector CT imaging of the chest was performed using the
standard protocol during bolus administration of intravenous
contrast. Multiplanar CT image reconstructions and MIPs were
obtained to evaluate the vascular anatomy.
CONTRAST:  100mL OMNIPAQUE IOHEXOL 350 MG/ML SOLN

[Series 5: thins · axial · 0.71mm/px · z∈[-340,-67]mm · 16 of 301 slices shown]
[im 14/301  lung]
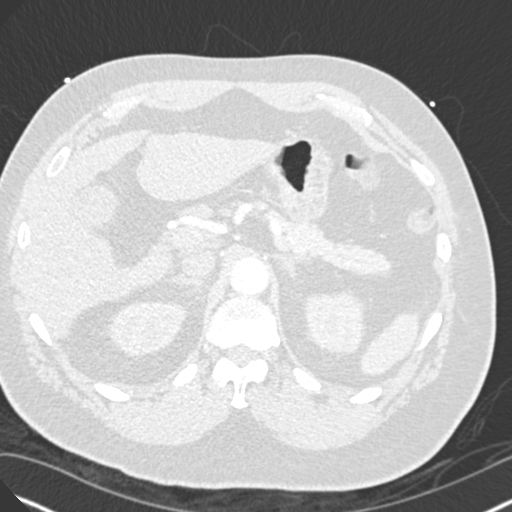
[im 40/301  soft-tissue]
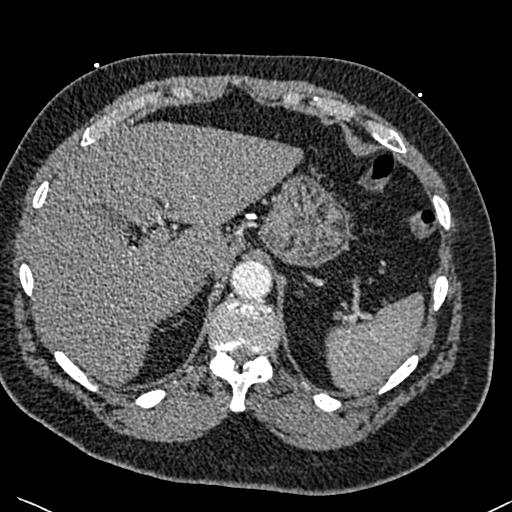
[im 53/301  lung]
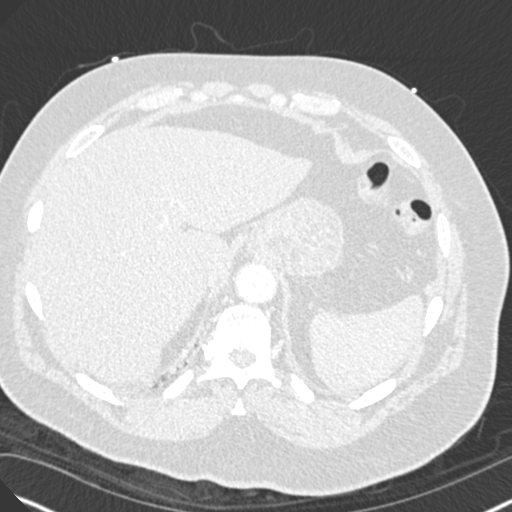
[im 66/301  soft-tissue]
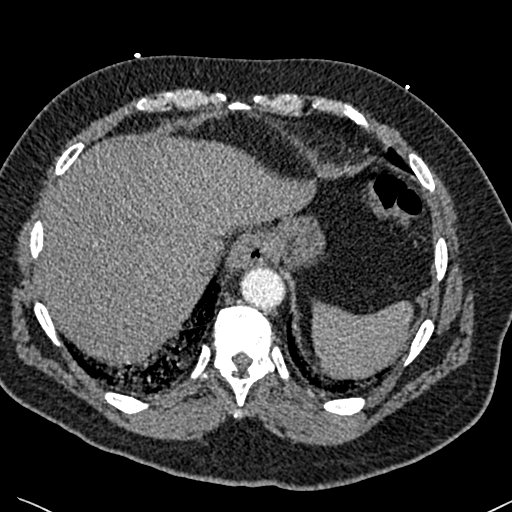
[im 92/301  lung]
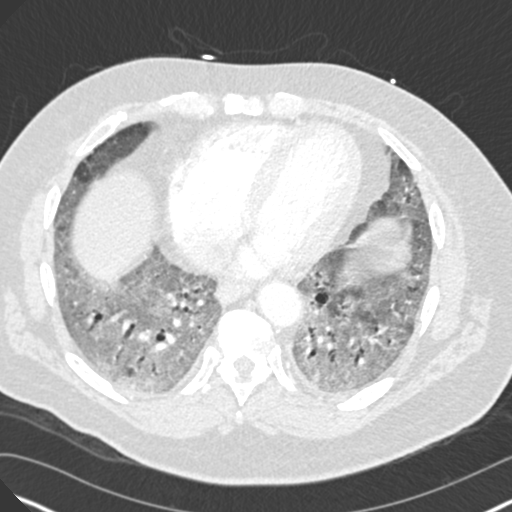
[im 105/301  soft-tissue]
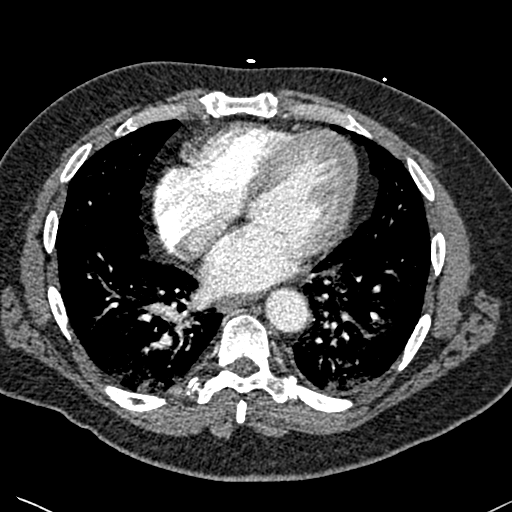
[im 118/301  lung]
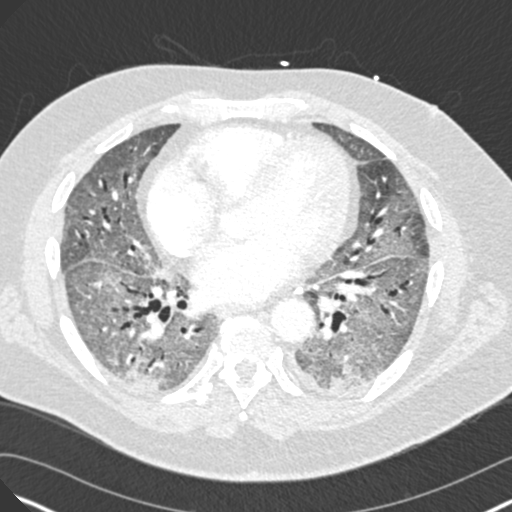
[im 144/301  soft-tissue]
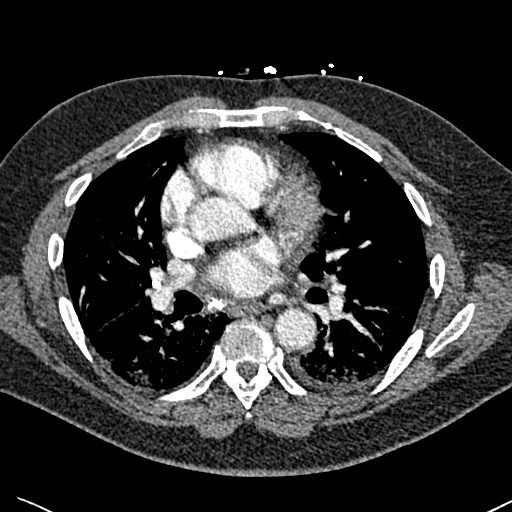
[im 157/301  lung]
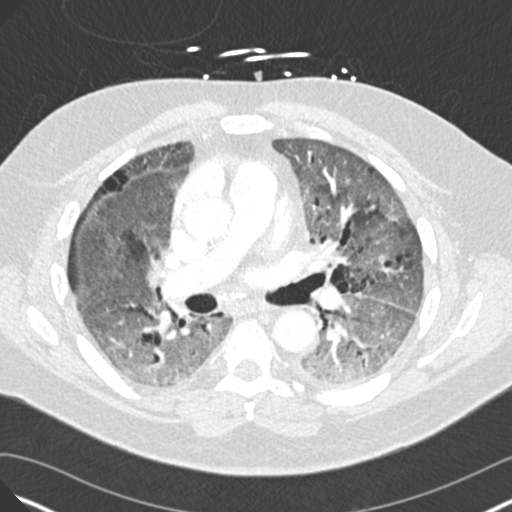
[im 183/301  soft-tissue]
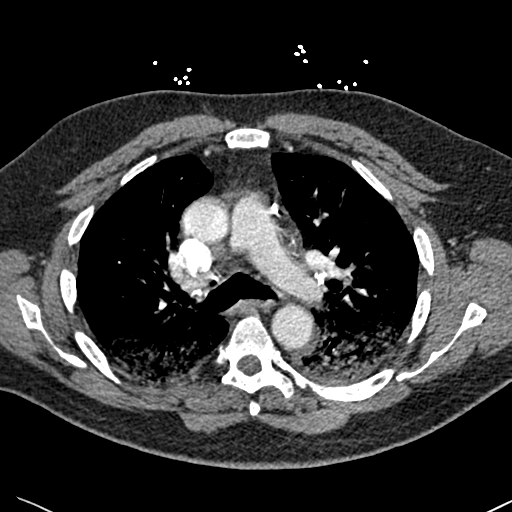
[im 196/301  lung]
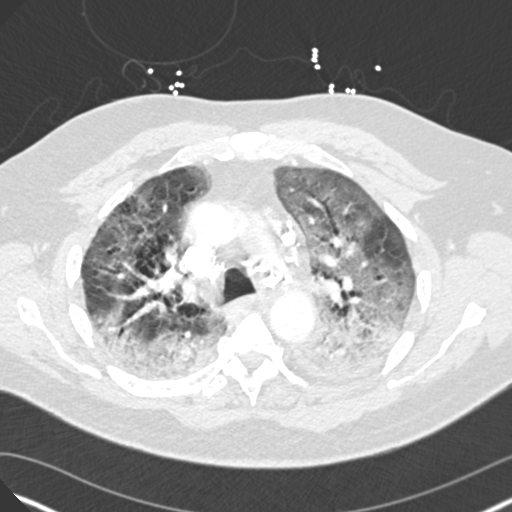
[im 209/301  soft-tissue]
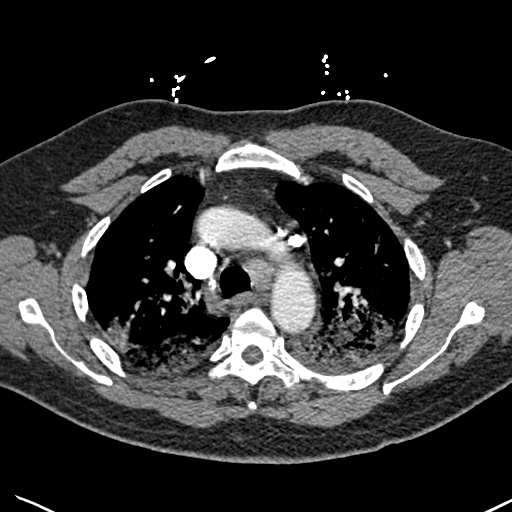
[im 235/301  lung]
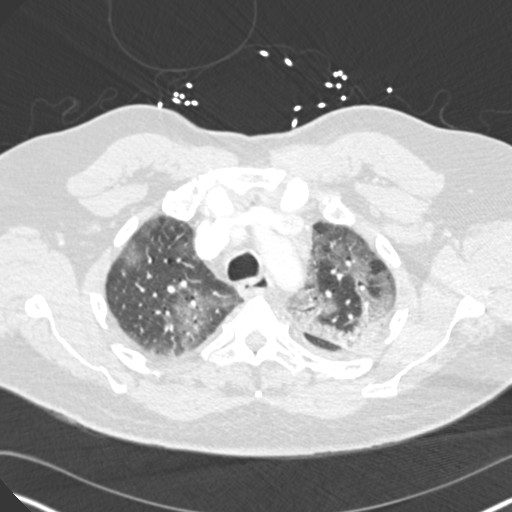
[im 248/301  soft-tissue]
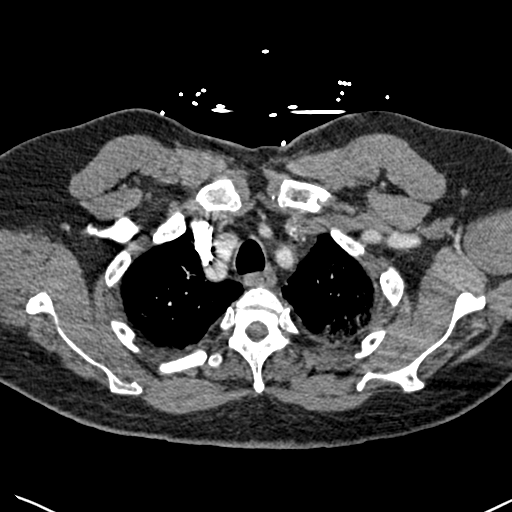
[im 261/301  lung]
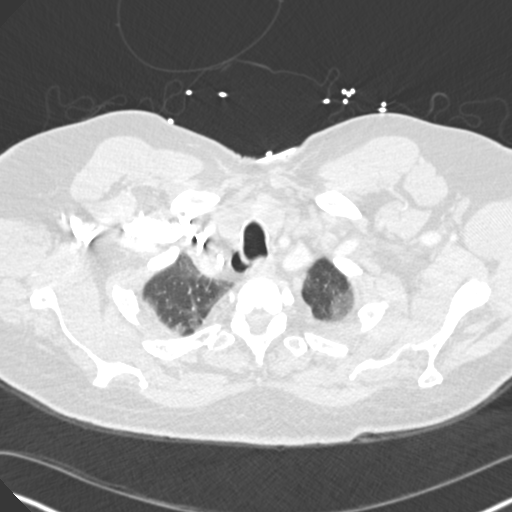
[im 287/301  soft-tissue]
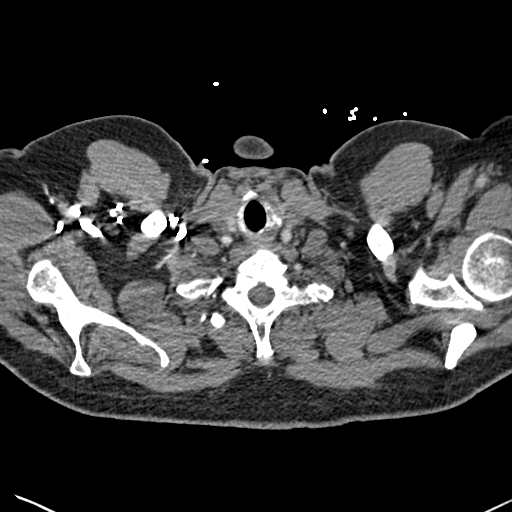

[Series 7: coronal mpr · coronal · 0.64mm/px · 2 of 96 slices shown]
[im 32/96  soft-tissue]
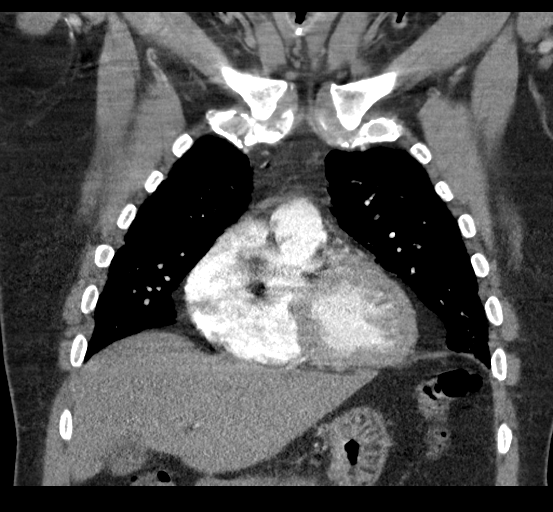
[im 64/96  soft-tissue]
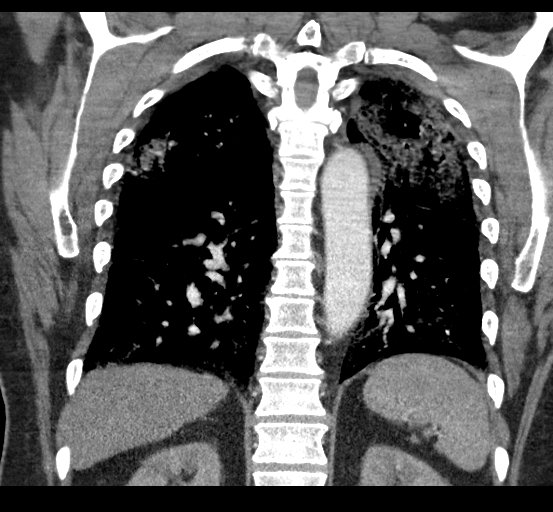

[18 of 46 positions shown; findings below may reference images not displayed]

FINDINGS: Cardiovascular: Examination for pulmonary embolism is somewhat
limited by breath motion artifact, particularly in the lower lobes.
Within this limitation, no evidence of pulmonary embolism through
the segmental pulmonary arterial level. Normal heart size. No
pericardial effusion.

Mediastinum/Nodes: Numerous calcified bilateral hilar and
mediastinal lymph nodes. Thyroid gland, trachea, and esophagus
demonstrate no significant findings.

Lungs/Pleura: Extensive bilateral heterogeneous and ground-glass
airspace opacity. Fibrotic change of the perihilar upper lobes
(series 6, image 65).

Upper Abdomen: No acute abnormality. Somewhat coarse appearing
contour of the liver in the included upper abdomen.

Musculoskeletal: No chest wall abnormality. No acute or significant
osseous findings.

Review of the MIP images confirms the above findings.
IMPRESSION: 1. Examination for pulmonary embolism is somewhat limited by breath
motion artifact, particularly in the lower lobes. Within this
limitation, no evidence of pulmonary embolism through the segmental
pulmonary arterial level.
2. Extensive bilateral heterogeneous and ground-glass airspace
opacity. Findings are consistent with multifocal infection, edema,
and/or ARDS.
3. Fibrotic change of the perihilar upper lobes, combined with
numerous calcified bilateral hilar and mediastinal lymph nodes,
constellation of findings generally consistent with fibrotic
pulmonary sarcoidosis and similar to appearance on prior chest
radiographs dated 07/27/2017.
[DATE]. Somewhat coarse appearing contour of the liver in the included
upper abdomen, suggestive of cirrhosis.

## 2020-02-21 IMAGING — DX DG CHEST 1V PORT
1 series · 1 of 1 positions shown · non-contrast
Comparison: November 01, 2019

CLINICAL DATA: Shortness of breath

EXAM:
PORTABLE CHEST 1 VIEW

[chest]
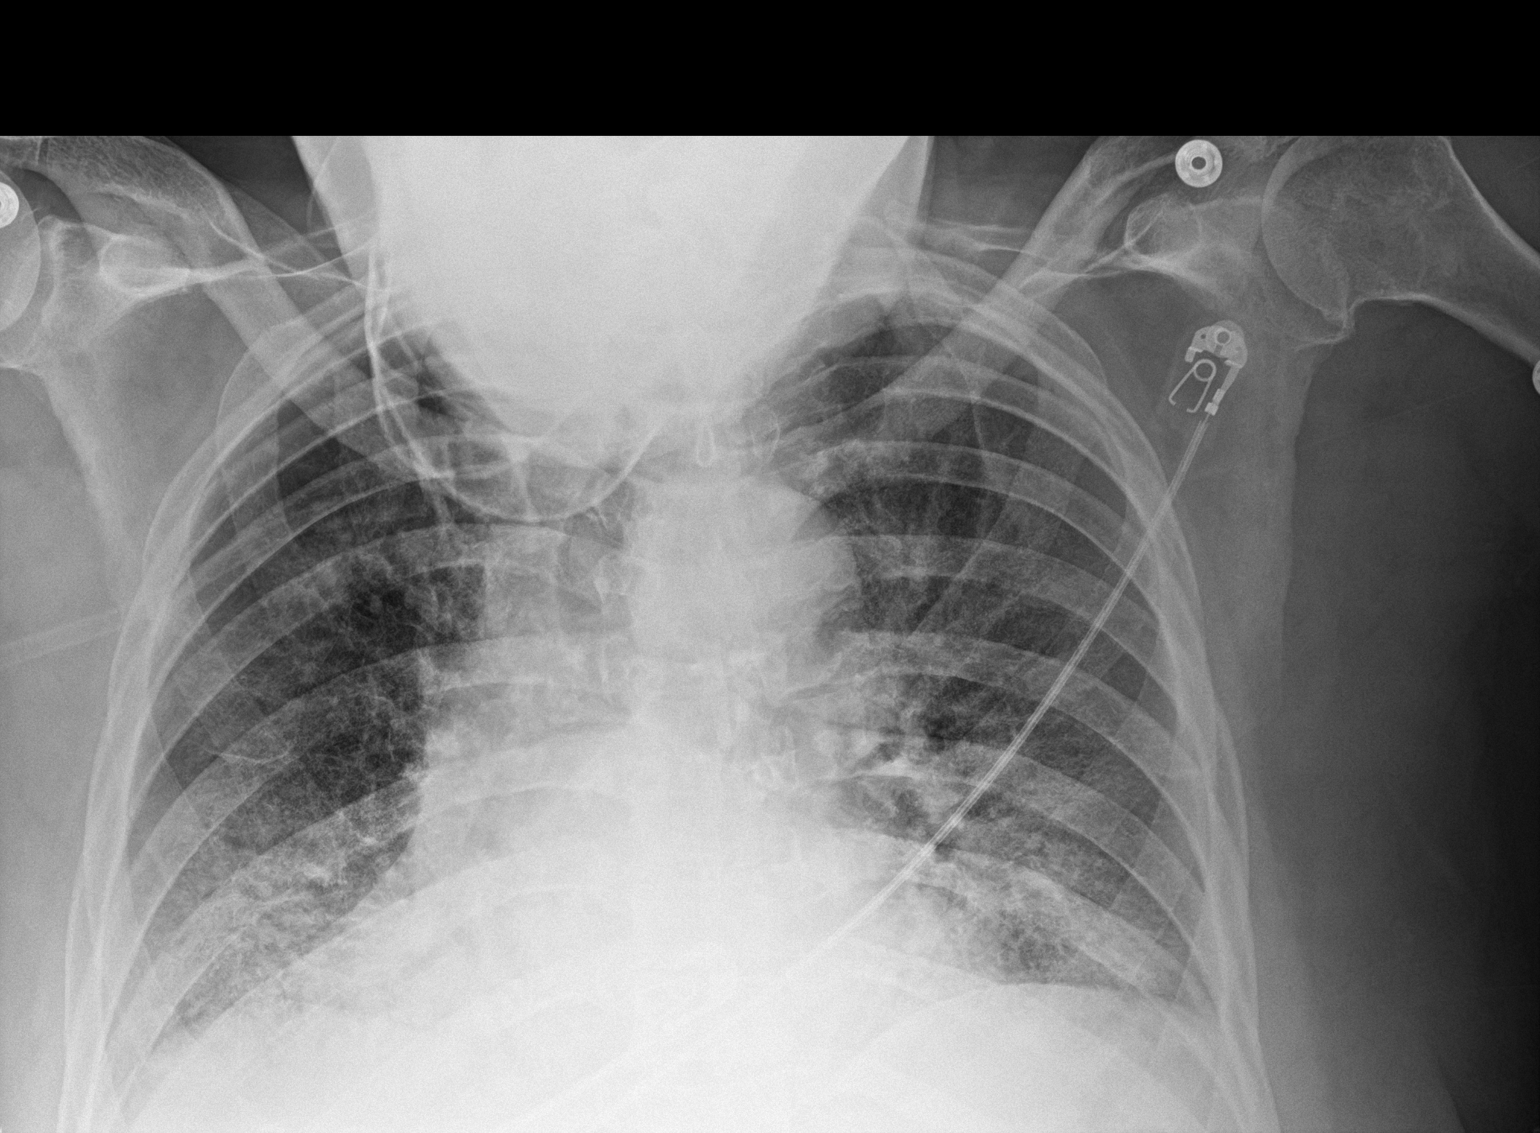

[1 of 1 positions shown; findings below may reference images not displayed]

FINDINGS: Evaluation is limited due to low lung volumes. Bilateral pulmonary
infiltrates persist. The infiltrate in the right base is more
prominent the interval. The infiltrate on the left may be a little
improved in the interval. No change in the cardiomediastinal
silhouette. No pneumothorax.
IMPRESSION: 1. Evaluation is limited due to the low volume technique.
2. The right basilar opacity is more focal in the interval.
Left-sided infiltrate may be mildly improved in the interval.
Recommend attention on follow-up.

## 2020-03-28 ENCOUNTER — Ambulatory Visit: Payer: Medicare Other | Admitting: Internal Medicine
